# Patient Record
Sex: Female | Born: 1945
Health system: Southern US, Community
[De-identification: ages and names within clinical notes are randomized; demographics above are authoritative.]

## PROBLEM LIST (undated history)

## (undated) DIAGNOSIS — Z5189 Encounter for other specified aftercare: Secondary | ICD-10-CM

## (undated) DIAGNOSIS — M810 Age-related osteoporosis without current pathological fracture: Secondary | ICD-10-CM

## (undated) DIAGNOSIS — Z860100 Personal history of colon polyps, unspecified: Secondary | ICD-10-CM

## (undated) DIAGNOSIS — M1990 Unspecified osteoarthritis, unspecified site: Secondary | ICD-10-CM

## (undated) DIAGNOSIS — C4370 Malignant melanoma of unspecified lower limb, including hip: Secondary | ICD-10-CM

## (undated) DIAGNOSIS — T7840XA Allergy, unspecified, initial encounter: Secondary | ICD-10-CM

## (undated) DIAGNOSIS — K219 Gastro-esophageal reflux disease without esophagitis: Secondary | ICD-10-CM

## (undated) HISTORY — DX: Personal history of colonic polyps: Z86.010

## (undated) HISTORY — DX: Personal history of colon polyps, unspecified: Z86.0100

## (undated) HISTORY — PX: OTHER SURGICAL HISTORY: SHX169

## (undated) HISTORY — DX: Allergy, unspecified, initial encounter: T78.40XA

## (undated) HISTORY — DX: Encounter for other specified aftercare: Z51.89

## (undated) HISTORY — DX: Age-related osteoporosis without current pathological fracture: M81.0

## (undated) HISTORY — DX: Malignant melanoma of unspecified lower limb, including hip: C43.70

## (undated) HISTORY — PX: TOTAL ABDOMINAL HYSTERECTOMY W/ BILATERAL SALPINGOOPHORECTOMY: SHX83

## (undated) HISTORY — DX: Gastro-esophageal reflux disease without esophagitis: K21.9

## (undated) HISTORY — DX: Unspecified osteoarthritis, unspecified site: M19.90

---

## 2009-12-05 ENCOUNTER — Ambulatory Visit: Payer: Self-pay | Admitting: Family Medicine

## 2009-12-05 ENCOUNTER — Encounter: Payer: Self-pay | Admitting: Family Medicine

## 2009-12-05 DIAGNOSIS — Z860100 Personal history of colon polyps, unspecified: Secondary | ICD-10-CM | POA: Insufficient documentation

## 2009-12-05 DIAGNOSIS — IMO0002 Reserved for concepts with insufficient information to code with codable children: Secondary | ICD-10-CM | POA: Insufficient documentation

## 2009-12-05 DIAGNOSIS — Z85828 Personal history of other malignant neoplasm of skin: Secondary | ICD-10-CM | POA: Insufficient documentation

## 2009-12-05 DIAGNOSIS — K219 Gastro-esophageal reflux disease without esophagitis: Secondary | ICD-10-CM | POA: Insufficient documentation

## 2009-12-10 ENCOUNTER — Ambulatory Visit: Payer: Self-pay | Admitting: Family Medicine

## 2009-12-10 LAB — CONVERTED CEMR LAB
ALT: 16 units/L (ref 0–35)
BUN: 21 mg/dL (ref 6–23)
Bilirubin, Direct: 0.1 mg/dL (ref 0.0–0.3)
Calcium: 9.9 mg/dL (ref 8.4–10.5)
Cholesterol: 186 mg/dL (ref 0–200)
Creatinine, Ser: 0.8 mg/dL (ref 0.4–1.2)
Eosinophils Relative: 4.6 % (ref 0.0–5.0)
GFR calc non Af Amer: 78.78 mL/min (ref >60)
HDL: 51.2 mg/dL (ref >39.00)
LDL Cholesterol: 114 mg/dL — ABNORMAL HIGH (ref 0–99)
MCV: 94.6 fL (ref 78.0–100.0)
Monocytes Absolute: 0.4 10*3/uL (ref 0.1–1.0)
Monocytes Relative: 8.5 % (ref 3.0–12.0)
Neutrophils Relative %: 49.6 % (ref 43.0–77.0)
Platelets: 253 10*3/uL (ref 150.0–400.0)
Total Bilirubin: 0.4 mg/dL (ref 0.3–1.2)
Triglycerides: 103 mg/dL (ref 0.0–149.0)
VLDL: 20.6 mg/dL (ref 0.0–40.0)
WBC: 4.9 10*3/uL (ref 4.5–10.5)

## 2009-12-13 ENCOUNTER — Encounter: Payer: Self-pay | Admitting: Family Medicine

## 2009-12-18 ENCOUNTER — Telehealth (INDEPENDENT_AMBULATORY_CARE_PROVIDER_SITE_OTHER): Payer: Self-pay | Admitting: *Deleted

## 2009-12-31 ENCOUNTER — Encounter (INDEPENDENT_AMBULATORY_CARE_PROVIDER_SITE_OTHER): Payer: Self-pay | Admitting: *Deleted

## 2010-03-26 NOTE — Miscellaneous (Signed)
  Clinical Lists Changes  Observations: Added new observation of BONE DENSITY: osteoporosis (12/17/2009 9:09) Added new observation of MAMMOGRAM: normal (12/05/2009 9:09)      Preventive Care Screening  Bone Density:    Date:  12/17/2009    Results:  osteoporosis  Mammogram:    Date:  12/05/2009    Results:  normal

## 2010-03-26 NOTE — Progress Notes (Signed)
Summary: bone density  Phone Note Outgoing Call   Call placed by: Doristine Devoid CMA,  December 18, 2009 2:23 PM Call placed to: Patient Summary of Call: recieved copy of bone density report from [facility] and it's indicated that patient has osteoporosis and needs to start Fosamax 70mg  weekly and continue caltrate daily.  left message on machine ........Marland KitchenDoristine Devoid CMA  December 18, 2009 2:25 PM   Follow-up for Phone Call        spoke w/ patient isn't comfortable w/ taking to fosamax would like to try another option.......Marland KitchenDoristine Devoid CMA  December 18, 2009 3:48 PM   Additional Follow-up for Phone Call Additional follow up Details #1::        could do reclast if pt is interested...if she has concerns we can talk about this at an OV Additional Follow-up by: Neena Rhymes MD,  December 19, 2009 8:04 AM    Additional Follow-up for Phone Call Additional follow up Details #2::    spoke w/ patient is interested in doing reclast says that she will check with insurance to see what coverage information is and contact our office to proceed.........Marland KitchenDoristine Devoid CMA  December 19, 2009 10:47 AM

## 2010-03-26 NOTE — Assessment & Plan Note (Signed)
Summary: NEW TO ESTAB/CBS  Flu Vaccine Consent Questions     Do you have a history of severe allergic reactions to this vaccine? no    Any prior history of allergic reactions to egg and/or gelatin? no    Do you have a sensitivity to the preservative Thimersol? no    Do you have a past history of Guillan-Barre Syndrome? no    Do you currently have an acute febrile illness? no    Have you ever had a severe reaction to latex? no    Vaccine information given and explained to patient? yes    Are you currently pregnant? no    Lot Number:[id number]   Exp Date:08/24/2010   Site Given  Right Deltoid IM     Vital Signs:  Patient profile:   65 year old female Height:      61.50 inches Weight:      148 pounds BMI:     27.61 Pulse rate:   78 / minute BP sitting:   120 / 82  (right arm)  Vitals Entered By: Doristine Devoid CMA (December 05, 2009 10:03 AM) CC: NEW EST- CPX AND LABS    History of Present Illness: 65 yo woman here today to establish care.  Derm- [facility].    Here for Medicare AWV:  1.   Risk factors based on Past M, S, F history: Glaucoma- taking Lumagan and Timolol, sees Dr Burgess Estelle w/ [facility] Dyspareunia- sxs were well controlled on hormone replacement but then was weaned.  now has vaginal dryness and bleeding w/ intercourse.  is 'all greased up' w/out relief. Melanoma- seeing derm once yearly. 2.   Physical Activities: walks dogs regularly 3.   Depression/mood: denies 4.   Hearing: normal to whispered voice 5.   ADL's: independent 6.   Fall Risk: not at risk 7.   Home Safety: safe at home 8.   Height, weight, &visual acuity: see vitals, vision corrected by ophtho 9.   Counseling: provided on diet and exercise 10.   Labs ordered based on risk factors: see A&P 11.           Referral Coordination: see A&P 12.           Care Plan: see A&P 13.           Cognitive Assessment- AAOx3, thought process linear, able to manage finances, memory intact.  Preventive  Screening-Counseling & Management  Alcohol-Tobacco     Alcohol drinks/day: 1     Alcohol type: wine     Smoking Status: never  Caffeine-Diet-Exercise     Does Patient Exercise: no      Sexual History:  currently monogamous.        Drug Use:  never.    Current Medications (verified): 1)  Osteo Bi-Flex Adv Triple St  Tabs (Misc Natural Products) .... Take Once Daily 2)  Citracal/vitamin D 250-200 Mg-Unit Tabs (Calcium Citrate-Vitamin D) .... Take One Tablet Daily 3)  Acidophilus  Tabs (Lactobacillus) .... Take Once Daily 4)  Fish Oil Triple Strength 1360 Mg Caps (Omega-3 Fatty Acids) .... Take One Tablet Daily 5)  Lumigan 0.03 % Soln (Bimatoprost) .... One Drop Each Eye Daily 6)  Timoptic 0.25 % Soln (Timolol Maleate) .[name].. 1 Drop in Left Eye 7)  Centrum Silver  Tabs (Multiple Vitamins-Minerals) .... Take One Tablet Daily  Allergies (verified): 1)  ! Amoxicillin  Past History:  Past Medical History: Skin cancer, hx of- melanoma on thigh GERD Glaucoma Colonic polyps, hx  of-   Past Surgical History: Melanoma removed Colon polyps removed  Hysterectomy Oophorectomy  Family History: CAD-no HTN-mother DM-no COLON CA-no STROKE-no BREAST CA-mother dx:83  Social History: Retired Married, daughter Never Smoked Alcohol use-yes Smoking Status:  never Does Patient Exercise:  no Sexual History:  currently monogamous Drug Use:  never  Review of Systems  The patient denies anorexia, fever, weight loss, weight gain, vision loss, decreased hearing, hoarseness, chest pain, syncope, dyspnea on exertion, peripheral edema, prolonged cough, headaches, abdominal pain, melena, hematochezia, severe indigestion/heartburn, hematuria, suspicious skin lesions, depression, abnormal bleeding, enlarged lymph nodes, and breast masses.    Physical Exam  General:  Well-developed,well-nourished,in no acute distress; alert,appropriate and cooperative throughout examination Head:   Normocephalic and atraumatic without obvious abnormalities. No apparent alopecia or balding. Eyes:  deferred to ophtho Ears:  External ear exam shows no significant lesions or deformities.  Otoscopic examination reveals clear canals, tympanic membranes are intact bilaterally without bulging, retraction, inflammation or discharge. Hearing is grossly normal bilaterally. Nose:  External nasal examination shows no deformity or inflammation. Nasal mucosa are pink and moist without lesions or exudates. Mouth:  Oral mucosa and oropharynx without lesions or exudates.  Teeth in good repair. Neck:  No deformities, masses, or tenderness noted. Breasts:  No mass, nodules, thickening, tenderness, bulging, retraction, inflamation, nipple discharge or skin changes noted.   Lungs:  Normal respiratory effort, chest expands symmetrically. Lungs are clear to auscultation, no crackles or wheezes. Heart:  Normal rate and regular rhythm. S1 and S2 normal without gallop, murmur, click, rub or other extra sounds. Abdomen:  Bowel sounds positive,abdomen soft and non-tender without masses, organomegaly or hernias noted. Genitalia:  vaginal atrophy.   Pulses:  +2 carotid, radial, DP Extremities:  No clubbing, cyanosis, edema, or deformity noted with normal full range of motion of all joints.   Neurologic:  No cranial nerve deficits noted. Station and gait are normal. Plantar reflexes are down-going bilaterally. DTRs are symmetrical throughout. Sensory, motor and coordinative functions appear intact. Skin:  Intact without suspicious lesions or rashes Cervical Nodes:  No lymphadenopathy noted Axillary Nodes:  No palpable lymphadenopathy Psych:  Cognition and judgment appear intact. Alert and cooperative with normal attention span and concentration. No apparent delusions, illusions, hallucinations   Impression & Recommendations:  Problem # 1:  PHYSICAL EXAMINATION (ICD-V70.0) Assessment New pt's PE WNL.  check labs to  screen for lipid d/o, DM, anemia, thyroid abnormalities.  check EKG as baseline.  anticipatory guidance provided.  will refer for DEXA and mammogram. Orders: Venipuncture (10272) TLB-Lipid Panel (80061-LIPID) TLB-BMP (Basic Metabolic Panel-BMET) (80048-METABOL) TLB-CBC Platelet - w/Differential (85025-CBCD) TLB-Hepatic/Liver Function Pnl (80076-HEPATIC) TLB-TSH (Thyroid Stimulating Hormone) (84443-TSH) EKG w/ Interpretation (93000) Welcome to Medicare, Physical (Z3664)  Problem # 2:  DYSPAREUNIA (ICD-625.0) Assessment: New most likely due to pt's vaginal atrophy.  start premarin cream.  reviewed side effects, including increased risk of breast cancer.  pt willing to accept these risks.  will follow.  Problem # 3:  SKIN CANCER, HX OF (ICD-V10.83) Assessment: New hx of melanoma.  following regularly w/ derm.  Complete Medication List: 1)  Osteo Bi-flex Adv Triple St Tabs (Misc natural products) .... Take once daily 2)  Citracal/vitamin D 250-200 Mg-unit Tabs (Calcium citrate-vitamin d) .... Take one tablet daily 3)  Acidophilus Tabs (Lactobacillus) .... Take once daily 4)  Fish Oil Triple Strength 1360 Mg Caps (Omega-3 fatty acids) .... Take one tablet daily 5)  Lumigan 0.03 % Soln (Bimatoprost) .... One drop each eye daily 6)  Timoptic 0.25 % Soln (Timolol maleate) .[name].. 1 drop in left eye 7)  Centrum Silver Tabs (Multiple vitamins-minerals) .... Take one tablet daily 8)  Premarin 0.625 Mg/gm Crea (Estrogens, conjugated) .... Apply 0.5 grams daily x2 weeks and then decrease to twice weekly.  disp 1 tube  Other Orders: Radiology Referral (Radiology) Flu Vaccine 56yrs + MEDICARE PATIENTS (G3875) Administration Flu vaccine - MCR (I4332)  Patient Instructions: 1)  Follow up in 2-3 months to let me know how the dryness is 2)  Use the Premarin as directed 3)  Someone will call you with your mammogram and bone density appts 4)  Keep up the good work!  You look great! 5)  We'll notify you  of your lab results 6)  Call with any questions or concerns 7)  Welcome!  We're glad to have you! Prescriptions: PREMARIN 0.625 MG/GM CREA (ESTROGENS, CONJUGATED) apply 0.5 grams daily x2 weeks and then decrease to twice weekly.  disp 1 tube  #1 x 3   Entered and Authorized by:   Neena Rhymes MD   Signed by:   Neena Rhymes MD on 12/05/2009   Method used:   Print then Give to Patient   RxID:   [id number]

## 2010-11-04 ENCOUNTER — Encounter: Payer: Self-pay | Admitting: Family Medicine

## 2010-11-04 ENCOUNTER — Ambulatory Visit (INDEPENDENT_AMBULATORY_CARE_PROVIDER_SITE_OTHER): Payer: Medicare Other | Admitting: Family Medicine

## 2010-11-04 DIAGNOSIS — Z23 Encounter for immunization: Secondary | ICD-10-CM

## 2010-11-04 DIAGNOSIS — IMO0002 Reserved for concepts with insufficient information to code with codable children: Secondary | ICD-10-CM

## 2010-11-04 DIAGNOSIS — G5790 Unspecified mononeuropathy of unspecified lower limb: Secondary | ICD-10-CM

## 2010-11-04 DIAGNOSIS — M542 Cervicalgia: Secondary | ICD-10-CM

## 2010-11-04 MED ORDER — CYCLOBENZAPRINE HCL 10 MG PO TABS: 10.0000 mg | ORAL_TABLET | Freq: Three times a day (TID) | ORAL | Status: DC | PRN

## 2010-11-04 MED ORDER — NAPROXEN 500 MG PO TABS: 500.0000 mg | ORAL_TABLET | Freq: Two times a day (BID) | ORAL | Status: DC

## 2010-11-04 NOTE — Progress Notes (Signed)
  Subjective:    Patient ID: Denise Ewing, female    DOB: June 14, 1945, 65 y.o.   MRN: [id number]  HPI Neck pain- started in February after flying to [place].  Went to [facility] and sxs did not improve.  Now they have worsened.  Pain is 'right here'- pt outlines trapezius muscles bilaterally.  Limited motion due to 'pulling'.  Burning pain- occuring in L foot, rarely in R foot.  Will occur after prolonged sitting/sleeping.  sxs are most noticeable on the tops of her feet.  Pain is not shooting from back or down back of thigh.  Dyspareunia- still having pain and bleeding despite using estrogen.  Wants to increase dose.   Review of Systems For ROS see HPI     Objective:   Physical Exam  Vitals reviewed. Constitutional: She is oriented to person, place, and time. She appears well-developed and well-nourished. No distress.  HENT:  Head: Normocephalic and atraumatic.       TMs normal bilaterally No TTP over sinuses  Eyes: Conjunctivae and EOM are normal. Pupils are equal, round, and reactive to light.  Musculoskeletal: She exhibits tenderness (over trapezesius bilaterally, + spasm).       (-) SLR bilaterally DTRs normal bilaterally Sensation of LEs intact Gait normal Strength of LEs symmetric and normal  Neurological: She is alert and oriented to person, place, and time. She has normal reflexes. No cranial nerve deficit. Coordination normal.          Assessment & Plan:

## 2010-11-04 NOTE — Patient Instructions (Signed)
Schedule your complete physical for October- do not eat before this appt Start the Naprosyn- twice daily x10 days and then as needed- take w/ food to avoid upset stomach Use the flexeril before bed Use a heating pad for pain relief Get a massage if able If the pain in your feet is not better in 2 weeks- please call Call with any questions or concerns Hang in there!

## 2010-11-10 NOTE — Assessment & Plan Note (Signed)
Pt's sxs and PE consistent w/ bilateral trap spasm.  Start scheduled NSAIDs and muscle relaxers.  Heat and massage as able.  Reviewed supportive care and red flags that should prompt return.  Pt expressed understanding and is in agreement w/ plan.

## 2010-11-10 NOTE — Assessment & Plan Note (Signed)
Pain pt is describing is nerve related.  Since it occurs w/ prolonged sitting it sounds as if nerve is being compressed by position.  Pt to monitor sxs and if no improvement will refer for nerve conduction study.  Pt expressed understanding and is in agreement w/ plan.

## 2010-11-10 NOTE — Assessment & Plan Note (Signed)
Refer to gyn for complete evaluation and tx.  Hesitant to increase estrogen dose due to risk factors- including breast cancer.  Pt in agreement w/ referral.

## 2010-11-28 ENCOUNTER — Other Ambulatory Visit (HOSPITAL_COMMUNITY)
Admission: RE | Admit: 2010-11-28 | Discharge: 2010-11-28 | Disposition: A | Discharge status: Home / Self Care | Payer: Medicare Other | Source: Ambulatory Visit | Attending: Obstetrics and Gynecology | Admitting: Obstetrics and Gynecology

## 2010-11-28 ENCOUNTER — Other Ambulatory Visit: Payer: Self-pay | Admitting: Obstetrics and Gynecology

## 2010-11-28 DIAGNOSIS — Z124 Encounter for screening for malignant neoplasm of cervix: Secondary | ICD-10-CM | POA: Insufficient documentation

## 2010-12-23 ENCOUNTER — Ambulatory Visit (INDEPENDENT_AMBULATORY_CARE_PROVIDER_SITE_OTHER): Payer: Medicare Other | Admitting: Family Medicine

## 2010-12-23 ENCOUNTER — Encounter: Payer: Self-pay | Admitting: Family Medicine

## 2010-12-23 DIAGNOSIS — Z79899 Other long term (current) drug therapy: Secondary | ICD-10-CM

## 2010-12-23 DIAGNOSIS — R5381 Other malaise: Secondary | ICD-10-CM

## 2010-12-23 DIAGNOSIS — Z0000 Encounter for general adult medical examination without abnormal findings: Secondary | ICD-10-CM | POA: Insufficient documentation

## 2010-12-23 DIAGNOSIS — K219 Gastro-esophageal reflux disease without esophagitis: Secondary | ICD-10-CM

## 2010-12-23 DIAGNOSIS — R5383 Other fatigue: Secondary | ICD-10-CM | POA: Insufficient documentation

## 2010-12-23 LAB — BASIC METABOLIC PANEL
Chloride: 103 mEq/L (ref 96–112)
GFR: 72.09 mL/min (ref >60.00)
Glucose, Bld: 98 mg/dL (ref 70–99)
Potassium: 4.5 mEq/L (ref 3.5–5.1)
Sodium: 138 mEq/L (ref 135–145)

## 2010-12-23 LAB — LIPID PANEL
LDL Cholesterol: 123 mg/dL — ABNORMAL HIGH (ref 0–99)
Total CHOL/HDL Ratio: 4
Triglycerides: 121 mg/dL (ref 0.0–149.0)

## 2010-12-23 LAB — CBC WITH DIFFERENTIAL/PLATELET
Eosinophils Absolute: 0.2 10*3/uL (ref 0.0–0.7)
Eosinophils Relative: 4.2 % (ref 0.0–5.0)
Lymphocytes Relative: 33.5 % (ref 12.0–46.0)
MCV: 92.3 fl (ref 78.0–100.0)
Monocytes Absolute: 0.4 10*3/uL (ref 0.1–1.0)
Neutrophils Relative %: 54.1 % (ref 43.0–77.0)
Platelets: 260 10*3/uL (ref 150.0–400.0)
WBC: 5.4 10*3/uL (ref 4.5–10.5)

## 2010-12-23 LAB — HEPATIC FUNCTION PANEL
ALT: 17 U/L (ref 0–35)
AST: 59 U/L — ABNORMAL HIGH (ref 0–37)
Alkaline Phosphatase: 68 U/L (ref 39–117)
Bilirubin, Direct: 0 mg/dL (ref 0.0–0.3)
Total Protein: 7.3 g/dL (ref 6.0–8.3)

## 2010-12-23 NOTE — Patient Instructions (Signed)
You look great!  Keep up the good work! We'll notify you of your lab results Call your insurance and ask about the shingles vaccine Call with any questions or concerns Happy Holidays!

## 2010-12-23 NOTE — Progress Notes (Signed)
  Subjective:    Patient ID: Denise Ewing, female    DOB: 1945/12/08, 65 y.o.   MRN: [id number]  HPI Here today for CPE.  Risk Factors: GERD- reports sxs are excellently controlled on Prilosec Fatigue- increased recently, would like to have B12 level checked.  Reports AM fatigue, afternoon fatigue.  Denies change in sleeping habits. Physical Activity: very active, does treadmill daily Fall Risk: low risk Depression: denies depression Hearing: normal to conversational tones, whispered voice ADL's: indpendent Cognitive: memory intact, linear thought process, normal attention span Home Safety: safe at home, lives w/ husband. Height, Weight, BMI, Visual Acuity: see vitals, vision corrected to 20/20 w/ glasses Counseling: UTD on mammo, DEXA, GYN exam [facility]), colonoscopy (2009) Labs Ordered: See A&P Care Plan: See A&P    Review of Systems Patient reports no vision/ hearing changes, adenopathy,fever, weight change,  persistant/recurrent hoarseness , swallowing issues, chest pain, palpitations, edema, persistant/recurrent cough, hemoptysis, dyspnea (rest/exertional/paroxysmal nocturnal), gastrointestinal bleeding (melena, rectal bleeding), abdominal pain, significant heartburn, bowel changes, GU symptoms (dysuria, hematuria, incontinence), Gyn symptoms (abnormal  bleeding, pain),  syncope, focal weakness, memory loss, numbness & tingling, skin/hair/nail changes, abnormal bruising or bleeding, anxiety, or depression.     Objective:   Physical Exam  General Appearance:    Alert, cooperative, no distress, appears stated age  Head:    Normocephalic, without obvious abnormality, atraumatic  Eyes:    PERRL, conjunctiva/corneas clear, EOM's intact, fundi    benign, both eyes  Ears:    Normal TM's and external ear canals, both ears  Nose:   Nares normal, septum midline, mucosa normal, no drainage    or sinus tenderness  Throat:   Lips, mucosa, and tongue normal; teeth and gums normal  Neck:    Supple, symmetrical, trachea midline, no adenopathy;    Thyroid: no enlargement/tenderness/nodules  Back:     Symmetric, no curvature, ROM normal, no CVA tenderness  Lungs:     Clear to auscultation bilaterally, respirations unlabored  Chest Wall:    No tenderness or deformity   Heart:    Regular rate and rhythm, S1 and S2 normal, no murmur, rub   or gallop  Breast Exam:    Deferred to GYN  Abdomen:     Soft, non-tender, bowel sounds active all four quadrants,    no masses, no organomegaly  Genitalia:    Deferred to GYN  Rectal:    Extremities:   Extremities normal, atraumatic, no cyanosis or edema  Pulses:   2+ and symmetric all extremities  Skin:   Skin color, texture, turgor normal, no rashes or lesions  Lymph nodes:   Cervical, supraclavicular, and axillary nodes normal  Neurologic:   CNII-XII intact, normal strength, sensation and reflexes    throughout          Assessment & Plan:

## 2010-12-25 ENCOUNTER — Telehealth: Payer: Self-pay | Admitting: *Deleted

## 2010-12-25 LAB — VITAMIN D 1,25 DIHYDROXY: Vitamin D2 1, 25 (OH)2: <8 pg/mL

## 2010-12-25 NOTE — Telephone Encounter (Signed)
Called pt. Left message to contact office

## 2010-12-25 NOTE — Telephone Encounter (Signed)
Message copied by Ovidio Kin on Wed Dec 25, 2010  6:02 PM ------      Message from: Sheliah Hatch      Created: Mon Dec 23, 2010  9:04 PM       Labs look good w/ exception of mildly elevated liver function.  Please hold tylenol and ETOH x2 weeks and then repeat LFTs (dx abnormal liver fxn)

## 2010-12-27 ENCOUNTER — Encounter: Payer: Self-pay | Admitting: *Deleted

## 2010-12-28 NOTE — Assessment & Plan Note (Signed)
Chronic problem, well controlled on Omeprazole.  No changes.

## 2010-12-28 NOTE — Assessment & Plan Note (Signed)
Pt's PE WNL.  UTD on health maintenance.  Check labs.  Anticipatory guidance provided.  

## 2010-12-28 NOTE — Assessment & Plan Note (Signed)
Pt reports this is new.  Check labs to r/o thyroid, anemia, metabolic abnormality.  Will determine next step based on lab results

## 2011-01-02 ENCOUNTER — Other Ambulatory Visit: Payer: Self-pay | Admitting: Family Medicine

## 2011-01-02 MED ORDER — CYCLOBENZAPRINE HCL 10 MG PO TABS: 10.0000 mg | ORAL_TABLET | Freq: Three times a day (TID) | ORAL | Status: DC | PRN

## 2011-01-02 NOTE — Telephone Encounter (Signed)
Ok for #30 

## 2011-01-02 NOTE — Telephone Encounter (Signed)
Last OV 12-23-10 last refill 11-04-10

## 2011-01-02 NOTE — Telephone Encounter (Signed)
rx sent to pharmacy by e-script For flexerill

## 2011-01-09 ENCOUNTER — Other Ambulatory Visit: Payer: Self-pay | Admitting: Family Medicine

## 2011-01-09 DIAGNOSIS — Z0000 Encounter for general adult medical examination without abnormal findings: Secondary | ICD-10-CM

## 2011-01-10 ENCOUNTER — Other Ambulatory Visit (INDEPENDENT_AMBULATORY_CARE_PROVIDER_SITE_OTHER): Payer: Medicare Other

## 2011-01-10 DIAGNOSIS — Z0000 Encounter for general adult medical examination without abnormal findings: Secondary | ICD-10-CM

## 2011-01-10 LAB — HEPATIC FUNCTION PANEL
AST: 51 U/L — ABNORMAL HIGH (ref 0–37)
Albumin: 4.3 g/dL (ref 3.5–5.2)
Alkaline Phosphatase: 62 U/L (ref 39–117)
Total Protein: 7.2 g/dL (ref 6.0–8.3)

## 2011-01-10 NOTE — Progress Notes (Signed)
12  

## 2011-03-24 ENCOUNTER — Telehealth: Payer: Self-pay | Admitting: Family Medicine

## 2011-03-24 MED ORDER — CYCLOBENZAPRINE HCL 10 MG PO TABS: 10.0000 mg | ORAL_TABLET | Freq: Three times a day (TID) | ORAL | Status: DC | PRN

## 2011-03-24 NOTE — Telephone Encounter (Signed)
rx sent to pharmacy by e-script  

## 2011-03-24 NOTE — Telephone Encounter (Signed)
Refill- cyclobenzaprine 10mg  tablets. Take one tablet by mouth every 8 hrs as needed for muscle spasms. Qty 30 last fill 11.8.12

## 2011-08-04 ENCOUNTER — Telehealth: Payer: Self-pay | Admitting: Family Medicine

## 2011-08-04 NOTE — Telephone Encounter (Signed)
Refill: Cyclobenzaprine 10mg  tablets. Take 1 tablet by mouth every 8 hours as needed for muscle spasms. Qty 30. Last fill 03-24-11

## 2011-08-05 MED ORDER — CYCLOBENZAPRINE HCL 10 MG PO TABS: 10.0000 mg | ORAL_TABLET | Freq: Three times a day (TID) | ORAL | Status: DC | PRN

## 2011-08-05 NOTE — Telephone Encounter (Signed)
Ok for #30 

## 2011-08-05 NOTE — Telephone Encounter (Signed)
rx sent to pharmacy by e-script  

## 2011-08-05 NOTE — Telephone Encounter (Signed)
Last OV 12-23-11 last refill 03-24-11 #30 no refills

## 2011-08-29 ENCOUNTER — Ambulatory Visit (INDEPENDENT_AMBULATORY_CARE_PROVIDER_SITE_OTHER): Payer: Medicare Other | Admitting: Internal Medicine

## 2011-08-29 ENCOUNTER — Encounter: Payer: Self-pay | Admitting: Internal Medicine

## 2011-08-29 ENCOUNTER — Ambulatory Visit (HOSPITAL_BASED_OUTPATIENT_CLINIC_OR_DEPARTMENT_OTHER)
Admission: RE | Admit: 2011-08-29 | Discharge: 2011-08-29 | Disposition: A | Discharge status: Home / Self Care | Payer: Medicare Other | Source: Ambulatory Visit | Attending: Internal Medicine | Admitting: Internal Medicine

## 2011-08-29 VITALS — BP 138/82 | HR 83 | Temp 98.5°F | Wt 149.2 lb

## 2011-08-29 DIAGNOSIS — M51379 Other intervertebral disc degeneration, lumbosacral region without mention of lumbar back pain or lower extremity pain: Secondary | ICD-10-CM | POA: Insufficient documentation

## 2011-08-29 DIAGNOSIS — IMO0002 Reserved for concepts with insufficient information to code with codable children: Secondary | ICD-10-CM

## 2011-08-29 DIAGNOSIS — M5416 Radiculopathy, lumbar region: Secondary | ICD-10-CM

## 2011-08-29 DIAGNOSIS — M25559 Pain in unspecified hip: Secondary | ICD-10-CM | POA: Insufficient documentation

## 2011-08-29 DIAGNOSIS — M5137 Other intervertebral disc degeneration, lumbosacral region: Secondary | ICD-10-CM | POA: Insufficient documentation

## 2011-08-29 MED ORDER — GABAPENTIN 100 MG PO CAPS: ORAL_CAPSULE | ORAL | Status: DC

## 2011-08-29 NOTE — Patient Instructions (Addendum)
The best exercises for the low back include freestyle swimming, stretch aerobics, and yoga.  Order for x-rays entered into  the computer; these will be performed at  [facility]. No appointment is necessary.    Please try to go on My Chart within the next 24 hours to allow me to release the results directly to you.

## 2011-08-29 NOTE — Progress Notes (Signed)
Subjective:    Patient ID: Denise Ewing, female    DOB: 1945/07/19, 66 y.o.   MRN: [id number]  HPI One month ago she began to have pain in the left hip, aching to sharp especially climbing stairs. There was no associated trauma or trigger for the back pain. In the last several days she has had sharp pain in the left inguinal area with ambulation. Sitting or leaning forward may be of some benefit. She had similar symptoms in the back in 2009 prior to having a benign tumor removed from the uterus. At that time showed total bowel hysterectomy and bilateral salpingo-oophorectomy.       Review of Systems Fecal/urinary incontinence: no Nmbness/Weakness: She has intermittent tingling in the left thigh or in the left posterior calf. Fever/chills/sweats: no Night pain: occasionally, but the pain is mainly with ambulation Weight loss: up 5# h/o cancer/immunosuppression:melanoma in situ R thigh; fibroid was benign PMH of osteoporosis or chronic steroid use:no       Objective:   Physical Exam Gen.: Healthy and well-nourished in appearance. Alert, appropriate and cooperative throughout exam. Head: Normocephalic without obvious abnormalities Neck: No deformities, masses, or tenderness noted. Range of motion normal Lungs: Normal respiratory effort; chest expands symmetrically. Lungs are clear to auscultation without rales, wheezes, or increased work of breathing. Heart: Normal rate and rhythm. Normal S1 and S2. No gallop, click, or rub.Grade 1/2 over 6 systolic murmur  Abdomen: Bowel sounds normal; abdomen soft and nontender. No masses, organomegaly or hernias noted.                                                     Musculoskeletal/extremities: No deformity or scoliosis noted of  the thoracic or lumbar spine. No clubbing, cyanosis, edema noted. Range of motion  normal .Tone & strength  normal. She has minor DIP finger changes; bunion is present at the base of the right great toe. Nail health  good.  She is able to lie flat and sit up without help. Straight leg raising is normal bilaterally to 90 Vascular: Carotid, radial artery, dorsalis pedis and  posterior tibial pulses are full and equal. No bruits present. Neurologic: Alert and oriented x3. Deep tendon reflexes symmetrical and normal.  Gait is normal to include tiptoe and heel walking        Skin: Intact without suspicious lesions or rashes. There is a well-healed scar over the right anterior thigh Lymph: No cervical, axillary, or L inguinal lymphadenopathy present. Psych: Mood and affect are normal. Normally interactive                                                                                       Assessment & Plan:  #1 back and hip pain; exam mitigate against acute disc or hip issues. The history suggests an L1-L2 radiculopathy. The cath symptoms would suggest an L 4-5 involvement. Past medical history of similar back pain with uterine fibroids. Status post total abdominal hysterectomy  #2 past medical history localized melanoma right  thigh  Plan: Lumbar films and  Gabapentin trial

## 2011-09-05 ENCOUNTER — Encounter: Payer: Self-pay | Admitting: Family Medicine

## 2011-09-05 ENCOUNTER — Ambulatory Visit (INDEPENDENT_AMBULATORY_CARE_PROVIDER_SITE_OTHER): Payer: Medicare Other | Admitting: Family Medicine

## 2011-09-05 VITALS — BP 120/67 | HR 70 | Temp 97.7°F | Ht 62.0 in | Wt 148.0 lb

## 2011-09-05 DIAGNOSIS — Z23 Encounter for immunization: Secondary | ICD-10-CM

## 2011-09-05 DIAGNOSIS — IMO0002 Reserved for concepts with insufficient information to code with codable children: Secondary | ICD-10-CM

## 2011-09-05 DIAGNOSIS — M5416 Radiculopathy, lumbar region: Secondary | ICD-10-CM | POA: Insufficient documentation

## 2011-09-05 MED ORDER — NAPROXEN 500 MG PO TABS: 500.0000 mg | ORAL_TABLET | Freq: Two times a day (BID) | ORAL | Status: AC

## 2011-09-05 NOTE — Progress Notes (Signed)
  Subjective:    Patient ID: Denise Ewing, female    DOB: 1945-12-06, 66 y.o.   MRN: [id number]  HPI Groin pain- sxs started 1 month ago.  After prolonged sitting would stand and get pain in low back that would radiate down into leg.  Would hurt to walk but would then ease w/ continued walking.  Pain is now radiating into groin.  Described as sharp pain.  In groin.  Becoming more frequent.  Fears cancer recurrence- melanoma on thigh.  Started on Neurontin at last visit- 'takes it from a headache to a dull ache but it's still there'.  Had lumbar films showing some degenerative changes.  Has had intermittent numbness in L leg- radiating from hip to toes.  No incontinence.   Review of Systems For ROS see HPI     Objective:   Physical Exam  Vitals reviewed. Constitutional: She appears well-developed and well-nourished. No distress.  Cardiovascular: Intact distal pulses.   Musculoskeletal:       Good flexion and extension of spine + TTP over L SI joint + SLR on L, (-) on R Pain w/ external rotation of L hip, no pain w/ internal rotation, flexion/extension          Assessment & Plan:

## 2011-09-05 NOTE — Assessment & Plan Note (Signed)
New.  Recent xrays show degenerative changes of lumbar spine and pt w/ TTP over L SI joint.  neurontin has improved the neuropathic pain.  Start NSAIDs to decrease inflammation.  Refer to ortho for complete evaluation.  Reviewed supportive care and red flags that should prompt return.  Pt expressed understanding and is in agreement w/ plan.

## 2011-09-05 NOTE — Patient Instructions (Addendum)
Start the Naproxen twice daily x10 days (w/ food) to decrease inflammation Continue the neurontin We'll call you with your Ortho appt Call with any questions or concerns Hang in there!!!

## 2011-11-05 ENCOUNTER — Other Ambulatory Visit: Payer: Self-pay | Admitting: Family Medicine

## 2011-11-05 NOTE — Telephone Encounter (Signed)
Refill Cyclobenzaprine HCl (Tab) 10 MG Take 1 tablet (10 mg total) by mouth every 8 (eight) hours as needed for muscle spasms--last fill 6.11.13 #30 Last ov 7.12.13 acute

## 2011-11-06 MED ORDER — CYCLOBENZAPRINE HCL 10 MG PO TABS: 10.0000 mg | ORAL_TABLET | Freq: Three times a day (TID) | ORAL | Status: AC | PRN

## 2011-11-06 NOTE — Telephone Encounter (Signed)
rx sent to pharmacy by e-script  

## 2011-11-06 NOTE — Telephone Encounter (Signed)
Ok for #30, no refills 

## 2011-11-06 NOTE — Telephone Encounter (Signed)
Last OV 09-05-11 last refill 03-24-11 #30 no refills

## 2011-12-16 ENCOUNTER — Ambulatory Visit (INDEPENDENT_AMBULATORY_CARE_PROVIDER_SITE_OTHER): Payer: Medicare Other

## 2011-12-16 DIAGNOSIS — Z23 Encounter for immunization: Secondary | ICD-10-CM

## 2011-12-26 ENCOUNTER — Encounter: Payer: Self-pay | Admitting: Family Medicine

## 2012-02-06 ENCOUNTER — Encounter: Payer: Self-pay | Admitting: Family Medicine

## 2012-02-06 ENCOUNTER — Ambulatory Visit (INDEPENDENT_AMBULATORY_CARE_PROVIDER_SITE_OTHER): Payer: Medicare Other | Admitting: Family Medicine

## 2012-02-06 ENCOUNTER — Encounter: Payer: Self-pay | Admitting: Lab

## 2012-02-06 VITALS — BP 130/70 | HR 71 | Temp 98.2°F | Ht 61.25 in | Wt 148.4 lb

## 2012-02-06 DIAGNOSIS — Z0000 Encounter for general adult medical examination without abnormal findings: Secondary | ICD-10-CM | POA: Insufficient documentation

## 2012-02-06 DIAGNOSIS — R5381 Other malaise: Secondary | ICD-10-CM

## 2012-02-06 DIAGNOSIS — R5383 Other fatigue: Secondary | ICD-10-CM

## 2012-02-06 LAB — CBC WITH DIFFERENTIAL/PLATELET
Basophils Relative: 0.8 % (ref 0.0–3.0)
Eosinophils Absolute: 0.2 10*3/uL (ref 0.0–0.7)
Eosinophils Relative: 3.6 % (ref 0.0–5.0)
HCT: 40 % (ref 36.0–46.0)
Hemoglobin: 13.5 g/dL (ref 12.0–15.0)
Lymphs Abs: 2 10*3/uL (ref 0.7–4.0)
MCHC: 33.8 g/dL (ref 30.0–36.0)
MCV: 90.5 fl (ref 78.0–100.0)
Monocytes Absolute: 0.4 10*3/uL (ref 0.1–1.0)
Neutro Abs: 3.6 10*3/uL (ref 1.4–7.7)
Neutrophils Relative %: 56.9 % (ref 43.0–77.0)
RBC: 4.42 Mil/uL (ref 3.87–5.11)
WBC: 6.3 10*3/uL (ref 4.5–10.5)

## 2012-02-06 LAB — HEPATIC FUNCTION PANEL
AST: 43 U/L — ABNORMAL HIGH (ref 0–37)
Albumin: 4.8 g/dL (ref 3.5–5.2)
Total Bilirubin: 0.9 mg/dL (ref 0.3–1.2)

## 2012-02-06 LAB — LIPID PANEL
Cholesterol: 177 mg/dL (ref 0–200)
HDL: 46.4 mg/dL (ref >39.00)

## 2012-02-06 LAB — BASIC METABOLIC PANEL
BUN: 14 mg/dL (ref 6–23)
Creatinine, Ser: 0.8 mg/dL (ref 0.4–1.2)
GFR: 79.44 mL/min (ref >60.00)
Glucose, Bld: 105 mg/dL — ABNORMAL HIGH (ref 70–99)
Potassium: 3.9 mEq/L (ref 3.5–5.1)

## 2012-02-06 LAB — TSH: TSH: 2.4 u[IU]/mL (ref 0.35–5.50)

## 2012-02-06 NOTE — Assessment & Plan Note (Signed)
Pt's PE WNL.  UTD on health maintenance.  Check labs.  Anticipatory guidance provided.  

## 2012-02-06 NOTE — Progress Notes (Signed)
  Subjective:    Patient ID: Denise Ewing, female    DOB: 1946-02-10, 66 y.o.   MRN: [id number]  HPI Here today for CPE.  Risk Factors: GERD- chronic problem, well controlled on omeprazole daily. Lumbar back pain- chronic problem, dx'd w/ arthritis.  Will take NSAIDs and flexeril as needed.  Did PT w/out relief. Physical Activity: limited exercise, previously walked on the treadmill Fall Risk: low risk Depression: no current sxs Hearing: normal to conversational tones and whispered voice at 6 ft ADL's: independent Cognitive: normal linear thought process, memory and attention intact Home Safety: safe at home, lives w/ husband Height, Weight, BMI, Visual Acuity: see vitals, vision corrected to 20/20 w/ glasses Counseling: UTD on colonoscopy, mammo/pap. Labs Ordered: See A&P Care Plan: See A&P    Review of Systems Patient reports no vision/ hearing changes, adenopathy,fever, weight change,  persistant/recurrent hoarseness , swallowing issues, chest pain, palpitations, edema, persistant/recurrent cough, hemoptysis, dyspnea (rest/exertional/paroxysmal nocturnal), gastrointestinal bleeding (melena, rectal bleeding), abdominal pain, significant heartburn, bowel changes, GU symptoms (dysuria, hematuria, incontinence), Gyn symptoms (abnormal  bleeding, pain),  syncope, focal weakness, memory loss, numbness & tingling, skin/hair/nail changes, abnormal bruising or bleeding, anxiety, or depression.     Objective:   Physical Exam General Appearance:    Alert, cooperative, no distress, appears stated age  Head:    Normocephalic, without obvious abnormality, atraumatic  Eyes:    PERRL, conjunctiva/corneas clear, EOM's intact, fundi    benign, both eyes  Ears:    Normal TM's and external ear canals, both ears  Nose:   Nares normal, septum midline, mucosa normal, no drainage    or sinus tenderness  Throat:   Lips, mucosa, and tongue normal; teeth and gums normal  Neck:   Supple, symmetrical,  trachea midline, no adenopathy;    Thyroid: no enlargement/tenderness/nodules  Back:     Symmetric, no curvature, ROM normal, no CVA tenderness  Lungs:     Clear to auscultation bilaterally, respirations unlabored  Chest Wall:    No tenderness or deformity   Heart:    Regular rate and rhythm, S1 and S2 normal, no murmur, rub   or gallop  Breast Exam:    Deferred to GYN  Abdomen:     Soft, non-tender, bowel sounds active all four quadrants,    no masses, no organomegaly  Genitalia:    Deferred to GYN  Rectal:    Extremities:   Extremities normal, atraumatic, no cyanosis or edema  Pulses:   2+ and symmetric all extremities  Skin:   Skin color, texture, turgor normal, no rashes or lesions  Lymph nodes:   Cervical, supraclavicular, and axillary nodes normal  Neurologic:   CNII-XII intact, normal strength, sensation and reflexes    throughout          Assessment & Plan:

## 2012-02-06 NOTE — Patient Instructions (Addendum)
Follow up in 1 year or as needed We'll notify you of your lab results and make any changes if needed Keep up the good work!  You look great! Call with any questions or concerns Have a safe trip- Happy Holidays!!!

## 2012-02-11 LAB — VITAMIN D 1,25 DIHYDROXY
Vitamin D 1, 25 (OH)2 Total: 51 pg/mL (ref 18–72)
Vitamin D3 1, 25 (OH)2: 51 pg/mL

## 2012-03-29 ENCOUNTER — Telehealth: Payer: Self-pay | Admitting: Family Medicine

## 2012-03-29 NOTE — Telephone Encounter (Signed)
Message copied by Verner Chol on Mon Mar 29, 2012  4:09 PM ------      Message from: Elwin Sleight      Created: Fri Mar 26, 2012  3:20 PM      Regarding: RE: billing Issue      Contact: [phone]       Thank you      ----- Message -----         From: Theodis Sato McDaniels         Sent: 03/10/2012   3:02 PM           To: Elwin Sleight      Subject: billing Issue                                            Pt has been taking care of, she had questions as to why her [employer] would not pay for her Hepatic & TSH. Advised pt it was coded as V70-annual exam and she needs to call her insurance company to get a better understanding as to why they say these labs are NOT covered under her plan and then call billing off [phone]

## 2012-04-22 ENCOUNTER — Encounter: Payer: Self-pay | Admitting: Family Medicine

## 2012-07-09 ENCOUNTER — Encounter: Payer: Self-pay | Admitting: Family Medicine

## 2012-07-09 ENCOUNTER — Ambulatory Visit (INDEPENDENT_AMBULATORY_CARE_PROVIDER_SITE_OTHER): Payer: Medicare Other | Admitting: Family Medicine

## 2012-07-09 VITALS — BP 126/70 | HR 63 | Temp 98.1°F | Ht 61.5 in | Wt 146.8 lb

## 2012-07-09 DIAGNOSIS — R350 Frequency of micturition: Secondary | ICD-10-CM

## 2012-07-09 DIAGNOSIS — N390 Urinary tract infection, site not specified: Secondary | ICD-10-CM | POA: Insufficient documentation

## 2012-07-09 LAB — POCT URINALYSIS DIPSTICK
Glucose, UA: NEGATIVE
Ketones, UA: NEGATIVE
Protein, UA: NEGATIVE
Urobilinogen, UA: 0.2

## 2012-07-09 MED ORDER — SULFAMETHOXAZOLE-TRIMETHOPRIM 800-160 MG PO TABS: 1.0000 | ORAL_TABLET | Freq: Two times a day (BID) | ORAL | Status: DC

## 2012-07-09 NOTE — Patient Instructions (Addendum)
This is a UTI Start the Bactrim twice daily Drink plenty of fluids Call with any questions or concerns Hang in there!!!

## 2012-07-09 NOTE — Progress Notes (Signed)
  Subjective:    Patient ID: Denise Ewing, female    DOB: 1945-05-03, 67 y.o.   MRN: [id number]  HPI UTI- sxs started Wednesday w/ increased frequency and hesitancy.  + suprapubic pain and low back pain.  Started drinking Cranberry juice yesterday.  No fevers, nausea.  + dysuria.   Review of Systems For ROS see HPI     Objective:   Physical Exam  Vitals reviewed. Constitutional: She appears well-developed and well-nourished. No distress.  Abdominal: Soft. She exhibits no distension. There is no tenderness (no suprapubic or CVA tenderness).          Assessment & Plan:

## 2012-07-09 NOTE — Assessment & Plan Note (Signed)
New.  Pt's sxs and UA consistent w/ infxn.  Start abx.  Reviewed supportive care and red flags that should prompt return.  Pt expressed understanding and is in agreement w/ plan.  

## 2012-07-12 LAB — URINE CULTURE: Colony Count: >=100000

## 2012-11-30 ENCOUNTER — Ambulatory Visit (INDEPENDENT_AMBULATORY_CARE_PROVIDER_SITE_OTHER): Payer: Medicare Other

## 2012-11-30 DIAGNOSIS — Z23 Encounter for immunization: Secondary | ICD-10-CM

## 2012-12-27 LAB — HM MAMMOGRAPHY: HM Mammogram: NORMAL

## 2012-12-29 ENCOUNTER — Encounter: Payer: Self-pay | Admitting: Family Medicine

## 2013-02-07 ENCOUNTER — Telehealth: Payer: Self-pay

## 2013-02-07 NOTE — Telephone Encounter (Addendum)
Left message for call back Non identifiable Medication and allergies:  Reviewed and updated  90 day supply/mail order: na Local pharmacy: [facility] [address]   Immunizations due: Tdap and shingles  A/P:   No changes to FH or PSH or personal hx Flu vaccine--11/2012 PNA--08/2011 MMG--12/2012--no report CCS--02/2007 per patient PAP--11/2010--Eleanor Greene, MD  To Discuss with Provider: Mother passed in August

## 2013-02-08 ENCOUNTER — Encounter: Payer: Self-pay | Admitting: Family Medicine

## 2013-02-08 ENCOUNTER — Ambulatory Visit (INDEPENDENT_AMBULATORY_CARE_PROVIDER_SITE_OTHER): Payer: Medicare Other | Admitting: Family Medicine

## 2013-02-08 VITALS — BP 122/80 | HR 66 | Temp 98.1°F | Resp 16 | Ht 61.5 in | Wt 149.0 lb

## 2013-02-08 DIAGNOSIS — L259 Unspecified contact dermatitis, unspecified cause: Secondary | ICD-10-CM

## 2013-02-08 DIAGNOSIS — Z0000 Encounter for general adult medical examination without abnormal findings: Secondary | ICD-10-CM

## 2013-02-08 DIAGNOSIS — L309 Dermatitis, unspecified: Secondary | ICD-10-CM | POA: Insufficient documentation

## 2013-02-08 LAB — CBC WITH DIFFERENTIAL/PLATELET
Basophils Relative: 0.6 % (ref 0.0–3.0)
Eosinophils Absolute: 0.2 10*3/uL (ref 0.0–0.7)
Eosinophils Relative: 3.5 % (ref 0.0–5.0)
HCT: 38.4 % (ref 36.0–46.0)
Lymphs Abs: 1.5 10*3/uL (ref 0.7–4.0)
MCHC: 33 g/dL (ref 30.0–36.0)
MCV: 90.7 fl (ref 78.0–100.0)
Monocytes Absolute: 0.4 10*3/uL (ref 0.1–1.0)
Neutrophils Relative %: 60.2 % (ref 43.0–77.0)
RBC: 4.24 Mil/uL (ref 3.87–5.11)
WBC: 5.4 10*3/uL (ref 4.5–10.5)

## 2013-02-08 LAB — LIPID PANEL
Cholesterol: 178 mg/dL (ref 0–200)
HDL: 51.2 mg/dL (ref >39.00)
Triglycerides: 64 mg/dL (ref 0.0–149.0)
VLDL: 12.8 mg/dL (ref 0.0–40.0)

## 2013-02-08 LAB — BASIC METABOLIC PANEL
BUN: 17 mg/dL (ref 6–23)
Calcium: 9.2 mg/dL (ref 8.4–10.5)
Creatinine, Ser: 0.7 mg/dL (ref 0.4–1.2)
GFR: 88.4 mL/min (ref >60.00)
Glucose, Bld: 99 mg/dL (ref 70–99)

## 2013-02-08 LAB — HEPATIC FUNCTION PANEL
AST: 30 U/L (ref 0–37)
Albumin: 4.5 g/dL (ref 3.5–5.2)
Total Bilirubin: 0.6 mg/dL (ref 0.3–1.2)

## 2013-02-08 LAB — TSH: TSH: 1.64 u[IU]/mL (ref 0.35–5.50)

## 2013-02-08 NOTE — Progress Notes (Signed)
Pre visit review using our clinic review tool, if applicable. No additional management support is needed unless otherwise documented below in the visit note. 

## 2013-02-08 NOTE — Progress Notes (Signed)
   Subjective:    Patient ID: Denise Ewing, female    DOB: [birth year], 67 y.o.   MRN: [id number]  HPI Here today for CPE.  Risk Factors: Skin rash on breasts- started in March and appeared to be a bug bite.  Applied cortisone and it went away.  Now has one on tip of nipple on L breast, several lesions around it and now areas on R breast.  Has derm appt 1st week of Jan Physical Activity: exercising regularly Fall Risk: low risk Depression: no current sxs Hearing: normal to conversational tones and whispered voice ADL's: independent Cognitive: normal linear thought process, memory and attention Home Safety: safe at home, lives w/ husband Height, Weight, BMI, Visual Acuity: see vitals, vision corrected to 20/20 w/ glasses Counseling: UTD on pap, mammo, DEXA, and colonoscopy Labs Ordered: See A&P Care Plan: See A&P    Review of Systems For ROS see HPI     Objective:   Physical Exam  General Appearance:    Alert, cooperative, no distress, appears stated age  Head:    Normocephalic, without obvious abnormality, atraumatic  Eyes:    PERRL, conjunctiva/corneas clear, EOM's intact, fundi    benign, both eyes  Ears:    Normal TM's and external ear canals, both ears  Nose:   Nares normal, septum midline, mucosa normal, no drainage    or sinus tenderness  Throat:   Lips, mucosa, and tongue normal; teeth and gums normal  Neck:   Supple, symmetrical, trachea midline, no adenopathy;    Thyroid: no enlargement/tenderness/nodules  Back:     Symmetric, no curvature, ROM normal, no CVA tenderness  Lungs:     Clear to auscultation bilaterally, respirations unlabored  Chest Wall:    No tenderness or deformity   Heart:    Regular rate and rhythm, S1 and S2 normal, no murmur, rub   or gallop  Breast Exam:    No tenderness, masses, or nipple abnormality  Abdomen:     Soft, non-tender, bowel sounds active all four quadrants,    no masses, no organomegaly  Genitalia:    deferred  Rectal:      Extremities:   Extremities normal, atraumatic, no cyanosis or edema  Pulses:   2+ and symmetric all extremities  Skin:   Skin color, texture, turgor normal, faint maculopapular rash on breasts  Lymph nodes:   Cervical, supraclavicular, and axillary nodes normal  Neurologic:   CNII-XII intact, normal strength, sensation and reflexes    throughout          Assessment & Plan:

## 2013-02-08 NOTE — Patient Instructions (Signed)
Follow up in 1 year or as needed Keep up the good work!  You look great! We'll notify you of your lab results and make any changes if needed I suspect you have a type of eczema- continue the hydrocortisone cream Call with any questions or concerns Altamese Cabal Christmas!!!

## 2013-02-12 LAB — VITAMIN D 1,25 DIHYDROXY: Vitamin D 1, 25 (OH)2 Total: 77 pg/mL — ABNORMAL HIGH (ref 18–72)

## 2013-02-15 NOTE — Assessment & Plan Note (Signed)
Pt's PE WNL.  UTD on health maintenance.  Check labs.  Anticipatory guidance provided.  

## 2013-02-15 NOTE — Assessment & Plan Note (Addendum)
Pt's breast rash appears to be eczema.  Start steroid ointment.  Follow up w/ derm prn.

## 2013-03-22 ENCOUNTER — Telehealth: Payer: Self-pay | Admitting: Family Medicine

## 2013-03-22 NOTE — Telephone Encounter (Signed)
Patient Information:  Caller Name: Adrean  Phone: [phone]  Patient: Denise Ewing  Gender: Female  DOB: Jun 07, 1945  Age: 68 Years  PCP: [name].  Office Follow Up:  Does the office need to follow up with this patient?: No  Instructions For The Office: N/A  RN Note:  Pain has been intermittent x 3 months, but worse over past couple of weeks.  Describes pain as a "dull ache" to left side of face, and her dentist states it is not related to teeth.  States has ache from left eye area down to jaw.  Afebrile.  States there is no swelling noted, but states she "feels swollen."  Per facial pain protocol, disposition See Today in Office; no appts available in dispositioned time frame.  Appt offered in [facility]; prefers to see Dr. Birdie Riddle only and requests appt 03/23/13.  Appt scheduled 0945 03/23/13 with Dr. Birdie Riddle.  krs/can  Symptoms  Reason For Call & Symptoms: intermittent mouth pain and left side of face  Reviewed Health History In EMR: Yes  Reviewed Medications In EMR: Yes  Reviewed Allergies In EMR: Yes  Reviewed Surgeries / Procedures: Yes  Date of Onset of Symptoms: 12/17/2012  Guideline(s) Used:  Face Pain  Disposition Per Guideline:   See Today in Office  Reason For Disposition Reached:   Face pain present > 24 hours  Advice Given:  N/A  Patient Will Follow Care Advice:  YES  Appointment Scheduled:  03/23/2013 09:45:00 Appointment Scheduled Provider:  [name].

## 2013-03-23 ENCOUNTER — Encounter: Payer: Self-pay | Admitting: Family Medicine

## 2013-03-23 ENCOUNTER — Ambulatory Visit (INDEPENDENT_AMBULATORY_CARE_PROVIDER_SITE_OTHER): Payer: Medicare Other | Admitting: Family Medicine

## 2013-03-23 VITALS — BP 118/76 | HR 70 | Temp 98.2°F | Resp 16 | Wt 148.2 lb

## 2013-03-23 DIAGNOSIS — M19049 Primary osteoarthritis, unspecified hand: Secondary | ICD-10-CM | POA: Insufficient documentation

## 2013-03-23 DIAGNOSIS — R51 Headache: Secondary | ICD-10-CM

## 2013-03-23 DIAGNOSIS — R519 Headache, unspecified: Secondary | ICD-10-CM | POA: Insufficient documentation

## 2013-03-23 DIAGNOSIS — M542 Cervicalgia: Secondary | ICD-10-CM

## 2013-03-23 LAB — CBC WITH DIFFERENTIAL/PLATELET
BASOS ABS: 0 10*3/uL (ref 0.0–0.1)
Basophils Relative: 0.5 % (ref 0.0–3.0)
EOS PCT: 4.7 % (ref 0.0–5.0)
Eosinophils Absolute: 0.2 10*3/uL (ref 0.0–0.7)
HCT: 40.2 % (ref 36.0–46.0)
Hemoglobin: 13.1 g/dL (ref 12.0–15.0)
LYMPHS ABS: 1.7 10*3/uL (ref 0.7–4.0)
LYMPHS PCT: 33.1 % (ref 12.0–46.0)
MCHC: 32.6 g/dL (ref 30.0–36.0)
MCV: 93.5 fl (ref 78.0–100.0)
MONOS PCT: 8.1 % (ref 3.0–12.0)
Monocytes Absolute: 0.4 10*3/uL (ref 0.1–1.0)
NEUTROS PCT: 53.6 % (ref 43.0–77.0)
Neutro Abs: 2.7 10*3/uL (ref 1.4–7.7)
PLATELETS: 248 10*3/uL (ref 150.0–400.0)
RBC: 4.3 Mil/uL (ref 3.87–5.11)
RDW: 13.3 % (ref 11.5–14.6)
WBC: 5.1 10*3/uL (ref 4.5–10.5)

## 2013-03-23 LAB — SEDIMENTATION RATE: SED RATE: 10 mm/h (ref 0–22)

## 2013-03-23 LAB — RHEUMATOID FACTOR

## 2013-03-23 MED ORDER — TRAMADOL HCL 50 MG PO TABS: 50.0000 mg | ORAL_TABLET | Freq: Three times a day (TID) | ORAL | Status: DC | PRN

## 2013-03-23 MED ORDER — CLARITHROMYCIN 500 MG PO TABS: 500.0000 mg | ORAL_TABLET | Freq: Two times a day (BID) | ORAL | Status: DC

## 2013-03-23 NOTE — Assessment & Plan Note (Signed)
New.  Check labs to r/o RA.  Reviewed supportive care- tylenol, glucosamine, NSAIDs prn.  Will follow.

## 2013-03-23 NOTE — Progress Notes (Signed)
   Subjective:    Patient ID: Denise Ewing, female    DOB: [birth year], 68 y.o.   MRN: [id number]  HPI Jaw pain- L sided, front teeth are 'aching' and jaw pain is radiating outward and upward to under eye and into temple.  No pain w/ chewing.  Over the weekend developed pain w/ touching face.  Went to dentist- no cause for pain found.  Sister had similar sxs and had tooth abscess.  Denies congestion or sinus pain.  Hand arthritis- ongoing problem for pt.  Having increased swelling of R DIP joints.  Has never been assessed for RA   Review of Systems For ROS see HPI     Objective:   Physical Exam  Vitals reviewed. Constitutional: She appears well-developed and well-nourished. No distress.  HENT:  Mouth/Throat: Oropharynx is clear and moist.  No obvious dental abscess Multiple dental fillings No TTP over maxillary sinuses, TMJ, or temporal artery  Musculoskeletal:  Swelling and deformity of R DIP joints          Assessment & Plan:

## 2013-03-23 NOTE — Progress Notes (Signed)
Pre visit review using our clinic review tool, if applicable. No additional management support is needed unless otherwise documented below in the visit note. 

## 2013-03-23 NOTE — Patient Instructions (Signed)
Call me in 10 days if no improvement Start the Biaxin twice daily- take w/ food Use the Tramadol as needed for severe pain We'll notify you of your lab results and make any changes if needed Call with any questions or concerns Hang in there!!!

## 2013-03-23 NOTE — Assessment & Plan Note (Addendum)
New.  No obvious cause on PE.  Due to distribution of pain, sinusitis or dental abscess would be most likely dx.  Start abx.  Check CBC to assess for infxn.  Check ESR to r/o temporal arteritis although sxs make this unlikely.  Tramadol prn.  If no improvement w/ abx will get CT scan to assess

## 2013-05-03 ENCOUNTER — Telehealth: Payer: Self-pay | Admitting: *Deleted

## 2013-05-03 NOTE — Telephone Encounter (Signed)
Received medical records for patient from [facility]. Forms placed in CMA folder for review and abstraction. JG//CMA

## 2013-05-05 ENCOUNTER — Encounter: Payer: Self-pay | Admitting: General Practice

## 2013-11-10 ENCOUNTER — Ambulatory Visit: Payer: Medicare Other

## 2013-11-15 ENCOUNTER — Ambulatory Visit (INDEPENDENT_AMBULATORY_CARE_PROVIDER_SITE_OTHER): Payer: Medicare Other

## 2013-11-15 DIAGNOSIS — Z23 Encounter for immunization: Secondary | ICD-10-CM

## 2013-12-07 ENCOUNTER — Ambulatory Visit: Payer: Medicare Other

## 2014-01-03 LAB — HM MAMMOGRAPHY

## 2014-01-05 ENCOUNTER — Ambulatory Visit: Payer: Medicare Other | Admitting: Medical

## 2014-01-05 ENCOUNTER — Telehealth: Payer: Self-pay | Admitting: Family Medicine

## 2014-01-05 ENCOUNTER — Encounter: Payer: Self-pay | Admitting: General Practice

## 2014-01-05 ENCOUNTER — Encounter (HOSPITAL_BASED_OUTPATIENT_CLINIC_OR_DEPARTMENT_OTHER): Payer: Self-pay | Admitting: *Deleted

## 2014-01-05 ENCOUNTER — Emergency Department (HOSPITAL_BASED_OUTPATIENT_CLINIC_OR_DEPARTMENT_OTHER): Payer: Medicare Other

## 2014-01-05 ENCOUNTER — Observation Stay (HOSPITAL_BASED_OUTPATIENT_CLINIC_OR_DEPARTMENT_OTHER)
Admission: EM | Admit: 2014-01-05 | Discharge: 2014-01-06 | Disposition: A | Discharge status: Home / Self Care | Payer: Medicare Other | Attending: Internal Medicine | Admitting: Internal Medicine

## 2014-01-05 DIAGNOSIS — Z79899 Other long term (current) drug therapy: Secondary | ICD-10-CM | POA: Insufficient documentation

## 2014-01-05 DIAGNOSIS — Z880 Allergy status to penicillin: Secondary | ICD-10-CM | POA: Diagnosis not present

## 2014-01-05 DIAGNOSIS — Z85820 Personal history of malignant melanoma of skin: Secondary | ICD-10-CM | POA: Diagnosis not present

## 2014-01-05 DIAGNOSIS — R079 Chest pain, unspecified: Secondary | ICD-10-CM | POA: Diagnosis present

## 2014-01-05 DIAGNOSIS — K219 Gastro-esophageal reflux disease without esophagitis: Secondary | ICD-10-CM | POA: Diagnosis not present

## 2014-01-05 DIAGNOSIS — H409 Unspecified glaucoma: Secondary | ICD-10-CM | POA: Insufficient documentation

## 2014-01-05 DIAGNOSIS — I10 Essential (primary) hypertension: Secondary | ICD-10-CM | POA: Diagnosis not present

## 2014-01-05 DIAGNOSIS — Z7982 Long term (current) use of aspirin: Secondary | ICD-10-CM | POA: Diagnosis not present

## 2014-01-05 DIAGNOSIS — Z86010 Personal history of colonic polyps: Secondary | ICD-10-CM | POA: Insufficient documentation

## 2014-01-05 LAB — COMPREHENSIVE METABOLIC PANEL
ALBUMIN: 4.1 g/dL (ref 3.5–5.2)
ALT: 19 U/L (ref 0–35)
ANION GAP: 10 (ref 5–15)
AST: 23 U/L (ref 0–37)
Alkaline Phosphatase: 69 U/L (ref 39–117)
BILIRUBIN TOTAL: 0.3 mg/dL (ref 0.3–1.2)
BUN: 18 mg/dL (ref 6–23)
CHLORIDE: 102 meq/L (ref 96–112)
CO2: 27 mEq/L (ref 19–32)
CREATININE: 0.9 mg/dL (ref 0.50–1.10)
Calcium: 9.7 mg/dL (ref 8.4–10.5)
GFR calc Af Amer: 74 mL/min — ABNORMAL LOW (ref >90)
GFR calc non Af Amer: 64 mL/min — ABNORMAL LOW (ref >90)
Glucose, Bld: 101 mg/dL — ABNORMAL HIGH (ref 70–99)
Potassium: 4.5 mEq/L (ref 3.7–5.3)
Sodium: 139 mEq/L (ref 137–147)
TOTAL PROTEIN: 7.4 g/dL (ref 6.0–8.3)

## 2014-01-05 LAB — CBC
HCT: 38.7 % (ref 36.0–46.0)
Hemoglobin: 12.6 g/dL (ref 12.0–15.0)
MCH: 31 pg (ref 26.0–34.0)
MCHC: 32.6 g/dL (ref 30.0–36.0)
MCV: 95.1 fL (ref 78.0–100.0)
PLATELETS: 245 10*3/uL (ref 150–400)
RBC: 4.07 MIL/uL (ref 3.87–5.11)
RDW: 12.7 % (ref 11.5–15.5)
WBC: 6.2 10*3/uL (ref 4.0–10.5)

## 2014-01-05 LAB — TROPONIN I: Troponin I: <0.3 ng/mL (ref <0.30)

## 2014-01-05 MED ORDER — ONDANSETRON HCL 4 MG PO TABS: 4.0000 mg | ORAL_TABLET | Freq: Four times a day (QID) | ORAL | Status: DC | PRN

## 2014-01-05 MED ORDER — ONDANSETRON HCL 4 MG/2ML IJ SOLN: 4.0000 mg | Freq: Four times a day (QID) | INTRAMUSCULAR | Status: DC | PRN

## 2014-01-05 MED ORDER — ACETAMINOPHEN 325 MG PO TABS: 650.0000 mg | ORAL_TABLET | Freq: Four times a day (QID) | ORAL | Status: DC | PRN

## 2014-01-05 MED ORDER — ENOXAPARIN SODIUM 40 MG/0.4ML ~~LOC~~ SOLN
40.0000 mg | Freq: Every day | SUBCUTANEOUS | Status: DC
  Administered 2014-01-06: 40 mg via SUBCUTANEOUS
  Filled 2014-01-05: qty 0.4

## 2014-01-05 MED ORDER — ACETAMINOPHEN 650 MG RE SUPP: 650.0000 mg | Freq: Four times a day (QID) | RECTAL | Status: DC | PRN

## 2014-01-05 MED ORDER — TIMOLOL MALEATE 0.25 % OP SOLN
1.0000 [drp] | Freq: Two times a day (BID) | OPHTHALMIC | Status: DC
  Administered 2014-01-06: 1 [drp] via OPHTHALMIC
  Filled 2014-01-05 (×2): qty 5

## 2014-01-05 MED ORDER — SODIUM CHLORIDE 0.9 % IJ SOLN: 3.0000 mL | Freq: Two times a day (BID) | INTRAMUSCULAR | Status: DC

## 2014-01-05 MED ORDER — LATANOPROST 0.005 % OP SOLN
1.0000 [drp] | Freq: Every day | OPHTHALMIC | Status: DC
  Filled 2014-01-05: qty 2.5

## 2014-01-05 MED ORDER — SODIUM CHLORIDE 0.9 % IJ SOLN
3.0000 mL | Freq: Two times a day (BID) | INTRAMUSCULAR | Status: DC
  Administered 2014-01-05: 3 mL via INTRAVENOUS

## 2014-01-05 MED ORDER — ASPIRIN EC 325 MG PO TBEC
325.0000 mg | DELAYED_RELEASE_TABLET | Freq: Every day | ORAL | Status: DC
  Administered 2014-01-06: 325 mg via ORAL
  Filled 2014-01-05: qty 1

## 2014-01-05 MED ORDER — ASPIRIN 81 MG PO CHEW
324.0000 mg | CHEWABLE_TABLET | Freq: Once | ORAL | Status: AC
  Administered 2014-01-05: 324 mg via ORAL
  Filled 2014-01-05: qty 4

## 2014-01-05 MED ORDER — NITROGLYCERIN 0.4 MG SL SUBL
0.4000 mg | SUBLINGUAL_TABLET | SUBLINGUAL | Status: DC | PRN
  Administered 2014-01-06: 0.4 mg via SUBLINGUAL

## 2014-01-05 NOTE — H&P (Signed)
[facility] History and Physical  Katrine Radich BWG:[id number] DOB: 1945/10/15 DOA: 01/05/2014  Referring physician: patient was transferred from [place] [facility]. PCP: Annye Asa, MD   Chief Complaint: Chest pain.  HPI: Denise Ewing is a 68 y.o. female with history of glaucoma has been experiencing chest pain off and on for last 2 weeks. Last evening patient says pain became more constant and had contacted her PCP who advised her to go to the ER. Patient's sister recently passed away from MI 2 weeks ago. Patient also had recently traveled to [place] last week. Patient denies any shortness of breath productive cough fever or chills. Pain is mostly retrosternal pressure-like nonradiating is no relation to exertion. Presently patient is chest pain-free. EKG and cardiac markers were unremarkable. Given patient's family history of MI and nature of chest pain patient has been admitted for further observation.   Review of Systems: As presented in the history of presenting illness, rest negative.  Past Medical History  Diagnosis Date  . Melanoma of thigh   . GERD (gastroesophageal reflux disease)   . Glaucoma   . History of colonic polyps    Past Surgical History  Procedure Laterality Date  . Melanoma removed      R thigh  . Colon polyps removed    . Total abdominal hysterectomy w/ bilateral salpingoophorectomy      uterine fibroids   Social History:  reports that she has never smoked. She has never used smokeless tobacco. She reports that she drinks alcohol. She reports that she does not use illicit drugs. Where does patient live home. Can patient participate in ADLs? Yes.  Allergies  Allergen Reactions  . Amoxicillin     hives    Family History:  Family History  Problem Relation Age of Onset  . Hypertension Mother   . Cancer Mother     breast,anal  . Osteoporosis Mother   . CAD Sister       Prior to Admission medications   Medication Sig Start Date End  Date Taking? Authorizing Provider  alendronate (FOSAMAX) 10 MG tablet Take 1 tablet by mouth daily. 03/07/13   Historical Provider, MD  aspirin 81 MG tablet Take 81 mg by mouth daily.    Historical Provider, MD  bimatoprost (LUMIGAN) 0.03 % ophthalmic solution 1 drop at bedtime.      Historical Provider, MD  Calcium Citrate-Vitamin D (CITRACAL/VITAMIN D) 250-200 MG-UNIT TABS Take by mouth daily.      Historical Provider, MD  clarithromycin (BIAXIN) 500 MG tablet Take 1 tablet (500 mg total) by mouth 2 (two) times daily. 03/23/13   [name], MD  conjugated estrogens (PREMARIN) vaginal cream Place 1 g vaginally 2 (two) times a week.     Historical Provider, MD  GLUCOSAMINE CHONDROITIN COMPLX PO Take by mouth daily.    Historical Provider, MD  KRILL OIL 1000 MG CAPS Take 1 capsule by mouth daily.    Historical Provider, MD  Multiple Vitamins-Minerals (ALIVE WOMENS 50+ PO) Take by mouth daily.    Historical Provider, MD  Omeprazole Magnesium (PRILOSEC OTC PO) Take by mouth daily.    Historical Provider, MD  timolol (TIMOPTIC) 0.25 % ophthalmic solution 1 drop 2 (two) times daily.      Historical Provider, MD  traMADol (ULTRAM) 50 MG tablet Take 1 tablet (50 mg total) by mouth every 8 (eight) hours as needed. 03/23/13   [name], MD    Physical Exam: Filed Vitals:   01/05/14  1839 01/05/14 1958 01/05/14 2052 01/05/14 2054  BP: 145/65 144/73 163/69 158/66  Pulse: 70 72 69 66  Temp:   98 F (36.7 C)   TempSrc:   Oral   Resp: 16 15 19    Height:   5' 1.5" (1.562 m)   Weight:   65.7 kg (144 lb 13.5 oz)   SpO2: 100% 100% 98%      General:  Well-developed and nourished.  Eyes: anicteric no pallor.  ENT: no discharge from the ears eyes nose and mouth.  Neck: no mass felt.  Cardiovascular: S1-S2 heard.  Respiratory: no rhonchi or crepitations.  Abdomen: soft nontender bowel sounds present.  Skin: no rash.  Musculoskeletal:no edema.  Psychiatric:appears  normal.  Neurologic: alert awake oriented to time place and person. Moves all extremities.  Labs on Admission:  Basic Metabolic Panel:  Recent Labs Lab 01/05/14 1703  NA 139  K 4.5  CL 102  CO2 27  GLUCOSE 101*  BUN 18  CREATININE 0.90  CALCIUM 9.7   Liver Function Tests:  Recent Labs Lab 01/05/14 1703  AST 23  ALT 19  ALKPHOS 69  BILITOT 0.3  PROT 7.4  ALBUMIN 4.1   No results for input(s): LIPASE, AMYLASE in the last 168 hours. No results for input(s): AMMONIA in the last 168 hours. CBC:  Recent Labs Lab 01/05/14 1703  WBC 6.2  HGB 12.6  HCT 38.7  MCV 95.1  PLT 245   Cardiac Enzymes:  Recent Labs Lab 01/05/14 1703  TROPONINI <0.30    BNP (last 3 results) No results for input(s): PROBNP in the last 8760 hours. CBG: No results for input(s): GLUCAP in the last 168 hours.  Radiological Exams on Admission: Dg Chest 2 View  01/05/2014   CLINICAL DATA:  Mid chest pain.  Left-sided facial numbness.  EXAM: CHEST  2 VIEW  COMPARISON:  None.  FINDINGS: Heart size is normal. No focal consolidations or pleural effusions. Visualized osseous structures have a normal appearance.  IMPRESSION: No active cardiopulmonary disease.   Electronically Signed   By: Shon Hale M.D.   On: 01/05/2014 17:25    EKG: Independently reviewed. Normal sinus rhythm.  Assessment/Plan Principal Problem:   Chest pain Active Problems:   Pain in the chest   1. Chest pain - breast which is pain-free. Cycle cardiac markers. Check d-dimer. Aspirin. When necessary nitroglycerin. Check 2-D echo. Patient will be kept nothing by mouth in anticipation of possible cardiac procedure. 2. Elevated blood pressure - closely observe for blood pressure trends.    Code Status: full code.  Family Communication: husband at the bedside.  Disposition Plan: admit for observation.    Ivanna Kocak N. [facility] Pager [phone].  If 7PM-7AM, please contact  night-coverage [email or web address] Password [facility] 01/05/2014, 11:23 PM

## 2014-01-05 NOTE — Telephone Encounter (Signed)
Patient Information:  Caller Name: Liba  Phone: [phone]  Patient: Denise Ewing  Gender: Female  DOB: [birth year]  Age: 68 Years  PCP: [name].  Office Follow Up:  Does the office need to follow up with this patient?: Yes  Instructions For The Office: Indigestion, fullness in chest  RN Note:  Patient calling regarding increase in indigestion which is relieved with omeprazole BID.  Also states she has concerns regarding localized facial numbness.  She went to dentist regarding this and was told she had something stuck in gum, after cleaning she did get relief, but small area on left jaw continues to be mildly numb.  In regards to indigestion she states she also has a pressure which is between her neck in breast.  Denies pain, SOB, or fatigue.  These symptoms have been present for more than a month.  Patient beca,e concerned  after recently loosing her sister to Sudden MI.  Appointment offered for visit today at 16:00, but patient states her husband is out with the car and she is  not sure when he will return.  Desires an appointment for AM.  Call back parameters given.  Patient is requesting a call back from office for scheduling of visit.  Symptoms  Reason For Call & Symptoms: left sided  occasional numbness from jaw to cheek, indigestion, pressure from neck to breast.  Reviewed Health History In EMR: Yes  Reviewed Medications In EMR: Yes  Reviewed Allergies In EMR: Yes  Reviewed Surgeries / Procedures: Yes  Date of Onset of Symptoms: 12/08/2013  Treatments Tried: omeprazole, Tums  Treatments Tried Worked: Yes  Guideline(s) Used:  Neurologic Deficit  Chest Pain  Disposition Per Guideline:   Go to ED Now (or to Office with PCP Approval)  Reason For Disposition Reached:   Intermittent chest pain and pain has been increasing in severity or frequency  Advice Given:  Call Back If:  Severe chest pain  Constant chest pain lasting longer than 5 minutes  Difficulty  breathing  You become worse.  Patient Refused Recommendation:  Patient Will Follow Up With Office Later  Requesting call from office to schedule appointment

## 2014-01-05 NOTE — ED Provider Notes (Addendum)
CSN: [id number]     Arrival date & time 01/05/14  1612 History   First MD Initiated Contact with Patient 01/05/14 1736     Chief Complaint  Patient presents with  . Chest Pain    HPI Comments: Has history of GERD.  Over the last couple of weeks the pain has been more constant.  She has history of GERD but has been trying tums and omeprazole without relief.  Recently her sister passed away from an MI.  Patient is a 68 y.o. female presenting with chest pain. The history is provided by the patient.  Chest Pain Pain location:  Substernal area Pain quality: pressure   Pain radiates to:  L jaw Pain severity:  Moderate Onset quality:  Gradual Duration:  2 weeks Timing:  Intermittent (the episodes last 15-30 minutes) Progression:  Worsening Chronicity:  Recurrent Relieved by:  Nothing Exacerbated by: not by eating, although some foods exacerbate  it. Associated symptoms: nausea   Associated symptoms: no fever, no lower extremity edema and no shortness of breath   Risk factors: no coronary artery disease, no diabetes mellitus, no high cholesterol, no hypertension and no surgery     Past Medical History  Diagnosis Date  . Melanoma of thigh   . GERD (gastroesophageal reflux disease)   . Glaucoma   . History of colonic polyps    Past Surgical History  Procedure Laterality Date  . Melanoma removed      R thigh  . Colon polyps removed    . Total abdominal hysterectomy w/ bilateral salpingoophorectomy      uterine fibroids   Family History  Problem Relation Age of Onset  . Hypertension Mother   . Cancer Mother     breast,anal  . Osteoporosis Mother    History  Substance Use Topics  . Smoking status: Never Smoker   . Smokeless tobacco: Never Used  . Alcohol Use: Yes     Comment:  occasionally   OB History    No data available     Review of Systems  Constitutional: Negative for fever.  Respiratory: Negative for shortness of breath.   Cardiovascular: Positive for chest  pain.  Gastrointestinal: Positive for nausea.  All other systems reviewed and are negative.     Allergies  Amoxicillin  Home Medications   Prior to Admission medications   Medication Sig Start Date End Date Taking? Authorizing Provider  alendronate (FOSAMAX) 10 MG tablet Take 1 tablet by mouth daily. 03/07/13   Historical Provider, MD  aspirin 81 MG tablet Take 81 mg by mouth daily.    Historical Provider, MD  bimatoprost (LUMIGAN) 0.03 % ophthalmic solution 1 drop at bedtime.      Historical Provider, MD  Calcium Citrate-Vitamin D (CITRACAL/VITAMIN D) 250-200 MG-UNIT TABS Take by mouth daily.      Historical Provider, MD  clarithromycin (BIAXIN) 500 MG tablet Take 1 tablet (500 mg total) by mouth 2 (two) times daily. 03/23/13   [name], MD  conjugated estrogens (PREMARIN) vaginal cream Place 1 g vaginally 2 (two) times a week.     Historical Provider, MD  GLUCOSAMINE CHONDROITIN COMPLX PO Take by mouth daily.    Historical Provider, MD  KRILL OIL 1000 MG CAPS Take 1 capsule by mouth daily.    Historical Provider, MD  Multiple Vitamins-Minerals (ALIVE WOMENS 50+ PO) Take by mouth daily.    Historical Provider, MD  Omeprazole Magnesium (PRILOSEC OTC PO) Take by mouth daily.    Historical Provider,  MD  timolol (TIMOPTIC) 0.25 % ophthalmic solution 1 drop 2 (two) times daily.      Historical Provider, MD  traMADol (ULTRAM) 50 MG tablet Take 1 tablet (50 mg total) by mouth every 8 (eight) hours as needed. 03/23/13   [name], MD   BP 153/61 mmHg  Pulse 71  Temp(Src) 98 F (36.7 C) (Oral)  Resp 16  Wt 144 lb (65.318 kg)  SpO2 97% Physical Exam  Constitutional: She appears well-developed and well-nourished. No distress.  HENT:  Head: Normocephalic and atraumatic.  Right Ear: External ear normal.  Left Ear: External ear normal.  Eyes: Conjunctivae are normal. Right eye exhibits no discharge. Left eye exhibits no discharge. No scleral icterus.  Neck: Neck supple. No  tracheal deviation present.  Cardiovascular: Normal rate, regular rhythm and intact distal pulses.   Pulmonary/Chest: Effort normal and breath sounds normal. No stridor. No respiratory distress. She has no wheezes. She has no rales.  Abdominal: Soft. Bowel sounds are normal. She exhibits no distension. There is no tenderness. There is no rebound and no guarding.  Musculoskeletal: She exhibits no edema or tenderness.  Neurological: She is alert. She has normal strength. No cranial nerve deficit (no facial droop, extraocular movements intact, no slurred speech) or sensory deficit. She exhibits normal muscle tone. She displays no seizure activity. Coordination normal.  Skin: Skin is warm and dry. No rash noted.  Psychiatric: She has a normal mood and affect.  Nursing note and vitals reviewed.   ED Course  Procedures (including critical care time) Labs Review Labs Reviewed  COMPREHENSIVE METABOLIC PANEL - Abnormal; Notable for the following:    Glucose, Bld 101 (*)    GFR calc non Af Amer 64 (*)    GFR calc Af Amer 74 (*)    All other components within normal limits  CBC  TROPONIN I    Imaging Review Dg Chest 2 View  01/05/2014   CLINICAL DATA:  Mid chest pain.  Left-sided facial numbness.  EXAM: CHEST  2 VIEW  COMPARISON:  None.  FINDINGS: Heart size is normal. No focal consolidations or pleural effusions. Visualized osseous structures have a normal appearance.  IMPRESSION: No active cardiopulmonary disease.   Electronically Signed   By: Shon Hale M.D.   On: 01/05/2014 17:25     EKG Interpretation   Date/Time:  Thursday January 05 2014 16:28:00 EST Ventricular Rate:  63 PR Interval:  138 QRS Duration: 80 QT Interval:  420 QTC Calculation: 429 R Axis:   73 Text Interpretation:  Normal sinus rhythm Normal ECG No significant change  since last tracing Confirmed by Andreana Klingerman  MD-J, Terrah Decoster ([id number]) on 01/05/2014  5:52:57 PM      MDM   Final diagnoses:  Chest pain, unspecified  chest pain type    Patient has been having intermittent episodes of chest pain off-and-on for the last couple of weeks. Has had some increased stress recently as her sister died of a myocardial infarction. Patient's symptoms have been discomfort in her chest with some numbness in her jaw. Patient doesn't relate that her symptoms are specifically related to food.  Symptoms are somewhat atypical but she is 68 years old and has a strong recent family history. Her brother also had a myocardial infarction. It is reasonable for her to be admitted for observation with arrangement of a stress test.  I will consult with the medical service at [facility].    Dorie Rank, MD 01/05/14 [id number]  Dorie Rank, MD  01/13/14 0927 

## 2014-01-05 NOTE — ED Notes (Addendum)
Pt c/o chest pain and left side facial numbness x 2 week " off and on" sent here from [facility] office for eval , sister died last week of MI

## 2014-01-05 NOTE — Significant Event (Signed)
[facility] report to [facility] for pending admission  68 y/o ? c/o intermittent CP 1-2 wk.  Sister passed 2 wk ago MI.  Doesn't seem like reflux.  Central CP with numbness and radiation.  Intermittent with onset for 15 min at a time.  Dr. Tomi Bamberger thinks she is low risk but given symptomatolgy feels reasonable. In Ed CP free. Received ASA Hasn't needed Nitro   Agree to admit for OBs on telemetry-might need close follow up as OP and Exercise stress Consider ECHO ? Takotsubo as might be fitting with his.  Call Flow manager [phone] on patient arrival to floor for team asignement  Verneita Griffes, MD [facility] [phone]

## 2014-01-05 NOTE — Telephone Encounter (Signed)
Situation was discussed with Dr. Birdie Riddle.  Dr. Birdie Riddle advised ER.  Given patient's symptoms (indigestion, pressure from neck to breast, and occasional numbness from jaw to cheek) and her concern regarding losing her sister recently to Sudden MI, pt was highly advised to go to [facility] ER for evaluation.  Pt stated understanding and agreed.

## 2014-01-05 NOTE — ED Notes (Signed)
Patient transported to X-ray 

## 2014-01-06 ENCOUNTER — Observation Stay (HOSPITAL_COMMUNITY): Payer: Medicare Other

## 2014-01-06 DIAGNOSIS — I369 Nonrheumatic tricuspid valve disorder, unspecified: Secondary | ICD-10-CM

## 2014-01-06 DIAGNOSIS — R079 Chest pain, unspecified: Secondary | ICD-10-CM

## 2014-01-06 DIAGNOSIS — K219 Gastro-esophageal reflux disease without esophagitis: Secondary | ICD-10-CM

## 2014-01-06 LAB — CBC
HCT: 38.5 % (ref 36.0–46.0)
HEMOGLOBIN: 12.9 g/dL (ref 12.0–15.0)
MCH: 30.8 pg (ref 26.0–34.0)
MCHC: 33.5 g/dL (ref 30.0–36.0)
MCV: 91.9 fL (ref 78.0–100.0)
Platelets: 254 10*3/uL (ref 150–400)
RBC: 4.19 MIL/uL (ref 3.87–5.11)
RDW: 12.8 % (ref 11.5–15.5)
WBC: 6.9 10*3/uL (ref 4.0–10.5)

## 2014-01-06 LAB — CREATININE, SERUM
Creatinine, Ser: 0.78 mg/dL (ref 0.50–1.10)
GFR, EST NON AFRICAN AMERICAN: 83 mL/min — AB (ref >90)

## 2014-01-06 LAB — CBC WITH DIFFERENTIAL/PLATELET
BASOS ABS: 0 10*3/uL (ref 0.0–0.1)
Basophils Relative: 1 % (ref 0–1)
Eosinophils Absolute: 0.2 10*3/uL (ref 0.0–0.7)
Eosinophils Relative: 4 % (ref 0–5)
HCT: 38.7 % (ref 36.0–46.0)
Hemoglobin: 12.8 g/dL (ref 12.0–15.0)
LYMPHS ABS: 1.9 10*3/uL (ref 0.7–4.0)
LYMPHS PCT: 32 % (ref 12–46)
MCH: 30.6 pg (ref 26.0–34.0)
MCHC: 33.1 g/dL (ref 30.0–36.0)
MCV: 92.6 fL (ref 78.0–100.0)
Monocytes Absolute: 0.6 10*3/uL (ref 0.1–1.0)
Monocytes Relative: 10 % (ref 3–12)
NEUTROS ABS: 3.2 10*3/uL (ref 1.7–7.7)
NEUTROS PCT: 55 % (ref 43–77)
Platelets: 249 10*3/uL (ref 150–400)
RBC: 4.18 MIL/uL (ref 3.87–5.11)
RDW: 12.9 % (ref 11.5–15.5)
WBC: 5.9 10*3/uL (ref 4.0–10.5)

## 2014-01-06 LAB — COMPREHENSIVE METABOLIC PANEL
ALT: 16 U/L (ref 0–35)
ANION GAP: 14 (ref 5–15)
AST: 20 U/L (ref 0–37)
Albumin: 3.9 g/dL (ref 3.5–5.2)
Alkaline Phosphatase: 59 U/L (ref 39–117)
BUN: 17 mg/dL (ref 6–23)
CALCIUM: 9.5 mg/dL (ref 8.4–10.5)
CO2: 24 mEq/L (ref 19–32)
Chloride: 104 mEq/L (ref 96–112)
Creatinine, Ser: 0.71 mg/dL (ref 0.50–1.10)
GFR calc Af Amer: >90 mL/min (ref >90)
GFR calc non Af Amer: 86 mL/min — ABNORMAL LOW (ref >90)
Glucose, Bld: 98 mg/dL (ref 70–99)
Potassium: 3.9 mEq/L (ref 3.7–5.3)
Sodium: 142 mEq/L (ref 137–147)
Total Bilirubin: 0.5 mg/dL (ref 0.3–1.2)
Total Protein: 6.6 g/dL (ref 6.0–8.3)

## 2014-01-06 LAB — TROPONIN I: Troponin I: <0.3 ng/mL (ref <0.30)

## 2014-01-06 LAB — D-DIMER, QUANTITATIVE (NOT AT ARMC)

## 2014-01-06 MED ORDER — HYDROCHLOROTHIAZIDE 12.5 MG PO TABS: 25.0000 mg | ORAL_TABLET | Freq: Every day | ORAL | Status: DC

## 2014-01-06 MED ORDER — METOPROLOL TARTRATE 50 MG PO TABS: 50.0000 mg | ORAL_TABLET | Freq: Every morning | ORAL | Status: DC

## 2014-01-06 MED ORDER — NITROGLYCERIN 0.4 MG SL SUBL
SUBLINGUAL_TABLET | SUBLINGUAL | Status: AC
  Filled 2014-01-06: qty 1

## 2014-01-06 MED ORDER — METOPROLOL TARTRATE 1 MG/ML IV SOLN
INTRAVENOUS | Status: AC
  Filled 2014-01-06: qty 5

## 2014-01-06 MED ORDER — METOPROLOL TARTRATE 1 MG/ML IV SOLN: 5.0000 mg | Freq: Once | INTRAVENOUS | Status: DC

## 2014-01-06 MED ORDER — IOHEXOL 350 MG/ML SOLN
80.0000 mL | Freq: Once | INTRAVENOUS | Status: AC | PRN
  Administered 2014-01-06: 80 mL via INTRAVENOUS

## 2014-01-06 MED ORDER — ATORVASTATIN CALCIUM 40 MG PO TABS: 40.0000 mg | ORAL_TABLET | Freq: Every day | ORAL | Status: DC

## 2014-01-06 MED ORDER — METOPROLOL TARTRATE 50 MG PO TABS
50.0000 mg | ORAL_TABLET | Freq: Once | ORAL | Status: AC
  Administered 2014-01-06: 50 mg via ORAL
  Filled 2014-01-06: qty 1

## 2014-01-06 NOTE — Progress Notes (Signed)
Patient arrived to CT with an 18g in rt a/c. That was reportedly started by RN on the floor, but no documentation found in Epic.  The IV was visibly bruised and infiltrated on arrival.  A new #18g was placed by this technologist at 1310 in the Left [facility], and charted by same.  Existing 18g in right AC was discontinued by [facility], CT tech.

## 2014-01-06 NOTE — Progress Notes (Signed)
  Echocardiogram 2D Echocardiogram has been performed.  Denise Ewing 01/06/2014, 4:47 PM

## 2014-01-06 NOTE — Progress Notes (Signed)
UR completed 

## 2014-01-06 NOTE — Progress Notes (Signed)
Results of scan noted. Patient with moderate CAD of LAD  Nothing appears critical. Her sympotms were somewhat atypical. I would treat for reflux with protonix Patient needs to be on a statin.  Would check random lipids then place on 40 lipitor I will make sure she has appt f/u with me.    Tried to contact Dr Allyson Sabal.

## 2014-01-06 NOTE — Plan of Care (Signed)
Problem: Phase I Progression Outcomes Goal: Pain controlled with appropriate interventions Outcome: Completed/Met Date Met:  01/06/14 Goal: OOB as tolerated unless otherwise ordered Outcome: Completed/Met Date Met:  01/06/14 Goal: Initial discharge plan identified Outcome: Completed/Met Date Met:  01/06/14 Goal: Voiding-avoid urinary catheter unless indicated Outcome: Completed/Met Date Met:  01/06/14 Goal: Hemodynamically stable Outcome: Completed/Met Date Met:  01/06/14 Goal: Other Phase I Outcomes/Goals Outcome: Not Applicable Date Met:  69/62/95

## 2014-01-06 NOTE — Discharge Summary (Signed)
Physician Discharge Summary  Denise Ewing MRN: [id number] DOB/AGE: [birth year] 68 y.o.  PCP: Annye Asa, MD   Admit date: 01/05/2014 Discharge date: 01/06/2014  Discharge Diagnoses:   Chest pain likely noncardiac Gastroesophageal reflux disease Glaucoma  follow-up recommendations follow-up with PCP in 5-7 days PCP to follow-up on the results of the 2-D echo     Medication List    STOP taking these medications        clarithromycin 500 MG tablet  Commonly known as:  BIAXIN      TAKE these medications        alendronate 10 MG tablet  Commonly known as:  FOSAMAX  Take 1 tablet by mouth daily.     ALIVE WOMENS 50+ PO  Take by mouth daily.     aspirin 81 MG tablet  Take 81 mg by mouth daily.     bimatoprost 0.03 % ophthalmic solution  Commonly known as:  LUMIGAN  1 drop at bedtime.     CITRACAL/VITAMIN D 250-200 MG-UNIT Tabs  Generic drug:  Calcium Citrate-Vitamin D  Take by mouth daily.     conjugated estrogens vaginal cream  Commonly known as:  PREMARIN  Place 1 g vaginally 2 (two) times a week.     GLUCOSAMINE CHONDROITIN COMPLX PO  Take by mouth daily.     Krill Oil 1000 MG Caps  Take 1 capsule by mouth daily.     PRILOSEC OTC PO  Take by mouth daily.     timolol 0.25 % ophthalmic solution  Commonly known as:  TIMOPTIC  1 drop 2 (two) times daily.     traMADol 50 MG tablet  Commonly known as:  ULTRAM  Take 1 tablet (50 mg total) by mouth every 8 (eight) hours as needed.        Discharge Condition: stable   Disposition: Final discharge disposition not confirmed   Consults:  Cardiology  Significant Diagnostic Studies: Dg Chest 2 View  01/05/2014   CLINICAL DATA:  Mid chest pain.  Left-sided facial numbness.  EXAM: CHEST  2 VIEW  COMPARISON:  None.  FINDINGS: Heart size is normal. No focal consolidations or pleural effusions. Visualized osseous structures have a normal appearance.  IMPRESSION: No active cardiopulmonary  disease.   Electronically Signed   By: Shon Hale M.D.   On: 01/05/2014 17:25   Ct Coronary Morp W/cta Cor W/score W/ca W/cm &/or Wo/cm  01/06/2014   EXAM: OVER-READ INTERPRETATION  CT CHEST  The following report is an over-read performed by radiologist Dr. Eben Burow [facility] on 01/06/2014. This over-read does not include interpretation of cardiac or coronary anatomy or pathology. The coronary CTA interpretation by the cardiologist is attached.  COMPARISON:  None.  FINDINGS: The visualized portions of the lungs are clear. The visualized portions of the mediastinum and chest wall show no significant abnormality.  IMPRESSION: No significant non-cardiac abnormality seen in visualized portion of the thorax.   Electronically Signed   By: Earle Gell M.D.   On: 01/06/2014 13:33       Microbiology: No results found for this or any previous visit (from the past 240 hour(s)).   Labs: Results for orders placed or performed during the hospital encounter of 01/05/14 (from the past 48 hour(s))  CBC     Status: None   Collection Time: 01/05/14  5:03 PM  Result Value Ref Range   WBC 6.2 4.0 - 10.5 K/uL   RBC 4.07 3.87 - 5.11 MIL/uL  Hemoglobin 12.6 12.0 - 15.0 g/dL   HCT 38.7 36.0 - 46.0 %   MCV 95.1 78.0 - 100.0 fL   MCH 31.0 26.0 - 34.0 pg   MCHC 32.6 30.0 - 36.0 g/dL   RDW 12.7 11.5 - 15.5 %   Platelets 245 150 - 400 K/uL  Comprehensive metabolic panel     Status: Abnormal   Collection Time: 01/05/14  5:03 PM  Result Value Ref Range   Sodium 139 137 - 147 mEq/L   Potassium 4.5 3.7 - 5.3 mEq/L   Chloride 102 96 - 112 mEq/L   CO2 27 19 - 32 mEq/L   Glucose, Bld 101 (H) 70 - 99 mg/dL   BUN 18 6 - 23 mg/dL   Creatinine, Ser 0.90 0.50 - 1.10 mg/dL   Calcium 9.7 8.4 - 10.5 mg/dL   Total Protein 7.4 6.0 - 8.3 g/dL   Albumin 4.1 3.5 - 5.2 g/dL   AST 23 0 - 37 U/L   ALT 19 0 - 35 U/L   Alkaline Phosphatase 69 39 - 117 U/L   Total Bilirubin 0.3 0.3 - 1.2 mg/dL   GFR calc  non Af Amer 64 (L) >90 mL/min   GFR calc Af Amer 74 (L) >90 mL/min    Comment: (NOTE) The eGFR has been calculated using the CKD EPI equation. This calculation has not been validated in all clinical situations. eGFR's persistently <90 mL/min signify possible Chronic Kidney Disease.    Anion gap 10 5 - 15  Troponin I     Status: None   Collection Time: 01/05/14  5:03 PM  Result Value Ref Range   Troponin I <0.30 <0.30 ng/mL    Comment:        Due to the release kinetics of cTnI, a negative result within the first hours of the onset of symptoms does not rule out myocardial infarction with certainty. If myocardial infarction is still suspected, repeat the test at appropriate intervals.   Troponin I (q 6hr x 3)     Status: None   Collection Time: 01/05/14 11:53 PM  Result Value Ref Range   Troponin I <0.30 <0.30 ng/mL    Comment:        Due to the release kinetics of cTnI, a negative result within the first hours of the onset of symptoms does not rule out myocardial infarction with certainty. If myocardial infarction is still suspected, repeat the test at appropriate intervals.   D-dimer, quantitative     Status: None   Collection Time: 01/05/14 11:53 PM  Result Value Ref Range   D-Dimer, Quant <0.27 0.00 - 0.48 ug/mL-FEU    Comment:        AT THE INHOUSE ESTABLISHED CUTOFF VALUE OF 0.48 ug/mL FEU, THIS ASSAY HAS BEEN DOCUMENTED IN THE LITERATURE TO HAVE A SENSITIVITY AND NEGATIVE PREDICTIVE VALUE OF AT LEAST 98 TO 99%.  THE TEST RESULT SHOULD BE CORRELATED WITH AN ASSESSMENT OF THE CLINICAL PROBABILITY OF DVT / VTE.   CBC     Status: None   Collection Time: 01/05/14 11:53 PM  Result Value Ref Range   WBC 6.9 4.0 - 10.5 K/uL   RBC 4.19 3.87 - 5.11 MIL/uL   Hemoglobin 12.9 12.0 - 15.0 g/dL   HCT 38.5 36.0 - 46.0 %   MCV 91.9 78.0 - 100.0 fL   MCH 30.8 26.0 - 34.0 pg   MCHC 33.5 30.0 - 36.0 g/dL   RDW 12.8 11.5 - 15.5 %  Platelets 254 150 - 400 K/uL   Creatinine, serum     Status: Abnormal   Collection Time: 01/05/14 11:53 PM  Result Value Ref Range   Creatinine, Ser 0.78 0.50 - 1.10 mg/dL   GFR calc non Af Amer 83 (L) >90 mL/min   GFR calc Af Amer >90 >90 mL/min    Comment: (NOTE) The eGFR has been calculated using the CKD EPI equation. This calculation has not been validated in all clinical situations. eGFR's persistently <90 mL/min signify possible Chronic Kidney Disease.   Troponin I (q 6hr x 3)     Status: None   Collection Time: 01/06/14  4:58 AM  Result Value Ref Range   Troponin I <0.30 <0.30 ng/mL    Comment:        Due to the release kinetics of cTnI, a negative result within the first hours of the onset of symptoms does not rule out myocardial infarction with certainty. If myocardial infarction is still suspected, repeat the test at appropriate intervals.   Comprehensive metabolic panel     Status: Abnormal   Collection Time: 01/06/14  4:58 AM  Result Value Ref Range   Sodium 142 137 - 147 mEq/L   Potassium 3.9 3.7 - 5.3 mEq/L   Chloride 104 96 - 112 mEq/L   CO2 24 19 - 32 mEq/L   Glucose, Bld 98 70 - 99 mg/dL   BUN 17 6 - 23 mg/dL   Creatinine, Ser 0.71 0.50 - 1.10 mg/dL   Calcium 9.5 8.4 - 10.5 mg/dL   Total Protein 6.6 6.0 - 8.3 g/dL   Albumin 3.9 3.5 - 5.2 g/dL   AST 20 0 - 37 U/L   ALT 16 0 - 35 U/L   Alkaline Phosphatase 59 39 - 117 U/L   Total Bilirubin 0.5 0.3 - 1.2 mg/dL   GFR calc non Af Amer 86 (L) >90 mL/min   GFR calc Af Amer >90 >90 mL/min    Comment: (NOTE) The eGFR has been calculated using the CKD EPI equation. This calculation has not been validated in all clinical situations. eGFR's persistently <90 mL/min signify possible Chronic Kidney Disease.    Anion gap 14 5 - 15  CBC with Differential     Status: None   Collection Time: 01/06/14  4:58 AM  Result Value Ref Range   WBC 5.9 4.0 - 10.5 K/uL   RBC 4.18 3.87 - 5.11 MIL/uL   Hemoglobin 12.8 12.0 - 15.0 g/dL   HCT 38.7 36.0 -  46.0 %   MCV 92.6 78.0 - 100.0 fL   MCH 30.6 26.0 - 34.0 pg   MCHC 33.1 30.0 - 36.0 g/dL   RDW 12.9 11.5 - 15.5 %   Platelets 249 150 - 400 K/uL   Neutrophils Relative % 55 43 - 77 %   Neutro Abs 3.2 1.7 - 7.7 K/uL   Lymphocytes Relative 32 12 - 46 %   Lymphs Abs 1.9 0.7 - 4.0 K/uL   Monocytes Relative 10 3 - 12 %   Monocytes Absolute 0.6 0.1 - 1.0 K/uL   Eosinophils Relative 4 0 - 5 %   Eosinophils Absolute 0.2 0.0 - 0.7 K/uL   Basophils Relative 1 0 - 1 %   Basophils Absolute 0.0 0.0 - 0.1 K/uL  Troponin I (q 6hr x 3)     Status: None   Collection Time: 01/06/14 11:20 AM  Result Value Ref Range   Troponin I <0.30 <0.30 ng/mL  Comment:        Due to the release kinetics of cTnI, a negative result within the first hours of the onset of symptoms does not rule out myocardial infarction with certainty. If myocardial infarction is still suspected, repeat the test at appropriate intervals.      HPI :*Denise Ewing is a 68 y.o. female with history of glaucoma has been experiencing chest pain off and on for last 2 weeks. Last evening patient says pain became more constant and had contacted her PCP who advised her to go to the ER. Patient's sister recently passed away from MI 2 weeks ago. Patient also had recently traveled to [facility] last week. Patient denies any shortness of breath productive cough fever or chills. Pain is mostly retrosternal pressure-like nonradiating is no relation to exertion. Presently patient is chest pain-free. EKG and cardiac markers were unremarkable. Given patient's family history of MI and nature of chest pain patient has been admitted for further observation.   HOSPITAL COURSE:  Chest pain with atypical features D-dimer negative Cardiac enzymes negative 2-D echo pending Coronary CT scan results pending Patient awaiting further recommendations from cardiology, anticipate discharge today   Hypertension We'll start the patient on hydrochlorothiazide    Gastroesophageal reflux disease Patient already on omeprazole  Continue   Discharge Exam:    Blood pressure 111/63, pulse 56, temperature 98.5 F (36.9 C), temperature source Oral, resp. rate 20, height 5' 1.5" (1.562 m), weight 65.7 kg (144 lb 13.5 oz), SpO2 96 %.  General: Well-developed and nourished.  Eyes: anicteric no pallor.  ENT: no discharge from the ears eyes nose and mouth.  Neck: no mass felt.  Cardiovascular: S1-S2 heard.  Respiratory: no rhonchi or crepitations.  Abdomen: soft nontender bowel sounds present.  Skin: no rash.  Musculoskeletal:no edema.  Psychiatric:appears normal.  Neurologic: alert awake oriented to time place and person. Moves all extremities.          Discharge Instructions    Diet - low sodium heart healthy    Complete by:  As directed      Increase activity slowly    Complete by:  As directed              Signed: Donette Mainwaring 01/06/2014, 2:15 PM

## 2014-01-06 NOTE — Consult Note (Signed)
Primary Physician: Primary Cardiologist:  HPI: Patient is a 68 yo who we are asked to see re CP  Patient presented yesterday with complaints of CP on and off for 2 wk.   Patient describes pain as a squeezing in chest  Not associated with activity  Intermittent through day.  Has attributed to reflux which she has had in past  In past, though, reflux symptoms only occurred after eating  This is not related to food She does note some increase SOB with activity  Used to be able to walk up drive  Recently had to slow down due to SOB The patient is very anxious  Sister just died of MI (suddenly, while visiting [place]) Autopsy showed atherosclerosis.  Brother also with history of CAD            Past Medical History  Diagnosis Date  . Melanoma of thigh   . GERD (gastroesophageal reflux disease)   . Glaucoma   . History of colonic polyps     Medications Prior to Admission  Medication Sig Dispense Refill  . alendronate (FOSAMAX) 10 MG tablet Take 1 tablet by mouth daily.    [name] aspirin 81 MG tablet Take 81 mg by mouth daily.    . bimatoprost (LUMIGAN) 0.03 % ophthalmic solution 1 drop at bedtime.      . Calcium Citrate-Vitamin D (CITRACAL/VITAMIN D) 250-200 MG-UNIT TABS Take by mouth daily.      . clarithromycin (BIAXIN) 500 MG tablet Take 1 tablet (500 mg total) by mouth 2 (two) times daily. 20 tablet 0  . conjugated estrogens (PREMARIN) vaginal cream Place 1 g vaginally 2 (two) times a week.     [name] GLUCOSAMINE CHONDROITIN COMPLX PO Take by mouth daily.    [name] KRILL OIL 1000 MG CAPS Take 1 capsule by mouth daily.    . Multiple Vitamins-Minerals (ALIVE WOMENS 50+ PO) Take by mouth daily.    . Omeprazole Magnesium (PRILOSEC OTC PO) Take by mouth daily.    . timolol (TIMOPTIC) 0.25 % ophthalmic solution 1 drop 2 (two) times daily.      . traMADol (ULTRAM) 50 MG tablet Take 1 tablet (50 mg total) by mouth every 8 (eight) hours as needed. 45 tablet 0     . aspirin EC  325 mg  Oral Daily  . enoxaparin (LOVENOX) injection  40 mg Subcutaneous Daily  . latanoprost  1 drop Both Eyes QHS  . sodium chloride  3 mL Intravenous Q12H  . sodium chloride  3 mL Intravenous Q12H  . timolol  1 drop Both Eyes BID    Infusions:    Allergies  Allergen Reactions  . Amoxicillin     hives    History   Social History  . Marital Status: Married    Spouse Name: N/A    Number of Children: N/A  . Years of Education: N/A   Occupational History  . Not on file.   Social History Main Topics  . Smoking status: Never Smoker   . Smokeless tobacco: Never Used  . Alcohol Use: Yes     Comment:  occasionally  . Drug Use: No  . Sexual Activity: Not on file   Other Topics Concern  . Not on file   Social History Narrative    Family History  Problem Relation Age of Onset  . Hypertension Mother   . Cancer Mother     breast,anal  . Osteoporosis Mother   . CAD Sister  REVIEW OF SYSTEMS:  All systems reviewed  Negative to the above problem except as noted above.    PHYSICAL EXAM: Filed Vitals:   01/06/14 0418  BP: 123/57  Pulse: 70  Temp: 98.1 F (36.7 C)  Resp: 18    No intake or output data in the 24 hours ending 01/06/14 0906  General:  Well appearing. No respiratory difficulty HEENT: normal Neck: supple. no JVD. Carotids 2+ bilat; no bruits. No lymphadenopathy or thryomegaly appreciated. Cor: PMI nondisplaced. Regular rate & rhythm. No rubs, gallops or murmurs. Lungs: clear Abdomen: soft, nontender, nondistended. No hepatosplenomegaly. No bruits or masses. Good bowel sounds. Extremities: no cyanosis, clubbing, rash, edema Neuro: alert & oriented x 3, cranial nerves grossly intact. moves all 4 extremities w/o difficulty. Affect pleasant.  ECG:  SR 63 bpm.    Results for orders placed or performed during the hospital encounter of 01/05/14 (from the past 24 hour(s))  CBC     Status: None   Collection Time: 01/05/14  5:03 PM  Result Value Ref Range    WBC 6.2 4.0 - 10.5 K/uL   RBC 4.07 3.87 - 5.11 MIL/uL   Hemoglobin 12.6 12.0 - 15.0 g/dL   HCT 38.7 36.0 - 46.0 %   MCV 95.1 78.0 - 100.0 fL   MCH 31.0 26.0 - 34.0 pg   MCHC 32.6 30.0 - 36.0 g/dL   RDW 12.7 11.5 - 15.5 %   Platelets 245 150 - 400 K/uL  Comprehensive metabolic panel     Status: Abnormal   Collection Time: 01/05/14  5:03 PM  Result Value Ref Range   Sodium 139 137 - 147 mEq/L   Potassium 4.5 3.7 - 5.3 mEq/L   Chloride 102 96 - 112 mEq/L   CO2 27 19 - 32 mEq/L   Glucose, Bld 101 (H) 70 - 99 mg/dL   BUN 18 6 - 23 mg/dL   Creatinine, Ser 0.90 0.50 - 1.10 mg/dL   Calcium 9.7 8.4 - 10.5 mg/dL   Total Protein 7.4 6.0 - 8.3 g/dL   Albumin 4.1 3.5 - 5.2 g/dL   AST 23 0 - 37 U/L   ALT 19 0 - 35 U/L   Alkaline Phosphatase 69 39 - 117 U/L   Total Bilirubin 0.3 0.3 - 1.2 mg/dL   GFR calc non Af Amer 64 (L) >90 mL/min   GFR calc Af Amer 74 (L) >90 mL/min   Anion gap 10 5 - 15  Troponin I     Status: None   Collection Time: 01/05/14  5:03 PM  Result Value Ref Range   Troponin I <0.30 <0.30 ng/mL  Troponin I (q 6hr x 3)     Status: None   Collection Time: 01/05/14 11:53 PM  Result Value Ref Range   Troponin I <0.30 <0.30 ng/mL  D-dimer, quantitative     Status: None   Collection Time: 01/05/14 11:53 PM  Result Value Ref Range   D-Dimer, Quant <0.27 0.00 - 0.48 ug/mL-FEU  CBC     Status: None   Collection Time: 01/05/14 11:53 PM  Result Value Ref Range   WBC 6.9 4.0 - 10.5 K/uL   RBC 4.19 3.87 - 5.11 MIL/uL   Hemoglobin 12.9 12.0 - 15.0 g/dL   HCT 38.5 36.0 - 46.0 %   MCV 91.9 78.0 - 100.0 fL   MCH 30.8 26.0 - 34.0 pg   MCHC 33.5 30.0 - 36.0 g/dL   RDW 12.8 11.5 - 15.5 %  Platelets 254 150 - 400 K/uL  Creatinine, serum     Status: Abnormal   Collection Time: 01/05/14 11:53 PM  Result Value Ref Range   Creatinine, Ser 0.78 0.50 - 1.10 mg/dL   GFR calc non Af Amer 83 (L) >90 mL/min   GFR calc Af Amer >90 >90 mL/min  Troponin I (q 6hr x 3)     Status: None    Collection Time: 01/06/14  4:58 AM  Result Value Ref Range   Troponin I <0.30 <0.30 ng/mL  Comprehensive metabolic panel     Status: Abnormal   Collection Time: 01/06/14  4:58 AM  Result Value Ref Range   Sodium 142 137 - 147 mEq/L   Potassium 3.9 3.7 - 5.3 mEq/L   Chloride 104 96 - 112 mEq/L   CO2 24 19 - 32 mEq/L   Glucose, Bld 98 70 - 99 mg/dL   BUN 17 6 - 23 mg/dL   Creatinine, Ser 0.71 0.50 - 1.10 mg/dL   Calcium 9.5 8.4 - 10.5 mg/dL   Total Protein 6.6 6.0 - 8.3 g/dL   Albumin 3.9 3.5 - 5.2 g/dL   AST 20 0 - 37 U/L   ALT 16 0 - 35 U/L   Alkaline Phosphatase 59 39 - 117 U/L   Total Bilirubin 0.5 0.3 - 1.2 mg/dL   GFR calc non Af Amer 86 (L) >90 mL/min   GFR calc Af Amer >90 >90 mL/min   Anion gap 14 5 - 15  CBC with Differential     Status: None   Collection Time: 01/06/14  4:58 AM  Result Value Ref Range   WBC 5.9 4.0 - 10.5 K/uL   RBC 4.18 3.87 - 5.11 MIL/uL   Hemoglobin 12.8 12.0 - 15.0 g/dL   HCT 38.7 36.0 - 46.0 %   MCV 92.6 78.0 - 100.0 fL   MCH 30.6 26.0 - 34.0 pg   MCHC 33.1 30.0 - 36.0 g/dL   RDW 12.9 11.5 - 15.5 %   Platelets 249 150 - 400 K/uL   Neutrophils Relative % 55 43 - 77 %   Neutro Abs 3.2 1.7 - 7.7 K/uL   Lymphocytes Relative 32 12 - 46 %   Lymphs Abs 1.9 0.7 - 4.0 K/uL   Monocytes Relative 10 3 - 12 %   Monocytes Absolute 0.6 0.1 - 1.0 K/uL   Eosinophils Relative 4 0 - 5 %   Eosinophils Absolute 0.2 0.0 - 0.7 K/uL   Basophils Relative 1 0 - 1 %   Basophils Absolute 0.0 0.0 - 0.1 K/uL   Dg Chest 2 View  01/05/2014   CLINICAL DATA:  Mid chest pain.  Left-sided facial numbness.  EXAM: CHEST  2 VIEW  COMPARISON:  None.  FINDINGS: Heart size is normal. No focal consolidations or pleural effusions. Visualized osseous structures have a normal appearance.  IMPRESSION: No active cardiopulmonary disease.   Electronically Signed   By: Shon Hale M.D.   On: 01/05/2014 17:25     ASSESSMENT: Patient is a 68 yo admitted with CP  Pain has some atypical  features.  But, different than usual reflux  And has had some SOB  She has been anxious lately with death of sister (who was healthy prior). SHe has r/o for MI  D dimer negative (recent travel, though symptoms preceded travel)    Would recomm evaluatingwith CT angio of heart to determine CAD/CAD burden. Will give 50 mg metoprolol to slow HR  2.  HCM  Patient reports lipids are good.

## 2014-01-09 ENCOUNTER — Telehealth: Payer: Self-pay

## 2014-01-09 NOTE — Telephone Encounter (Signed)
Admit date: 01/05/2014 Discharge date: 01/06/2014 @ 6:30 pm  Reason for admission:  Chest Pain   Follow-up recommendations follow-up with PCP in 5-7 days: PCP to follow-up on the results of the 2-D echo  Transition Care Management Follow-up Telephone Call  How have you been since you were released from the hospital?  No chest pain/pressure.  Acid reflux is worse.  Burning sensation up and down sternal with upset stomach.  Frequent burping.  Omeprazole and Tums not working at  All.  Denies shortness of breath, arm pain, abdominal pain, back pain, diaphoresis, or vomiting.     Do you understand why you were in the hospital? yes   Do you understand the discharge instructions? yes  Items Reviewed:  Medications reviewed: yes, pt was started on HCTZ, but was instructed not to start medication until seen by PCP.    Allergies reviewed: yes  Dietary changes reviewed: yes, low sodium, heart healthy  Referrals reviewed: none   Functional Questionnaire:   Activities of Daily Living (ADLs):   She states they are independent in the following: ambulation, bathing and hygiene, feeding, continence, grooming, toileting and dressing States they require assistance with the following: None   Any transportation issues/concerns?: no   Any patient concerns?  Acid reflux.  Please advise.     Confirmed importance and date/time of follow-up visits scheduled: yes   Confirmed with patient if condition begins to worsen call PCP or go to the ER.  Patient was given the Call-a-Nurse line [phone]: yes  Hospital follow up appointment scheduled with Dr. Birdie Riddle on Wednesday, 01/11/14 @ 11 am.

## 2014-01-11 ENCOUNTER — Ambulatory Visit (INDEPENDENT_AMBULATORY_CARE_PROVIDER_SITE_OTHER): Payer: Medicare Other | Admitting: Family Medicine

## 2014-01-11 ENCOUNTER — Encounter: Payer: Self-pay | Admitting: Family Medicine

## 2014-01-11 VITALS — BP 118/74 | HR 69 | Temp 97.9°F | Resp 16 | Wt 144.2 lb

## 2014-01-11 DIAGNOSIS — Z23 Encounter for immunization: Secondary | ICD-10-CM

## 2014-01-11 DIAGNOSIS — I2510 Atherosclerotic heart disease of native coronary artery without angina pectoris: Secondary | ICD-10-CM | POA: Insufficient documentation

## 2014-01-11 DIAGNOSIS — K2100 Gastro-esophageal reflux disease with esophagitis, without bleeding: Secondary | ICD-10-CM

## 2014-01-11 MED ORDER — PANTOPRAZOLE SODIUM 40 MG PO TBEC: 40.0000 mg | DELAYED_RELEASE_TABLET | Freq: Every day | ORAL | Status: DC

## 2014-01-11 MED ORDER — SUCRALFATE 1 G PO TABS: 1.0000 g | ORAL_TABLET | Freq: Three times a day (TID) | ORAL | Status: DC

## 2014-01-11 NOTE — Progress Notes (Signed)
Pre visit review using our clinic review tool, if applicable. No additional management support is needed unless otherwise documented below in the visit note. 

## 2014-01-11 NOTE — Assessment & Plan Note (Signed)
New.  Pt has blockage noted on cardiac CT and calcium score that puts her at higher risk.  Has f/u scheduled w/ Dr Harrington Challenger.  Now on daily ASA.  Already on statin.  Not a smoker.  Will continue to follow.

## 2014-01-11 NOTE — Patient Instructions (Addendum)
Follow up as scheduled No need for hydrochlorothiazide Continue the Aspirin daily START the Protonix daily Use the Carafate 3x/day x10 days to allow the stomach lining time to heal Call with any questions or concerns Happy Holidays!!!

## 2014-01-11 NOTE — Assessment & Plan Note (Signed)
Deteriorated.  Switch to protonix.  Add carafate to allow healing.  Reviewed dietary and lifestyle modifications.  If no improvement, will need GI referral for endoscopy.  Pt expressed understanding and is in agreement w/ plan.

## 2014-01-11 NOTE — Progress Notes (Signed)
   Subjective:    Patient ID: Denise Ewing, female    DOB: 1946-01-24, 68 y.o.   MRN: [id number]  [facility] f/u- pt was admitted on 11/12 and d/c'd on 11/13 for CP.  Had normal d-dimer, CE's, ECHO.  Pt's younger sister died suddenly and unexpectedly from MI.  Pt denies current CP.  + GERD.  Pt had CT angiogram that showed 50% blockage of proximal LAD.  Also w/ coronary Ca score in 77th percentile.  Now on 81mg  daily.  Pt reports prior to admission she was having difficulty drinking wine, drinking coffee.  Taking Omeprazole BID and Tums in between w/o relief.   Review of Systems For ROS see HPI     Objective:   Physical Exam  Constitutional: She is oriented to person, place, and time. She appears well-developed and well-nourished. No distress.  HENT:  Head: Normocephalic and atraumatic.  Eyes: Conjunctivae and EOM are normal. Pupils are equal, round, and reactive to light.  Neck: Normal range of motion. Neck supple. No thyromegaly present.  Cardiovascular: Normal rate, regular rhythm, normal heart sounds and intact distal pulses.   No murmur heard. Pulmonary/Chest: Effort normal and breath sounds normal. No respiratory distress.  Abdominal: Soft. She exhibits no distension. There is no tenderness.  Musculoskeletal: She exhibits no edema.  Lymphadenopathy:    She has no cervical adenopathy.  Neurological: She is alert and oriented to person, place, and time.  Skin: Skin is warm and dry.  Psychiatric: She has a normal mood and affect. Her behavior is normal.  Vitals reviewed.         Assessment & Plan:

## 2014-01-13 ENCOUNTER — Encounter: Payer: Self-pay | Admitting: Internal Medicine

## 2014-01-13 ENCOUNTER — Ambulatory Visit (INDEPENDENT_AMBULATORY_CARE_PROVIDER_SITE_OTHER): Payer: Medicare Other | Admitting: Internal Medicine

## 2014-01-13 VITALS — BP 120/74 | HR 62 | Ht 61.5 in | Wt 144.8 lb

## 2014-01-13 DIAGNOSIS — I2510 Atherosclerotic heart disease of native coronary artery without angina pectoris: Secondary | ICD-10-CM

## 2014-01-13 NOTE — Progress Notes (Signed)
HPI Patinet is a 68 yo who was just discharged from the hosp  She was admitted for CP  Note sister had died suddenly in the wks prior. Pain was somewhat atypical but concerning given family history   Cardiac CT angio done  Revealing 50% LAD lesion Patient put on acid inhibitors and sent home   Since d/c she has been feelng better  Chest feeling better  Denies CP    Breathig is good  Active   Allergies  Allergen Reactions  . Amoxicillin Hives    Current Outpatient Prescriptions  Medication Sig Dispense Refill  . atorvastatin (LIPITOR) 40 MG tablet Take 1 tablet (40 mg total) by mouth daily. 30 tablet 2  . bimatoprost (LUMIGAN) 0.01 % SOLN Place 1 drop into both eyes at bedtime.    . Calcium Citrate-Vitamin D (CITRACAL/VITAMIN D) 250-200 MG-UNIT TABS Take 2 tablets by mouth at bedtime.     . Coenzyme Q10 (CO Q-10 PO) Take 1 capsule by mouth every morning.    . conjugated estrogens (PREMARIN) vaginal cream Place 1.5 g vaginally every Friday.     [name] GLUCOSAMINE CHONDROITIN COMPLX PO Take 2 tablets by mouth every morning.     . Multiple Vitamins-Minerals (ALIVE WOMENS 50+ PO) Take 1 tablet by mouth every morning.    [name] OVER THE COUNTER MEDICATION Take 1 tablet by mouth 2 (two) times daily. Tumeric and cumin    . pantoprazole (PROTONIX) 40 MG tablet Take 1 tablet (40 mg total) by mouth daily. 30 tablet 3  . sucralfate (CARAFATE) 1 G tablet Take 1 tablet (1 g total) by mouth 4 (four) times daily -  with meals and at bedtime. 60 tablet 0  . timolol (BETIMOL) 0.5 % ophthalmic solution Place 1 drop into the left eye 2 (two) times daily.    . hydrochlorothiazide (MICROZIDE) 12.5 MG capsule Take 12.5 mg by mouth 2 (two) times daily.     No current facility-administered medications for this visit.    Past Medical History  Diagnosis Date  . Melanoma of thigh   . GERD (gastroesophageal reflux disease)   . Glaucoma   . History of colonic polyps     Past Surgical History  Procedure Laterality  Date  . Melanoma removed      R thigh  . Colon polyps removed    . Total abdominal hysterectomy w/ bilateral salpingoophorectomy      uterine fibroids    Family History  Problem Relation Age of Onset  . Hypertension Mother   . Cancer Mother     breast,anal  . Osteoporosis Mother   . CAD Sister     History   Social History  . Marital Status: Married    Spouse Name: N/A    Number of Children: N/A  . Years of Education: N/A   Occupational History  . Not on file.   Social History Main Topics  . Smoking status: Never Smoker   . Smokeless tobacco: Never Used  . Alcohol Use: Yes     Comment:  occasionally  . Drug Use: No  . Sexual Activity: Not on file   Other Topics Concern  . Not on file   Social History Narrative    Review of Systems:  All systems reviewed.  They are negative to the above problem except as previously stated.  Vital Signs: BP 120/74 mmHg  Pulse 62  Ht 5' 1.5" (1.562 m)  Wt 144 lb 12.8 oz (65.681 kg)  BMI 26.92 kg/m2  Physical Exam Patient is in NAD   HEENT:  Normocephalic, atraumatic. EOMI, PERRLA.  Neck: JVP is normal.  No bruits.  Lungs: clear to auscultation. No rales no wheezes.  Heart: Regular rate and rhythm. Normal S1, S2. No S3.   No significant murmurs. PMI not displaced.  Abdomen:  Supple, nontender. Normal bowel sounds. No masses. No hepatomegaly.  Extremities:   Good distal pulses throughout. No lower extremity edema.  Musculoskeletal :moving all extremities.  Neuro:   alert and oriented x3.  CN II-XII grossly intact.   Assessment and Plan:  1.  CAD  Patient with 50% LAD lesion  I do not think it is cause of symptoms  Will continue risk factor modificaton  2.  CP  Prob due to GI  Improved on acid inhibition  3.  HL  Will check lipids inJan now that on lipitor

## 2014-01-13 NOTE — Patient Instructions (Signed)
Your physician recommends that you continue on your current medications as directed. Please refer to the Current Medication list given to you today. Your physician recommends that you return for lab work in: January 2016 (LIPIDS, AST) Your physician wants you to follow-up in: April 2016 [place].  You will receive a reminder letter in the mail two months in advance. If you don't receive a letter, please call our office to schedule the follow-up appointment.

## 2014-01-16 ENCOUNTER — Encounter: Payer: Self-pay | Admitting: Family Medicine

## 2014-02-04 DIAGNOSIS — I2510 Atherosclerotic heart disease of native coronary artery without angina pectoris: Secondary | ICD-10-CM

## 2014-02-04 DIAGNOSIS — Z23 Encounter for immunization: Secondary | ICD-10-CM

## 2014-02-04 DIAGNOSIS — K210 Gastro-esophageal reflux disease with esophagitis: Secondary | ICD-10-CM

## 2014-03-16 ENCOUNTER — Ambulatory Visit (INDEPENDENT_AMBULATORY_CARE_PROVIDER_SITE_OTHER): Payer: Medicare Other | Admitting: Family Medicine

## 2014-03-16 ENCOUNTER — Encounter: Payer: Self-pay | Admitting: Family Medicine

## 2014-03-16 VITALS — BP 114/73 | HR 61 | Temp 98.3°F | Resp 14 | Wt 145.4 lb

## 2014-03-16 DIAGNOSIS — T07XXXA Unspecified multiple injuries, initial encounter: Secondary | ICD-10-CM

## 2014-03-16 DIAGNOSIS — W541XXA Struck by dog, initial encounter: Secondary | ICD-10-CM

## 2014-03-16 DIAGNOSIS — T148 Other injury of unspecified body region: Secondary | ICD-10-CM

## 2014-03-16 MED ORDER — CYCLOBENZAPRINE HCL 10 MG PO TABS: 10.0000 mg | ORAL_TABLET | Freq: Three times a day (TID) | ORAL | Status: DC | PRN

## 2014-03-16 MED ORDER — HYDROCODONE-ACETAMINOPHEN 5-325 MG PO TABS: 1.0000 | ORAL_TABLET | Freq: Four times a day (QID) | ORAL | Status: DC | PRN

## 2014-03-16 NOTE — Progress Notes (Signed)
Pre visit review using our clinic review tool, if applicable. No additional management support is needed unless otherwise documented below in the visit note. 

## 2014-03-16 NOTE — Patient Instructions (Signed)
Follow up as needed Use the Flexeril as needed before bed for muscle spasm- will cause drowiness Use Ibuprofen (Advil) 600mg  (3 of the OTC tabs) 3x/day for pain and inflammation- take w/ food Use the Hydrocodone for severe pain ICE the knee HEAT the breast and chest Call with any questions or concerns Hang in there!!!

## 2014-03-16 NOTE — Progress Notes (Signed)
   Subjective:    Patient ID: Denise Ewing, female    DOB: Feb 09, 1946, 69 y.o.   MRN: [id number]  HPI Fall- pt was knocked forward by charging dogs, landing on L knee, R breast/chest, R arm, R upper lip.  No SOB.  Having a lot of discomfort moving R arm, rolling over in bed.   Review of Systems For ROS see HPI     Objective:   Physical Exam  Constitutional: She is oriented to person, place, and time. She appears well-developed and well-nourished. No distress.  HENT:  Head: Normocephalic.  Nose: Nose normal.  Mild bruising over R upper lip  Eyes: Conjunctivae and EOM are normal. Pupils are equal, round, and reactive to light.  Musculoskeletal:  Full ROM of L knee Full ROM of R shoulder  Neurological: She is alert and oriented to person, place, and time. She has normal reflexes. Coordination normal.  Skin: Skin is warm and dry.  Bruising over L knee Bruising over R anterior chest wall  Vitals reviewed.         Assessment & Plan:

## 2014-03-18 DIAGNOSIS — W541XXA Struck by dog, initial encounter: Secondary | ICD-10-CM | POA: Insufficient documentation

## 2014-03-18 DIAGNOSIS — T07XXXA Unspecified multiple injuries, initial encounter: Secondary | ICD-10-CM | POA: Insufficient documentation

## 2014-03-18 NOTE — Assessment & Plan Note (Signed)
New.  Pt was knocked down by her 2 dogs and suffered bruising but thankfully no bony or joint injuries.  Pain meds prn.  Reviewed supportive care and red flags that should prompt return.  Pt expressed understanding and is in agreement w/ plan.

## 2014-03-18 NOTE — Assessment & Plan Note (Signed)
New.  No evidence of bony injury but multiple bruises.  Knee and shoulder motion WNL.  Reviewed supportive care.  Pain meds prn.  Pt expressed understanding and is in agreement w/ plan.

## 2014-04-08 ENCOUNTER — Other Ambulatory Visit: Payer: Self-pay | Admitting: Internal Medicine

## 2014-04-18 ENCOUNTER — Encounter: Payer: Self-pay | Admitting: Family Medicine

## 2014-04-18 ENCOUNTER — Ambulatory Visit (INDEPENDENT_AMBULATORY_CARE_PROVIDER_SITE_OTHER): Payer: Medicare Other | Admitting: Family Medicine

## 2014-04-18 VITALS — BP 116/76 | HR 64 | Temp 97.9°F | Resp 16 | Ht 61.75 in | Wt 144.5 lb

## 2014-04-18 DIAGNOSIS — M810 Age-related osteoporosis without current pathological fracture: Secondary | ICD-10-CM | POA: Insufficient documentation

## 2014-04-18 DIAGNOSIS — Z0000 Encounter for general adult medical examination without abnormal findings: Secondary | ICD-10-CM

## 2014-04-18 DIAGNOSIS — M8580 Other specified disorders of bone density and structure, unspecified site: Secondary | ICD-10-CM | POA: Diagnosis not present

## 2014-04-18 DIAGNOSIS — E785 Hyperlipidemia, unspecified: Secondary | ICD-10-CM | POA: Insufficient documentation

## 2014-04-18 LAB — HEPATIC FUNCTION PANEL
ALT: 17 U/L (ref 0–35)
AST: 24 U/L (ref 0–37)
Albumin: 4.6 g/dL (ref 3.5–5.2)
Alkaline Phosphatase: 68 U/L (ref 39–117)
Bilirubin, Direct: 0.1 mg/dL (ref 0.0–0.3)
TOTAL PROTEIN: 7.3 g/dL (ref 6.0–8.3)
Total Bilirubin: 0.5 mg/dL (ref 0.2–1.2)

## 2014-04-18 LAB — CBC WITH DIFFERENTIAL/PLATELET
Basophils Absolute: 0 10*3/uL (ref 0.0–0.1)
Basophils Relative: 0.5 % (ref 0.0–3.0)
EOS PCT: 5 % (ref 0.0–5.0)
Eosinophils Absolute: 0.3 10*3/uL (ref 0.0–0.7)
HEMATOCRIT: 38.6 % (ref 36.0–46.0)
Hemoglobin: 13.3 g/dL (ref 12.0–15.0)
LYMPHS ABS: 1.6 10*3/uL (ref 0.7–4.0)
Lymphocytes Relative: 31.3 % (ref 12.0–46.0)
MCHC: 34.4 g/dL (ref 30.0–36.0)
MCV: 89.7 fl (ref 78.0–100.0)
Monocytes Absolute: 0.4 10*3/uL (ref 0.1–1.0)
Monocytes Relative: 7.5 % (ref 3.0–12.0)
NEUTROS PCT: 55.7 % (ref 43.0–77.0)
Neutro Abs: 2.8 10*3/uL (ref 1.4–7.7)
PLATELETS: 253 10*3/uL (ref 150.0–400.0)
RBC: 4.3 Mil/uL (ref 3.87–5.11)
RDW: 13.2 % (ref 11.5–15.5)
WBC: 5 10*3/uL (ref 4.0–10.5)

## 2014-04-18 LAB — LIPID PANEL
Cholesterol: 177 mg/dL (ref 0–200)
HDL: 52.9 mg/dL (ref >39.00)
LDL Cholesterol: 106 mg/dL — ABNORMAL HIGH (ref 0–99)
NonHDL: 124.1
Total CHOL/HDL Ratio: 3
Triglycerides: 93 mg/dL (ref 0.0–149.0)
VLDL: 18.6 mg/dL (ref 0.0–40.0)

## 2014-04-18 LAB — BASIC METABOLIC PANEL
BUN: 12 mg/dL (ref 6–23)
CALCIUM: 9.8 mg/dL (ref 8.4–10.5)
CO2: 32 mEq/L (ref 19–32)
CREATININE: 0.71 mg/dL (ref 0.40–1.20)
Chloride: 103 mEq/L (ref 96–112)
GFR: 86.66 mL/min (ref >60.00)
Glucose, Bld: 105 mg/dL — ABNORMAL HIGH (ref 70–99)
Potassium: 4.5 mEq/L (ref 3.5–5.1)
SODIUM: 137 meq/L (ref 135–145)

## 2014-04-18 LAB — TSH: TSH: 2.77 u[IU]/mL (ref 0.35–4.50)

## 2014-04-18 LAB — VITAMIN D 25 HYDROXY (VIT D DEFICIENCY, FRACTURES): VITD: 46.61 ng/mL (ref 30.00–100.00)

## 2014-04-18 MED ORDER — ATORVASTATIN CALCIUM 40 MG PO TABS: 40.0000 mg | ORAL_TABLET | Freq: Every day | ORAL | Status: DC

## 2014-04-18 NOTE — Assessment & Plan Note (Signed)
New problem for pt.  Was started on meds due to sister's recent and sudden death.  Will refill for pt until she sees Cardiology (they started med).  Check labs.  Will follow.

## 2014-04-18 NOTE — Progress Notes (Signed)
   Subjective:    Patient ID: Denise Ewing, female    DOB: [birth year], 69 y.o.   MRN: [id number]  HPI Here today for CPE.  Risk Factors: GERD- chronic problem, on Protonix daily w/ good control Hyperlipidemia- new problem now that sister died suddenly of aneurysm.  Cards started her on this for prevention.   Physical Activity: exercising regularly, walking more Fall Risk: low risk- recent fall had obvious cause Depression: denies current sxs Hearing: normal to conversational tones and whispered voice at 6 ft ADL's: independent Cognitive: normal linear thought process, memory and attention intact Home Safety: safe at home, lives w/ husband. Height, Weight, BMI, Visual Acuity: see vitals, vision corrected to 20/20 w/ glasses Counseling: UTD on colonoscopy, mammo, DEXA.  No need for pap due to hysterectomy Labs Ordered: See A&P Care Plan: See A&P    Review of Systems Patient reports no vision/ hearing changes, adenopathy,fever, weight change,  persistant/recurrent hoarseness, swallowing issues, chest pain, palpitations, edema, persistant/recurrent cough, hemoptysis, dyspnea (rest/exertional/paroxysmal nocturnal), gastrointestinal bleeding (melena, rectal bleeding), abdominal pain, significant heartburn, bowel changes, GU symptoms (dysuria, hematuria, incontinence), Gyn symptoms (abnormal  bleeding, pain),  syncope, focal weakness, memory loss, numbness & tingling, skin/hair/nail changes, abnormal bruising or bleeding, anxiety, or depression.   Reviewed meds, allergies, problem list, and PMH in chart     Objective:   Physical Exam General Appearance:    Alert, cooperative, no distress, appears stated age  Head:    Normocephalic, without obvious abnormality, atraumatic  Eyes:    PERRL, conjunctiva/corneas clear, EOM's intact, fundi    benign, both eyes  Ears:    Normal TM's and external ear canals, both ears  Nose:   Nares normal, septum midline, mucosa normal, no drainage    or sinus  tenderness  Throat:   Lips, mucosa, and tongue normal; teeth and gums normal  Neck:   Supple, symmetrical, trachea midline, no adenopathy;    Thyroid: no enlargement/tenderness/nodules  Back:     Symmetric, no curvature, ROM normal, no CVA tenderness  Lungs:     Clear to auscultation bilaterally, respirations unlabored  Chest Wall:    No tenderness or deformity   Heart:    Regular rate and rhythm, S1 and S2 normal, no murmur, rub   or gallop  Breast Exam:    Deferred to GYN  Abdomen:     Soft, non-tender, bowel sounds active all four quadrants,    no masses, no organomegaly  Genitalia:    Deferred to GYN  Rectal:    Extremities:   Extremities normal, atraumatic, no cyanosis or edema  Pulses:   2+ and symmetric all extremities  Skin:   Skin color, texture, turgor normal, no rashes or lesions  Lymph nodes:   Cervical, supraclavicular, and axillary nodes normal  Neurologic:   CNII-XII intact, normal strength, sensation and reflexes    throughout          Assessment & Plan:

## 2014-04-18 NOTE — Patient Instructions (Signed)
Follow up in 6 months to recheck cholesterol We'll notify you of your lab results and make any changes if needed Keep up the good work!  You look great! Call with any questions or concerns Happy Spring!!! 

## 2014-04-18 NOTE — Progress Notes (Signed)
Pre visit review using our clinic review tool, if applicable. No additional management support is needed unless otherwise documented below in the visit note. 

## 2014-04-18 NOTE — Assessment & Plan Note (Signed)
New to provider, ongoing for pt.  Following w/ GYN.  Check Vit D level.  Replete prn.

## 2014-04-18 NOTE — Assessment & Plan Note (Addendum)
Pt's PE WNL.  UTD on GYN, colonoscopy, mammo, DEXA.  Written screening schedule updated and given to pt.  Check labs.  Anticipatory guidance provided on healthy diet and regular exercise.

## 2014-05-09 ENCOUNTER — Other Ambulatory Visit: Payer: Self-pay | Admitting: Family Medicine

## 2014-06-30 ENCOUNTER — Encounter: Payer: Self-pay | Admitting: Internal Medicine

## 2014-06-30 ENCOUNTER — Ambulatory Visit (INDEPENDENT_AMBULATORY_CARE_PROVIDER_SITE_OTHER): Payer: Medicare Other | Admitting: Internal Medicine

## 2014-06-30 VITALS — BP 118/60 | HR 61 | Ht 61.5 in | Wt 146.2 lb

## 2014-06-30 DIAGNOSIS — E785 Hyperlipidemia, unspecified: Secondary | ICD-10-CM

## 2014-06-30 NOTE — Progress Notes (Signed)
Cardiology Office Note   Date:  06/30/2014   ID:  Denise Ewing, DOB 1946-01-14, MRN [id number]  PCP:  Annye Asa, MD  Cardiologist:   Dorris Carnes, MD   No chief complaint on file.     History of Present Illness: Denise Ewing is a 69 y.o. female with a history of CAD by CT angiogram (50% LAD lesion).  She has had atypical CP I saw her in NOvmber  Her sister had died earlier in year suddenly.    Since I saw her she denies CP  Says she has been feeling good         Current Outpatient Prescriptions  Medication Sig Dispense Refill  . aspirin 81 MG tablet Take 81 mg by mouth daily.    [name] atorvastatin (LIPITOR) 40 MG tablet Take 1 tablet (40 mg total) by mouth daily. 30 tablet 2  . Calcium Citrate-Vitamin D (CITRACAL/VITAMIN D) 250-200 MG-UNIT TABS Take 2 tablets by mouth at bedtime.     . Coenzyme Q10 (CO Q-10 PO) Take 1 capsule by mouth every morning.    . conjugated estrogens (PREMARIN) vaginal cream Place 1.5 g vaginally every Friday.     . Cyanocobalamin (B-12 PO) Take 1 tablet by mouth daily.     [name] GLUCOSAMINE CHONDROITIN COMPLX PO Take 2 tablets by mouth every morning.     . latanoprost (XALATAN) 0.005 % ophthalmic solution Place 1 drop into both eyes at bedtime.   0  . Magnesium 200 MG TABS Take 200 mg by mouth daily.     . Multiple Vitamins-Minerals (ALIVE WOMENS 50+ PO) Take 1 tablet by mouth every morning.    [name] OVER THE COUNTER MEDICATION Take 1 tablet by mouth 2 (two) times daily. Tumeric and cumin    . pantoprazole (PROTONIX) 40 MG tablet TAKE ONE TABLET BY MOUTH ONCE DAILY 30 tablet 6  . timolol (BETIMOL) 0.5 % ophthalmic solution Place 1 drop into the left eye 2 (two) times daily.     No current facility-administered medications for this visit.    Allergies:   Amoxicillin   Past Medical History  Diagnosis Date  . Melanoma of thigh   . GERD (gastroesophageal reflux disease)   . Glaucoma   . History of colonic polyps     Past Surgical History    Procedure Laterality Date  . Melanoma removed      R thigh  . Colon polyps removed    . Total abdominal hysterectomy w/ bilateral salpingoophorectomy      uterine fibroids     Social History:  The patient  reports that she has never smoked. She has never used smokeless tobacco. She reports that she drinks alcohol. She reports that she does not use illicit drugs.   Family History:  The patient's family history includes CAD in her sister; Cancer in her mother; Hypertension in her mother; Osteoporosis in her mother.    ROS:  Please see the history of present illness. All other systems are reviewed and  Negative to the above problem except as noted.    PHYSICAL EXAM: VS:  BP 118/60 mmHg  Pulse 61  Ht 5' 1.5" (1.562 m)  Wt 146 lb 3.2 oz (66.316 kg)  BMI 27.18 kg/m2  GEN: Well nourished, well developed, in no acute distress HEENT: normal Neck: no JVD, carotid bruits, or masses Cardiac: RRR; no murmurs, rubs, or gallops,no edema  Respiratory:  clear to auscultation bilaterally, normal work of breathing GI: soft, nontender, nondistended, + BS  No hepatomegaly  MS: no deformity Moving all extremities   Skin: warm and dry, no rash Neuro:  Strength and sensation are intact Psych: euthymic mood, full affect   EKG:  EKG is not ordered today.   Lipid Panel    Component Value Date/Time   CHOL 177 04/18/2014 1048   TRIG 93.0 04/18/2014 1048   HDL 52.90 04/18/2014 1048   CHOLHDL 3 04/18/2014 1048   VLDL 18.6 04/18/2014 1048   LDLCALC 106* 04/18/2014 1048      Wt Readings from Last 3 Encounters:  06/30/14 146 lb 3.2 oz (66.316 kg)  04/18/14 144 lb 8 oz (65.545 kg)  03/16/14 145 lb 6.4 oz (65.953 kg)      ASSESSMENT AND PLAN: 1.  CAD  Doing good  Follow    2.  HL  Latest lipids LDL was 106  Needs to be lower.  Would add Zetia to regimen  Will f/u later this summer.     Disposition:   FU with me next year    Signed, Dorris Carnes, MD  06/30/2014 11:42 AM    [facility] [place], [place], [place]  [address] Phone: [phone]; Fax: [phone]

## 2014-06-30 NOTE — Patient Instructions (Signed)
Medication Instructions:  Zetia 10 mg samples given today.  Labwork: Your physician recommends that you return for lab work in: Fasting Lipids (6/10)   Testing/Procedures: NONE  Follow-Up: Your physician wants you to follow-up in: 9 months with Dr. Harrington Challenger. You will receive a reminder letter in the mail two months in advance. If you don't receive a letter, please call our office to schedule the follow-up appointment.   Any Other Special Instructions Will Be Listed Below (If Applicable).

## 2014-07-17 ENCOUNTER — Other Ambulatory Visit: Payer: Self-pay | Admitting: Family Medicine

## 2014-07-17 NOTE — Telephone Encounter (Signed)
Med filled.  

## 2014-08-04 ENCOUNTER — Other Ambulatory Visit (INDEPENDENT_AMBULATORY_CARE_PROVIDER_SITE_OTHER): Payer: Medicare Other | Admitting: *Deleted

## 2014-08-04 ENCOUNTER — Other Ambulatory Visit: Payer: Medicare Other

## 2014-08-04 DIAGNOSIS — E785 Hyperlipidemia, unspecified: Secondary | ICD-10-CM

## 2014-08-04 LAB — LIPID PANEL
Cholesterol: 96 mg/dL (ref 0–200)
HDL: 41.8 mg/dL (ref >39.00)
LDL Cholesterol: 41 mg/dL (ref 0–99)
NonHDL: 54.2
Total CHOL/HDL Ratio: 2
Triglycerides: 65 mg/dL (ref 0.0–149.0)
VLDL: 13 mg/dL (ref 0.0–40.0)

## 2014-08-10 ENCOUNTER — Other Ambulatory Visit: Payer: Medicare Other

## 2014-08-16 ENCOUNTER — Other Ambulatory Visit: Payer: Self-pay | Admitting: *Deleted

## 2014-08-16 DIAGNOSIS — E785 Hyperlipidemia, unspecified: Secondary | ICD-10-CM

## 2014-08-16 MED ORDER — EZETIMIBE 10 MG PO TABS: 5.0000 mg | ORAL_TABLET | Freq: Every day | ORAL | Status: DC

## 2014-11-27 ENCOUNTER — Ambulatory Visit: Payer: Medicare Other

## 2014-11-28 ENCOUNTER — Ambulatory Visit (INDEPENDENT_AMBULATORY_CARE_PROVIDER_SITE_OTHER): Payer: Medicare Other

## 2014-11-28 DIAGNOSIS — Z23 Encounter for immunization: Secondary | ICD-10-CM

## 2014-12-05 ENCOUNTER — Other Ambulatory Visit: Payer: Self-pay | Admitting: Family Medicine

## 2014-12-06 NOTE — Telephone Encounter (Signed)
Medication filled to pharmacy as requested.   

## 2014-12-07 ENCOUNTER — Other Ambulatory Visit: Payer: Medicare Other

## 2014-12-14 ENCOUNTER — Other Ambulatory Visit: Payer: Medicare Other

## 2014-12-21 ENCOUNTER — Other Ambulatory Visit (INDEPENDENT_AMBULATORY_CARE_PROVIDER_SITE_OTHER): Payer: Medicare Other | Admitting: *Deleted

## 2014-12-21 DIAGNOSIS — E785 Hyperlipidemia, unspecified: Secondary | ICD-10-CM | POA: Diagnosis not present

## 2014-12-21 NOTE — Addendum Note (Signed)
Addended by: Eulis Foster on: 12/21/2014 08:23 AM   Modules accepted: Orders

## 2014-12-25 LAB — CARDIO IQ(R) ADVANCED LIPID PANEL
Apolipoprotein B: 40 mg/dL — ABNORMAL LOW (ref 49–103)
CHOLESTEROL/HDL RATIO (CARDIO IQ ADV LIPID PANEL): 2.1 calc (ref <=5.0)
Cholesterol, Total: 96 mg/dL — ABNORMAL LOW (ref 125–200)
HDL Cholesterol: 45 mg/dL — ABNORMAL LOW (ref >=46)
LDL LARGE: 5364 nmol/L (ref 5038–17886)
LDL MEDIUM: 109 nmol/L — AB (ref 121–397)
LDL PEAK SIZE: 218.1 Angstrom — AB (ref >=218.2)
LDL Particle Number: 656 nmol/L — ABNORMAL LOW (ref 1016–2185)
LDL Small: 102 nmol/L — ABNORMAL LOW (ref 115–386)
LDL, Calculated: 38 mg/dL
LIPOPROTEIN (A) (CARDIO IQ ADV LIPID PANEL): 14 nmol/L (ref <75)
Non-HDL Cholesterol: 51 mg/dL
Triglycerides: 66 mg/dL

## 2015-01-16 LAB — HM DEXA SCAN

## 2015-01-16 LAB — HM MAMMOGRAPHY

## 2015-01-24 ENCOUNTER — Encounter: Payer: Self-pay | Admitting: General Practice

## 2015-02-05 ENCOUNTER — Other Ambulatory Visit: Payer: Self-pay | Admitting: Family Medicine

## 2015-02-05 NOTE — Telephone Encounter (Signed)
Medication filled to pharmacy as requested.   

## 2015-02-10 ENCOUNTER — Other Ambulatory Visit: Payer: Self-pay | Admitting: Family Medicine

## 2015-02-12 LAB — HM PAP SMEAR: HM PAP: NORMAL

## 2015-02-12 NOTE — Telephone Encounter (Signed)
Medication filled to pharmacy as requested.   

## 2015-03-07 ENCOUNTER — Other Ambulatory Visit: Payer: Self-pay | Admitting: Family Medicine

## 2015-03-09 ENCOUNTER — Other Ambulatory Visit: Payer: Self-pay | Admitting: Family Medicine

## 2015-03-09 NOTE — Telephone Encounter (Signed)
Pt husband called to schedule f/u appt as previously noted to pharmacy. Scheduled for 03/15/15. Pt only has 1 dose of pantoprazole left. Please send RX to [facility] - [place], [place] - [address]. Dominica Severin asked that we send pt mychart msg when the RX is sent to the pharmacy.

## 2015-03-09 NOTE — Telephone Encounter (Signed)
Rx filled to pharmacy as requested. Pt notified via MyChart as requested.

## 2015-03-13 ENCOUNTER — Other Ambulatory Visit: Payer: Self-pay | Admitting: Family Medicine

## 2015-03-15 ENCOUNTER — Other Ambulatory Visit: Payer: Self-pay

## 2015-03-15 ENCOUNTER — Encounter: Payer: Self-pay | Admitting: Family Medicine

## 2015-03-15 ENCOUNTER — Ambulatory Visit (INDEPENDENT_AMBULATORY_CARE_PROVIDER_SITE_OTHER): Payer: Medicare Other | Admitting: Family Medicine

## 2015-03-15 VITALS — BP 116/61 | HR 59 | Temp 98.0°F | Ht 62.0 in | Wt 147.6 lb

## 2015-03-15 DIAGNOSIS — E785 Hyperlipidemia, unspecified: Secondary | ICD-10-CM

## 2015-03-15 DIAGNOSIS — R945 Abnormal results of liver function studies: Secondary | ICD-10-CM

## 2015-03-15 DIAGNOSIS — M810 Age-related osteoporosis without current pathological fracture: Secondary | ICD-10-CM | POA: Diagnosis not present

## 2015-03-15 DIAGNOSIS — R7989 Other specified abnormal findings of blood chemistry: Secondary | ICD-10-CM | POA: Diagnosis not present

## 2015-03-15 LAB — LIPID PANEL
CHOL/HDL RATIO: 2
Cholesterol: 98 mg/dL (ref 0–200)
HDL: 45.8 mg/dL (ref >39.00)
LDL CALC: 40 mg/dL (ref 0–99)
NONHDL: 52.46
Triglycerides: 62 mg/dL (ref 0.0–149.0)
VLDL: 12.4 mg/dL (ref 0.0–40.0)

## 2015-03-15 LAB — HEPATIC FUNCTION PANEL
ALBUMIN: 4.6 g/dL (ref 3.5–5.2)
ALT: 52 U/L — AB (ref 0–35)
AST: 46 U/L — ABNORMAL HIGH (ref 0–37)
Alkaline Phosphatase: 72 U/L (ref 39–117)
BILIRUBIN DIRECT: 0.2 mg/dL (ref 0.0–0.3)
TOTAL PROTEIN: 7.4 g/dL (ref 6.0–8.3)
Total Bilirubin: 0.8 mg/dL (ref 0.2–1.2)

## 2015-03-15 LAB — BASIC METABOLIC PANEL
BUN: 15 mg/dL (ref 6–23)
CALCIUM: 10.1 mg/dL (ref 8.4–10.5)
CHLORIDE: 101 meq/L (ref 96–112)
CO2: 34 mEq/L — ABNORMAL HIGH (ref 19–32)
CREATININE: 0.82 mg/dL (ref 0.40–1.20)
GFR: 73.2 mL/min (ref >60.00)
GLUCOSE: 85 mg/dL (ref 70–99)
Potassium: 4.4 mEq/L (ref 3.5–5.1)
Sodium: 139 mEq/L (ref 135–145)

## 2015-03-15 MED ORDER — PANTOPRAZOLE SODIUM 40 MG PO TBEC: 40.0000 mg | DELAYED_RELEASE_TABLET | Freq: Every day | ORAL | Status: DC

## 2015-03-15 MED ORDER — ATORVASTATIN CALCIUM 40 MG PO TABS: 40.0000 mg | ORAL_TABLET | Freq: Every day | ORAL | Status: DC

## 2015-03-15 MED ORDER — RALOXIFENE HCL 60 MG PO TABS: 60.0000 mg | ORAL_TABLET | Freq: Every day | ORAL | Status: DC

## 2015-03-15 NOTE — Assessment & Plan Note (Signed)
Pt brought copy of DEXA for review.  Pt is willing to take Evista rather than bisphosphonate (had bad side effects).  Script provided.

## 2015-03-15 NOTE — Progress Notes (Signed)
Pre visit review using our clinic review tool, if applicable. No additional management support is needed unless otherwise documented below in the visit note. 

## 2015-03-15 NOTE — Assessment & Plan Note (Signed)
Chronic problem, tolerating cholesterol med w/o difficulty.  Check labs.  Adjust meds prn

## 2015-03-15 NOTE — Patient Instructions (Signed)
Schedule your complete physical in 6 months We'll notify you of your lab results and make any changes if needed Keep up the good work on healthy diet and regular exercise- you look great! Start the Evista daily for the osteoporosis Call with any questions or concerns If you want to join [place] at the new [place] office, any scheduled appointments will automatically transfer and we will see you at [address] [place] [address], [place], [place] [address] ([place]) Happy New Year!!!

## 2015-03-15 NOTE — Progress Notes (Signed)
   Subjective:    Patient ID: Denise Ewing, female    DOB: 1946-01-22, 70 y.o.   MRN: [id number]  HPI Hyperlipidemia- chronic problem, on Lipitor, Zetia.  Denies CP, SOB, HAs, visual changes, edema.  Osteoporosis- chronic problem for pt, had DEXA 01/16/15 w/ Dr Lynnette Caffey.  Pt had horrible side effects w/ Fosamax.  She tried samples of Evista and did ok w/ this.  Pt is asking for me to prescribe Evista.   Review of Systems For ROS see HPI     Objective:   Physical Exam  Constitutional: She is oriented to person, place, and time. She appears well-developed and well-nourished. No distress.  HENT:  Head: Normocephalic and atraumatic.  Eyes: Conjunctivae and EOM are normal. Pupils are equal, round, and reactive to light.  Neck: Normal range of motion. Neck supple. No thyromegaly present.  Cardiovascular: Normal rate, regular rhythm, normal heart sounds and intact distal pulses.   No murmur heard. Pulmonary/Chest: Effort normal and breath sounds normal. No respiratory distress.  Abdominal: Soft. She exhibits no distension. There is no tenderness.  Musculoskeletal: She exhibits no edema.  Lymphadenopathy:    She has no cervical adenopathy.  Neurological: She is alert and oriented to person, place, and time.  Skin: Skin is warm and dry.  Psychiatric: She has a normal mood and affect. Her behavior is normal.  Vitals reviewed.         Assessment & Plan:

## 2015-03-16 NOTE — Addendum Note (Signed)
Addended by: Dorrene German on: 03/16/2015 04:38 PM   Modules accepted: Orders

## 2015-03-20 ENCOUNTER — Encounter: Payer: Self-pay | Admitting: General Practice

## 2015-03-28 ENCOUNTER — Encounter: Payer: Self-pay | Admitting: Family Medicine

## 2015-03-29 ENCOUNTER — Other Ambulatory Visit (INDEPENDENT_AMBULATORY_CARE_PROVIDER_SITE_OTHER): Payer: Medicare Other

## 2015-03-29 DIAGNOSIS — R7989 Other specified abnormal findings of blood chemistry: Secondary | ICD-10-CM

## 2015-03-29 DIAGNOSIS — R945 Abnormal results of liver function studies: Principal | ICD-10-CM

## 2015-03-29 LAB — HEPATIC FUNCTION PANEL
ALT: 40 U/L — ABNORMAL HIGH (ref 0–35)
AST: 37 U/L (ref 0–37)
Albumin: 4.5 g/dL (ref 3.5–5.2)
Alkaline Phosphatase: 67 U/L (ref 39–117)
BILIRUBIN DIRECT: 0.2 mg/dL (ref 0.0–0.3)
BILIRUBIN TOTAL: 0.8 mg/dL (ref 0.2–1.2)
Total Protein: 7.2 g/dL (ref 6.0–8.3)

## 2015-04-10 DIAGNOSIS — H401113 Primary open-angle glaucoma, right eye, severe stage: Secondary | ICD-10-CM | POA: Diagnosis not present

## 2015-04-10 DIAGNOSIS — H04123 Dry eye syndrome of bilateral lacrimal glands: Secondary | ICD-10-CM | POA: Diagnosis not present

## 2015-04-10 DIAGNOSIS — H401123 Primary open-angle glaucoma, left eye, severe stage: Secondary | ICD-10-CM | POA: Diagnosis not present

## 2015-04-10 DIAGNOSIS — H2513 Age-related nuclear cataract, bilateral: Secondary | ICD-10-CM | POA: Diagnosis not present

## 2015-04-19 ENCOUNTER — Encounter: Payer: Self-pay | Admitting: Internal Medicine

## 2015-04-19 ENCOUNTER — Ambulatory Visit (INDEPENDENT_AMBULATORY_CARE_PROVIDER_SITE_OTHER): Payer: Medicare Other | Admitting: Internal Medicine

## 2015-04-19 VITALS — BP 124/62 | HR 57 | Ht 62.0 in | Wt 147.0 lb

## 2015-04-19 DIAGNOSIS — E785 Hyperlipidemia, unspecified: Secondary | ICD-10-CM | POA: Diagnosis not present

## 2015-04-19 MED ORDER — ATORVASTATIN CALCIUM 40 MG PO TABS: 20.0000 mg | ORAL_TABLET | Freq: Every day | ORAL | Status: DC

## 2015-04-19 NOTE — Patient Instructions (Addendum)
Your physician has recommended you make the following change in your medication:  1.) DECREASE LIPITOR TO 20 MG DAILY  Your physician wants you to follow-up in: [place].  You will receive a reminder letter in the mail two months in advance. If you don't receive a letter, please call our office to schedule the follow-up appointment.  Your physician recommends that you return for lab work in: 3 MONTHS (LIPIDS)

## 2015-04-19 NOTE — Progress Notes (Signed)
Cardiology Office Note   Date:  04/19/2015   ID:  Denise Ewing, DOB [birth year], MRN [id number]  PCP:  Annye Asa, MD  Cardiologist:   Dorris Carnes, MD   F/U of HL and CAD    History of Present Illness: Denise Ewing is a 70 y.o. female with a history of CAD by CT angiogram (50% LAD lesion), atyypical CP , HL  I saw her in May  2016  Lipd panel in Jan LDL was 40    Since seen she has done well NO CP  No SOB    Current Outpatient Prescriptions  Medication Sig Dispense Refill  . aspirin 81 MG tablet Take 81 mg by mouth daily.    [name] atorvastatin (LIPITOR) 40 MG tablet Take 1 tablet (40 mg total) by mouth daily. 30 tablet 6  . Calcium Citrate-Vitamin D (CITRACAL/VITAMIN D) 250-200 MG-UNIT TABS Take 2 tablets by mouth at bedtime.     . Coenzyme Q10 (CO Q-10 PO) Take 1 capsule by mouth every morning.    . conjugated estrogens (PREMARIN) vaginal cream Place 1.5 g vaginally every Friday.     . ezetimibe (ZETIA) 10 MG tablet Take 0.5 tablets (5 mg total) by mouth daily. 30 tablet 6  . GLUCOSAMINE CHONDROITIN COMPLX PO Take 2 tablets by mouth every morning.     . latanoprost (XALATAN) 0.005 % ophthalmic solution Place 1 drop into both eyes at bedtime.   0  . Magnesium 200 MG TABS Take 200 mg by mouth daily.     . Multiple Vitamins-Minerals (ALIVE WOMENS 50+ PO) Take 1 tablet by mouth every morning.    [name] OVER THE COUNTER MEDICATION Take 1 tablet by mouth 2 (two) times daily. Tumeric and cumin    . pantoprazole (PROTONIX) 40 MG tablet Take 1 tablet (40 mg total) by mouth daily. 30 tablet 6  . raloxifene (EVISTA) 60 MG tablet Take 1 tablet (60 mg total) by mouth daily. 30 tablet 11  . timolol (BETIMOL) 0.5 % ophthalmic solution Place 1 drop into the left eye 2 (two) times daily.     No current facility-administered medications for this visit.    Allergies:   Amoxicillin   Past Medical History  Diagnosis Date  . Melanoma of thigh ([facility])   . GERD (gastroesophageal reflux disease)   .  Glaucoma   . History of colonic polyps     Past Surgical History  Procedure Laterality Date  . Melanoma removed      R thigh  . Colon polyps removed    . Total abdominal hysterectomy w/ bilateral salpingoophorectomy      uterine fibroids     Social History:  The patient  reports that she has never smoked. She has never used smokeless tobacco. She reports that she drinks alcohol. She reports that she does not use illicit drugs.   Family History:  The patient's family history includes CAD in her sister; Cancer in her mother; Hypertension in her mother; Osteoporosis in her mother.    ROS:  Please see the history of present illness. All other systems are reviewed and  Negative to the above problem except as noted.    PHYSICAL EXAM: VS:  BP 124/62 mmHg  Pulse 57  Ht 5\' 2"  (1.575 m)  Wt 66.679 kg (147 lb)  BMI 26.88 kg/m2  GEN: Well nourished, well developed, in no acute distress HEENT: normal Neck: no JVD, carotid bruits, or masses Cardiac: RRR; no murmurs, rubs, or gallops,no edema  Respiratory:  clear to auscultation bilaterally, normal work of breathing GI: soft, nontender, nondistended, + BS  No hepatomegaly  MS: no deformity Moving all extremities   Skin: warm and dry, no rash Neuro:  Strength and sensation are intact Psych: euthymic mood, full affect   EKG:  EKG is ordered today.  SB 57 bpm     Lipid Panel    Component Value Date/Time   CHOL 98 03/15/2015 1128   CHOL 96* 12/21/2014 0824   TRIG 62.0 03/15/2015 1128   TRIG 66 12/21/2014 0824   HDL 45.80 03/15/2015 1128   HDL 45* 12/21/2014 0824   CHOLHDL 2 03/15/2015 1128   CHOLHDL 2.1 12/21/2014 0824   VLDL 12.4 03/15/2015 1128   LDLCALC 40 03/15/2015 1128   LDLCALC 38 12/21/2014 0824      Wt Readings from Last 3 Encounters:  04/19/15 66.679 kg (147 lb)  03/15/15 66.951 kg (147 lb 9.6 oz)  06/30/14 66.316 kg (146 lb 3.2 oz)      ASSESSMENT AND PLAN:  1.  CAD  Mild at CT  No symptoms to suggest  angina  Keep on same regimen  2.  HL  Lipids are EXCELLETN  Zetia made a difference  Would cut back on lipitor to 20  F/U lipids in 3 months    Stay active  F/U in 1 year    Current medicines are reviewed at length with the patient today.  The patient does not have concerns regarding medicines.  Signed, Dorris Carnes, MD  04/19/2015 10:30 AM    [facility] [place], [place], [place]  [address] Phone: [phone]; Fax: [phone]

## 2015-04-23 NOTE — Addendum Note (Signed)
Addended by: Freada Bergeron on: 04/23/2015 04:30 PM   Modules accepted: Orders

## 2015-06-19 DIAGNOSIS — H401123 Primary open-angle glaucoma, left eye, severe stage: Secondary | ICD-10-CM | POA: Diagnosis not present

## 2015-06-19 DIAGNOSIS — H401111 Primary open-angle glaucoma, right eye, mild stage: Secondary | ICD-10-CM | POA: Diagnosis not present

## 2015-07-17 ENCOUNTER — Other Ambulatory Visit (INDEPENDENT_AMBULATORY_CARE_PROVIDER_SITE_OTHER): Payer: Medicare Other | Admitting: *Deleted

## 2015-07-17 DIAGNOSIS — E785 Hyperlipidemia, unspecified: Secondary | ICD-10-CM

## 2015-07-17 LAB — LIPID PANEL
CHOLESTEROL: 95 mg/dL — AB (ref 125–200)
HDL: 54 mg/dL (ref >=46)
LDL Cholesterol: 32 mg/dL (ref <130)
TRIGLYCERIDES: 47 mg/dL (ref <150)
Total CHOL/HDL Ratio: 1.8 Ratio (ref <=5.0)
VLDL: 9 mg/dL (ref <30)

## 2015-07-25 ENCOUNTER — Other Ambulatory Visit: Payer: Self-pay | Admitting: *Deleted

## 2015-07-25 DIAGNOSIS — E785 Hyperlipidemia, unspecified: Secondary | ICD-10-CM

## 2015-07-25 MED ORDER — ATORVASTATIN CALCIUM 10 MG PO TABS: 10.0000 mg | ORAL_TABLET | Freq: Every day | ORAL | Status: DC

## 2015-08-02 ENCOUNTER — Encounter: Payer: Self-pay | Admitting: Family Medicine

## 2015-08-02 ENCOUNTER — Ambulatory Visit (INDEPENDENT_AMBULATORY_CARE_PROVIDER_SITE_OTHER): Payer: Medicare Other | Admitting: Family Medicine

## 2015-08-02 VITALS — BP 121/68 | HR 58 | Temp 98.0°F | Resp 16 | Ht 62.0 in | Wt 146.5 lb

## 2015-08-02 DIAGNOSIS — M810 Age-related osteoporosis without current pathological fracture: Secondary | ICD-10-CM | POA: Diagnosis not present

## 2015-08-02 DIAGNOSIS — Z0000 Encounter for general adult medical examination without abnormal findings: Secondary | ICD-10-CM

## 2015-08-02 DIAGNOSIS — E785 Hyperlipidemia, unspecified: Secondary | ICD-10-CM | POA: Diagnosis not present

## 2015-08-02 LAB — CBC WITH DIFFERENTIAL/PLATELET
BASOS PCT: 0.4 % (ref 0.0–3.0)
Basophils Absolute: 0 10*3/uL (ref 0.0–0.1)
Eosinophils Absolute: 0.2 10*3/uL (ref 0.0–0.7)
Eosinophils Relative: 3.8 % (ref 0.0–5.0)
HEMATOCRIT: 39 % (ref 36.0–46.0)
HEMOGLOBIN: 13 g/dL (ref 12.0–15.0)
LYMPHS PCT: 35.6 % (ref 12.0–46.0)
Lymphs Abs: 1.9 10*3/uL (ref 0.7–4.0)
MCHC: 33.4 g/dL (ref 30.0–36.0)
MCV: 92.5 fl (ref 78.0–100.0)
MONOS PCT: 8.4 % (ref 3.0–12.0)
Monocytes Absolute: 0.5 10*3/uL (ref 0.1–1.0)
NEUTROS ABS: 2.8 10*3/uL (ref 1.4–7.7)
Neutrophils Relative %: 51.8 % (ref 43.0–77.0)
Platelets: 230 10*3/uL (ref 150.0–400.0)
RBC: 4.22 Mil/uL (ref 3.87–5.11)
RDW: 13.3 % (ref 11.5–15.5)
WBC: 5.5 10*3/uL (ref 4.0–10.5)

## 2015-08-02 LAB — BASIC METABOLIC PANEL
BUN: 15 mg/dL (ref 6–23)
CALCIUM: 9.7 mg/dL (ref 8.4–10.5)
CO2: 31 mEq/L (ref 19–32)
Chloride: 104 mEq/L (ref 96–112)
Creatinine, Ser: 0.78 mg/dL (ref 0.40–1.20)
GFR: 77.46 mL/min (ref >60.00)
Glucose, Bld: 98 mg/dL (ref 70–99)
Potassium: 4.7 mEq/L (ref 3.5–5.1)
Sodium: 139 mEq/L (ref 135–145)

## 2015-08-02 LAB — VITAMIN D 25 HYDROXY (VIT D DEFICIENCY, FRACTURES): VITD: 60.64 ng/mL (ref 30.00–100.00)

## 2015-08-02 LAB — HEPATIC FUNCTION PANEL
ALT: 23 U/L (ref 0–35)
AST: 28 U/L (ref 0–37)
Albumin: 4.6 g/dL (ref 3.5–5.2)
Alkaline Phosphatase: 53 U/L (ref 39–117)
BILIRUBIN DIRECT: 0.2 mg/dL (ref 0.0–0.3)
BILIRUBIN TOTAL: 0.6 mg/dL (ref 0.2–1.2)
TOTAL PROTEIN: 6.7 g/dL (ref 6.0–8.3)

## 2015-08-02 LAB — TSH: TSH: 2 u[IU]/mL (ref 0.35–4.50)

## 2015-08-02 NOTE — Progress Notes (Signed)
Pre visit review using our clinic review tool, if applicable. No additional management support is needed unless otherwise documented below in the visit note. 

## 2015-08-02 NOTE — Progress Notes (Signed)
   Subjective:    Patient ID: Denise Ewing, female    DOB: 1946/02/12, 70 y.o.   MRN: [id number]  HPI Here today for CPE.  Risk Factors: Hyperlipidemia- chronic problem, on lipitor 10 mg and Zetia 5 mg daily. Osteoporosis- chronic problem, on Evista (started by GYN).  UTD on DEXA.  Due for Vit D level Physical Activity: exercising regularly Fall Risk: low risk Depression: denies  Hearing: normal to conversational tones and whispered voice at 6 ft ADL's: independent Cognitive: normal linear thought process, memory and attention intact Home Safety: safe at home, lives w/ husband Height, Weight, BMI, Visual Acuity: see vitals, vision corrected to 20/20 w/ glasses Counseling: UTD on colonoscopy- not due until 2019.  UTD on mammogram, DEXA.  UTD on immunizations Care team reviewed and updated w/ pt Labs Ordered: See A&P Care Plan: See A&P    Review of Systems Patient reports no vision/ hearing changes, adenopathy,fever, weight change,  persistant/recurrent hoarseness , swallowing issues, chest pain, palpitations, edema, persistant/recurrent cough, hemoptysis, dyspnea (rest/exertional/paroxysmal nocturnal), gastrointestinal bleeding (melena, rectal bleeding), abdominal pain, significant heartburn, bowel changes, GU symptoms (dysuria, hematuria, incontinence), Gyn symptoms (abnormal  bleeding, pain),  syncope, focal weakness, memory loss, numbness & tingling, skin/hair/nail changes, abnormal bruising or bleeding, anxiety, or depression.     Objective:   Physical Exam General Appearance:    Alert, cooperative, no distress, appears stated age  Head:    Normocephalic, without obvious abnormality, atraumatic  Eyes:    PERRL, conjunctiva/corneas clear, EOM's intact, fundi    benign, both eyes  Ears:    Normal TM's and external ear canals, both ears  Nose:   Nares normal, septum midline, mucosa normal, no drainage    or sinus tenderness  Throat:   Lips, mucosa, and tongue normal; teeth and gums  normal  Neck:   Supple, symmetrical, trachea midline, no adenopathy;    Thyroid: no enlargement/tenderness/nodules  Back:     Symmetric, no curvature, ROM normal, no CVA tenderness  Lungs:     Clear to auscultation bilaterally, respirations unlabored  Chest Wall:    No tenderness or deformity   Heart:    Regular rate and rhythm, S1 and S2 normal, no murmur, rub   or gallop  Breast Exam:    Deferred to GYN  Abdomen:     Soft, non-tender, bowel sounds active all four quadrants,    no masses, no organomegaly  Genitalia:    Deferred to GYN  Rectal:    Extremities:   Extremities normal, atraumatic, no cyanosis or edema  Pulses:   2+ and symmetric all extremities  Skin:   Skin color, texture, turgor normal, no rashes or lesions  Lymph nodes:   Cervical, supraclavicular, and axillary nodes normal  Neurologic:   CNII-XII intact, normal strength, sensation and reflexes    throughout          Assessment & Plan:

## 2015-08-02 NOTE — Assessment & Plan Note (Signed)
Pt's PE WNL.  UTD on colonoscopy, mammo, DEXA.  Written screening schedule updated and given to pt.  Check labs.  Anticipatory guidance provided.

## 2015-08-02 NOTE — Assessment & Plan Note (Signed)
Chronic problem.  On Lipitor and Zetia.  Reviewed recent labs by cards.  No need to repeat today.  Will follow.

## 2015-08-02 NOTE — Assessment & Plan Note (Signed)
Chronic problem.  On Evista.  Check Vit D.  Due for DEXA next year.

## 2015-08-02 NOTE — Patient Instructions (Signed)
Follow up in 1 year or as needed We'll notify you of your lab results and make any changes if needed Keep up the good work!  You look great!!! You are up to date on colonoscopy until 2019, mammogram until November, Bone density until 2018 Call with any questions or concerns Thanks for sticking with [place]! Have a great summer!!!

## 2015-08-21 ENCOUNTER — Other Ambulatory Visit: Payer: Self-pay | Admitting: Internal Medicine

## 2015-09-13 ENCOUNTER — Ambulatory Visit: Payer: Medicare Other | Admitting: Family Medicine

## 2015-10-15 ENCOUNTER — Ambulatory Visit (INDEPENDENT_AMBULATORY_CARE_PROVIDER_SITE_OTHER): Payer: Medicare Other | Admitting: Emergency Medicine

## 2015-10-15 DIAGNOSIS — Z23 Encounter for immunization: Secondary | ICD-10-CM

## 2015-10-23 DIAGNOSIS — H401111 Primary open-angle glaucoma, right eye, mild stage: Secondary | ICD-10-CM | POA: Diagnosis not present

## 2015-10-23 DIAGNOSIS — H401123 Primary open-angle glaucoma, left eye, severe stage: Secondary | ICD-10-CM | POA: Diagnosis not present

## 2015-11-07 ENCOUNTER — Other Ambulatory Visit: Payer: Self-pay | Admitting: Family Medicine

## 2015-12-26 DIAGNOSIS — L570 Actinic keratosis: Secondary | ICD-10-CM | POA: Diagnosis not present

## 2015-12-26 DIAGNOSIS — R21 Rash and other nonspecific skin eruption: Secondary | ICD-10-CM | POA: Diagnosis not present

## 2015-12-31 ENCOUNTER — Ambulatory Visit (INDEPENDENT_AMBULATORY_CARE_PROVIDER_SITE_OTHER): Payer: Medicare Other | Admitting: Family Medicine

## 2015-12-31 ENCOUNTER — Encounter: Payer: Self-pay | Admitting: Family Medicine

## 2015-12-31 VITALS — BP 119/68 | HR 59 | Temp 98.2°F | Resp 17 | Ht 62.0 in | Wt 150.1 lb

## 2015-12-31 DIAGNOSIS — M79642 Pain in left hand: Secondary | ICD-10-CM

## 2015-12-31 DIAGNOSIS — M79641 Pain in right hand: Secondary | ICD-10-CM

## 2015-12-31 MED ORDER — MELOXICAM 15 MG PO TABS: 15.0000 mg | ORAL_TABLET | Freq: Every day | ORAL | 1 refills | Status: DC

## 2015-12-31 NOTE — Patient Instructions (Signed)
Follow up as needed/scheduled We'll notify you of your lab results and make any changes if needed Start the Meloxicam once daily w/ food for 10-14 days and then as needed ICE for pain relief We'll call you with your hand specialist appt Call with any questions or concerns Hang in there! Happy Holidays!!!

## 2015-12-31 NOTE — Progress Notes (Signed)
   Subjective:    Patient ID: Denise Ewing, female    DOB: 1945/06/26, 70 y.o.   MRN: [id number]  HPI Hand pain- sxs started ~1 month ago to the point that all fingers are hurting.  No relief w/ ibuprofen, [place] Oil.  Difficulty opening cans, bottles.  Pain described as an aching unless there is resistance, such as with typing, and then pain is sharp.  Starting to have hand deformity- as her mother did.  Pain is bilateral.  R pink is most painful.  L thumb and middle finger are most painful.  No other joint pain.   Review of Systems For ROS see HPI     Objective:   Physical Exam  Constitutional: She is oriented to person, place, and time. She appears well-developed and well-nourished. No distress.  Cardiovascular: Intact distal pulses.   Musculoskeletal: She exhibits edema, tenderness (TTP over R 5th finger, L CMC joint) and deformity (pt w/ nodularity over multiple DIP and PIP joints, some ulnar deviation and some radial deviation).  Decreased ability to grip, turn w/ either hand  Neurological: She is alert and oriented to person, place, and time. No cranial nerve deficit. Coordination normal.  Skin: Skin is warm and dry. No erythema.  Vitals reviewed.         Assessment & Plan:  Bilateral hand pain- new.  Pt having pain and deformity of knuckles on both hands.  Check labs to assess for possible rheumatoid arthritis.  Start daily NSAID.  Refer to hand specialist for formal evaluation.  Reviewed supportive care and red flags that should prompt return.  Pt expressed understanding and is in agreement w/ plan.

## 2015-12-31 NOTE — Progress Notes (Signed)
Pre visit review using our clinic review tool, if applicable. No additional management support is needed unless otherwise documented below in the visit note. 

## 2016-01-01 ENCOUNTER — Ambulatory Visit: Payer: Medicare Other | Admitting: Family Medicine

## 2016-01-01 LAB — RHEUMATOID FACTOR

## 2016-01-01 LAB — ANTI-NUCLEAR AB-TITER (ANA TITER): ANA Titer 1: 1:40 {titer} — ABNORMAL HIGH

## 2016-01-01 LAB — SEDIMENTATION RATE: Sed Rate: 1 mm/hr (ref 0–30)

## 2016-01-01 LAB — ANA: ANA: POSITIVE — AB

## 2016-01-02 ENCOUNTER — Other Ambulatory Visit: Payer: Self-pay | Admitting: Family Medicine

## 2016-01-02 DIAGNOSIS — R768 Other specified abnormal immunological findings in serum: Secondary | ICD-10-CM

## 2016-01-21 DIAGNOSIS — M19041 Primary osteoarthritis, right hand: Secondary | ICD-10-CM | POA: Diagnosis not present

## 2016-01-21 DIAGNOSIS — M19042 Primary osteoarthritis, left hand: Secondary | ICD-10-CM | POA: Diagnosis not present

## 2016-01-21 DIAGNOSIS — Z1231 Encounter for screening mammogram for malignant neoplasm of breast: Secondary | ICD-10-CM | POA: Diagnosis not present

## 2016-01-21 LAB — HM MAMMOGRAPHY

## 2016-01-22 ENCOUNTER — Encounter: Payer: Self-pay | Admitting: General Practice

## 2016-01-22 DIAGNOSIS — R1031 Right lower quadrant pain: Secondary | ICD-10-CM | POA: Diagnosis not present

## 2016-01-22 DIAGNOSIS — Z6827 Body mass index (BMI) 27.0-27.9, adult: Secondary | ICD-10-CM | POA: Diagnosis not present

## 2016-01-22 DIAGNOSIS — Z01419 Encounter for gynecological examination (general) (routine) without abnormal findings: Secondary | ICD-10-CM | POA: Diagnosis not present

## 2016-02-05 DIAGNOSIS — B372 Candidiasis of skin and nail: Secondary | ICD-10-CM | POA: Diagnosis not present

## 2016-02-14 DIAGNOSIS — M2550 Pain in unspecified joint: Secondary | ICD-10-CM | POA: Diagnosis not present

## 2016-02-14 DIAGNOSIS — E663 Overweight: Secondary | ICD-10-CM | POA: Diagnosis not present

## 2016-02-14 DIAGNOSIS — R5383 Other fatigue: Secondary | ICD-10-CM | POA: Diagnosis not present

## 2016-02-14 DIAGNOSIS — Z6827 Body mass index (BMI) 27.0-27.9, adult: Secondary | ICD-10-CM | POA: Diagnosis not present

## 2016-02-14 DIAGNOSIS — M150 Primary generalized (osteo)arthritis: Secondary | ICD-10-CM | POA: Diagnosis not present

## 2016-03-04 DIAGNOSIS — H401123 Primary open-angle glaucoma, left eye, severe stage: Secondary | ICD-10-CM | POA: Diagnosis not present

## 2016-03-04 DIAGNOSIS — H2513 Age-related nuclear cataract, bilateral: Secondary | ICD-10-CM | POA: Diagnosis not present

## 2016-03-04 DIAGNOSIS — H401111 Primary open-angle glaucoma, right eye, mild stage: Secondary | ICD-10-CM | POA: Diagnosis not present

## 2016-03-04 DIAGNOSIS — H04123 Dry eye syndrome of bilateral lacrimal glands: Secondary | ICD-10-CM | POA: Diagnosis not present

## 2016-03-11 DIAGNOSIS — Z85820 Personal history of malignant melanoma of skin: Secondary | ICD-10-CM | POA: Diagnosis not present

## 2016-03-11 DIAGNOSIS — Z08 Encounter for follow-up examination after completed treatment for malignant neoplasm: Secondary | ICD-10-CM | POA: Diagnosis not present

## 2016-03-17 ENCOUNTER — Other Ambulatory Visit: Payer: Self-pay | Admitting: Family Medicine

## 2016-03-26 ENCOUNTER — Other Ambulatory Visit: Payer: Self-pay | Admitting: Internal Medicine

## 2016-03-28 ENCOUNTER — Other Ambulatory Visit: Payer: Self-pay | Admitting: *Deleted

## 2016-03-28 MED ORDER — EZETIMIBE 10 MG PO TABS: 5.0000 mg | ORAL_TABLET | Freq: Every day | ORAL | 0 refills | Status: DC

## 2016-04-08 NOTE — Progress Notes (Signed)
Cardiology Office Note   Date:  04/10/2016   ID:  Denise Ewing, DOB 02-11-1946, MRN [id number]  PCP:  Annye Asa, MD  Cardiologist:   Dorris Carnes, MD   F?U of HL and CAD      History of Present Illness: Denise Ewing is a 71 y.o. female with a history of CAD by CT angiogram (50% LAD), atypical CP, HL  I saw her in Feb 2017    Since seen she says she has felt good from a cardiac standpoint  No CP  Breathing is good.       Current Meds  Medication Sig  . aspirin 81 MG tablet Take 81 mg by mouth daily.  [name] atorvastatin (LIPITOR) 10 MG tablet Take 1 tablet (10 mg total) by mouth daily.  . Calcium Citrate-Vitamin D (CITRACAL/VITAMIN D) 250-200 MG-UNIT TABS Take 2 tablets by mouth at bedtime.   . Coenzyme Q10 (CO Q-10 PO) Take 1 capsule by mouth every morning.  . conjugated estrogens (PREMARIN) vaginal cream Place 1.5 g vaginally every Friday.   . ezetimibe (ZETIA) 10 MG tablet Take 0.5 tablets (5 mg total) by mouth daily.  [name] GLUCOSAMINE CHONDROITIN COMPLX PO Take 2 tablets by mouth every morning.   . meloxicam (MOBIC) 15 MG tablet Take 1 tablet (15 mg total) by mouth daily.  . Multiple Vitamins-Minerals (ALIVE WOMENS 50+ PO) Take 1 tablet by mouth every morning.  [name] OVER THE COUNTER MEDICATION Take 1 tablet by mouth 2 (two) times daily. Tumeric and cumin  . pantoprazole (PROTONIX) 40 MG tablet TAKE ONE TABLET BY MOUTH ONCE DAILY  . raloxifene (EVISTA) 60 MG tablet TAKE ONE TABLET BY MOUTH ONCE DAILY  . timolol (BETIMOL) 0.5 % ophthalmic solution Place 1 drop into the left eye 2 (two) times daily.  . TRAVATAN Z 0.004 % SOLN ophthalmic solution      Allergies:   Amoxicillin   Past Medical History:  Diagnosis Date  . GERD (gastroesophageal reflux disease)   . Glaucoma   . History of colonic polyps   . Melanoma of thigh [facility])     Past Surgical History:  Procedure Laterality Date  . Colon polyps removed    . Melanoma removed     R thigh  . TOTAL ABDOMINAL HYSTERECTOMY  W/ BILATERAL SALPINGOOPHORECTOMY     uterine fibroids     Social History:  The patient  reports that she has never smoked. She has never used smokeless tobacco. She reports that she drinks alcohol. She reports that she does not use drugs.   Family History:  The patient's family history includes CAD in her sister; Cancer in her mother; Hypertension in her mother; Osteoporosis in her mother.    ROS:  Please see the history of present illness. All other systems are reviewed and  Negative to the above problem except as noted.    PHYSICAL EXAM: VS:  BP 138/68   Pulse (!) 59   Ht 5\' 2"  (1.575 m)   Wt 149 lb 6.4 oz (67.8 kg)   BMI 27.33 kg/m   GEN: Well nourished, well developed, in no acute distress  HEENT: normal  Neck: no JVD, carotid bruits, or masses Cardiac: RRR; no murmurs, rubs, or gallops,no edema  Respiratory:  clear to auscultation bilaterally, normal work of breathing GI: soft, nontender, nondistended, + BS  No hepatomegaly  MS: no deformity Moving all extremities   Skin: warm and dry, no rash Neuro:  Strength and sensation are intact Psych: euthymic mood,  full affect   EKG:  EKG is ordered today.  SB 59 bpm  ST changes consistent with early repolarization.     Lipid Panel    Component Value Date/Time   CHOL 95 (L) 07/17/2015 0741   CHOL 96 (L) 12/21/2014 0824   TRIG 47 07/17/2015 0741   TRIG 66 12/21/2014 0824   HDL 54 07/17/2015 0741   HDL 45 (L) 12/21/2014 0824   CHOLHDL 1.8 07/17/2015 0741   VLDL 9 07/17/2015 0741   LDLCALC 32 07/17/2015 0741   LDLCALC 38 12/21/2014 0824      Wt Readings from Last 3 Encounters:  04/10/16 149 lb 6.4 oz (67.8 kg)  12/31/15 150 lb 2 oz (68.1 kg)  08/02/15 146 lb 8 oz (66.5 kg)      ASSESSMENT AND PLAN:  1  CAD No sympotms to sugg angina  Keep on same meds  2  HL  Check lpids today  Encouraged her to stay active.   F>U in 12 months    Current medicines are reviewed at length with the patient today.  The patient  does not have concerns regarding medicines.  Signed, Dorris Carnes, MD  04/10/2016 11:20 AM    [facility] [place], [place], [place]  [address] Phone: [phone]; Fax: [phone]

## 2016-04-10 ENCOUNTER — Ambulatory Visit (INDEPENDENT_AMBULATORY_CARE_PROVIDER_SITE_OTHER): Payer: Medicare Other | Admitting: Internal Medicine

## 2016-04-10 ENCOUNTER — Encounter: Payer: Self-pay | Admitting: Internal Medicine

## 2016-04-10 VITALS — BP 138/68 | HR 59 | Ht 62.0 in | Wt 149.4 lb

## 2016-04-10 DIAGNOSIS — E785 Hyperlipidemia, unspecified: Secondary | ICD-10-CM

## 2016-04-10 LAB — LIPID PANEL
CHOLESTEROL TOTAL: 110 mg/dL (ref 100–199)
Chol/HDL Ratio: 2 ratio units (ref 0.0–4.4)
HDL: 54 mg/dL (ref >39)
LDL Calculated: 45 mg/dL (ref 0–99)
Triglycerides: 53 mg/dL (ref 0–149)
VLDL CHOLESTEROL CAL: 11 mg/dL (ref 5–40)

## 2016-04-10 NOTE — Patient Instructions (Addendum)
Your physician recommends that you continue on your current medications as directed. Please refer to the Current Medication list given to you today.  Your physician recommends that you return for lab work in: 1 year with Dr. Harrington Challenger.   Your physician wants you to follow-up in: 1 year with Dr. Harrington Challenger.  You will receive a reminder letter in the mail two months in advance. If you don't receive a letter, please call our office to schedule the follow-up appointment.

## 2016-04-17 ENCOUNTER — Other Ambulatory Visit: Payer: Self-pay | Admitting: Family Medicine

## 2016-05-23 ENCOUNTER — Telehealth: Payer: Self-pay | Admitting: Internal Medicine

## 2016-05-23 MED ORDER — EZETIMIBE 10 MG PO TABS: 5.0000 mg | ORAL_TABLET | Freq: Every day | ORAL | 10 refills | Status: DC

## 2016-05-23 NOTE — Telephone Encounter (Signed)
Pt's Rx was sent to pt's pharmacy as requested. Confirmation received.  °

## 2016-05-23 NOTE — Telephone Encounter (Signed)
New Message    Per pharmacist wanting to know if prescription for zetia could be changed to generic version.   *STAT* If patient is at the pharmacy, call can be transferred to refill team.  1. Which medications need to be refilled? (please list name of each medication and dose if known) Generic for Zetia  2. Which pharmacy/location (including street and city if local pharmacy) is medication to be sent to? [facility] - [place], [place] - [address]  3. Do they need a 30 day or 90 day supply? 30 day supply

## 2016-06-05 ENCOUNTER — Other Ambulatory Visit: Payer: Self-pay | Admitting: Family Medicine

## 2016-07-15 DIAGNOSIS — H401123 Primary open-angle glaucoma, left eye, severe stage: Secondary | ICD-10-CM | POA: Diagnosis not present

## 2016-07-15 DIAGNOSIS — H401111 Primary open-angle glaucoma, right eye, mild stage: Secondary | ICD-10-CM | POA: Diagnosis not present

## 2016-07-17 DIAGNOSIS — M19041 Primary osteoarthritis, right hand: Secondary | ICD-10-CM | POA: Diagnosis not present

## 2016-07-17 DIAGNOSIS — M19042 Primary osteoarthritis, left hand: Secondary | ICD-10-CM | POA: Diagnosis not present

## 2016-08-05 ENCOUNTER — Encounter: Payer: Self-pay | Admitting: Family Medicine

## 2016-08-05 ENCOUNTER — Ambulatory Visit (INDEPENDENT_AMBULATORY_CARE_PROVIDER_SITE_OTHER): Payer: Medicare Other | Admitting: Family Medicine

## 2016-08-05 ENCOUNTER — Encounter: Payer: Self-pay | Admitting: Neurology

## 2016-08-05 VITALS — BP 124/68 | HR 72 | Temp 98.0°F | Resp 16 | Ht 62.0 in | Wt 148.2 lb

## 2016-08-05 DIAGNOSIS — Z0000 Encounter for general adult medical examination without abnormal findings: Secondary | ICD-10-CM

## 2016-08-05 DIAGNOSIS — Z0001 Encounter for general adult medical examination with abnormal findings: Secondary | ICD-10-CM

## 2016-08-05 DIAGNOSIS — R202 Paresthesia of skin: Secondary | ICD-10-CM | POA: Diagnosis not present

## 2016-08-05 DIAGNOSIS — M810 Age-related osteoporosis without current pathological fracture: Secondary | ICD-10-CM | POA: Diagnosis not present

## 2016-08-05 DIAGNOSIS — E785 Hyperlipidemia, unspecified: Secondary | ICD-10-CM

## 2016-08-05 DIAGNOSIS — R200 Anesthesia of skin: Secondary | ICD-10-CM | POA: Diagnosis not present

## 2016-08-05 LAB — CBC WITH DIFFERENTIAL/PLATELET
BASOS ABS: 0 10*3/uL (ref 0.0–0.1)
Basophils Relative: 0.8 % (ref 0.0–3.0)
EOS PCT: 4.5 % (ref 0.0–5.0)
Eosinophils Absolute: 0.2 10*3/uL (ref 0.0–0.7)
HEMATOCRIT: 40.3 % (ref 36.0–46.0)
HEMOGLOBIN: 13.5 g/dL (ref 12.0–15.0)
LYMPHS PCT: 36.6 % (ref 12.0–46.0)
Lymphs Abs: 2 10*3/uL (ref 0.7–4.0)
MCHC: 33.5 g/dL (ref 30.0–36.0)
MCV: 94.9 fl (ref 78.0–100.0)
MONOS PCT: 8.8 % (ref 3.0–12.0)
Monocytes Absolute: 0.5 10*3/uL (ref 0.1–1.0)
Neutro Abs: 2.7 10*3/uL (ref 1.4–7.7)
Neutrophils Relative %: 49.3 % (ref 43.0–77.0)
Platelets: 224 10*3/uL (ref 150.0–400.0)
RBC: 4.25 Mil/uL (ref 3.87–5.11)
RDW: 13 % (ref 11.5–15.5)
WBC: 5.5 10*3/uL (ref 4.0–10.5)

## 2016-08-05 LAB — BASIC METABOLIC PANEL
BUN: 16 mg/dL (ref 6–23)
CALCIUM: 9.9 mg/dL (ref 8.4–10.5)
CO2: 31 mEq/L (ref 19–32)
Chloride: 104 mEq/L (ref 96–112)
Creatinine, Ser: 0.79 mg/dL (ref 0.40–1.20)
GFR: 76.11 mL/min (ref >60.00)
Glucose, Bld: 101 mg/dL — ABNORMAL HIGH (ref 70–99)
POTASSIUM: 4.6 meq/L (ref 3.5–5.1)
Sodium: 139 mEq/L (ref 135–145)

## 2016-08-05 LAB — B12 AND FOLATE PANEL: Vitamin B-12: >1500 pg/mL — ABNORMAL HIGH (ref 211–911)

## 2016-08-05 LAB — HEPATIC FUNCTION PANEL
ALBUMIN: 4.6 g/dL (ref 3.5–5.2)
ALT: 19 U/L (ref 0–35)
AST: 24 U/L (ref 0–37)
Alkaline Phosphatase: 47 U/L (ref 39–117)
BILIRUBIN TOTAL: 0.8 mg/dL (ref 0.2–1.2)
Bilirubin, Direct: 0.2 mg/dL (ref 0.0–0.3)
Total Protein: 6.9 g/dL (ref 6.0–8.3)

## 2016-08-05 LAB — LIPID PANEL
Cholesterol: 127 mg/dL (ref 0–200)
HDL: 52.9 mg/dL (ref >39.00)
LDL CALC: 58 mg/dL (ref 0–99)
NonHDL: 73.93
TRIGLYCERIDES: 80 mg/dL (ref 0.0–149.0)
Total CHOL/HDL Ratio: 2
VLDL: 16 mg/dL (ref 0.0–40.0)

## 2016-08-05 LAB — VITAMIN D 25 HYDROXY (VIT D DEFICIENCY, FRACTURES): VITD: 48.04 ng/mL (ref 30.00–100.00)

## 2016-08-05 LAB — TSH: TSH: 2.62 u[IU]/mL (ref 0.35–4.50)

## 2016-08-05 NOTE — Progress Notes (Signed)
Pre visit review using our clinic review tool, if applicable. No additional management support is needed unless otherwise documented below in the visit note. 

## 2016-08-05 NOTE — Assessment & Plan Note (Signed)
Pt's PE WNL.  UTD on colonoscopy- due next year.  Due for mammo and DEXA this fall.  UTD on immunizations.  Check labs.  Anticipatory guidance provided.

## 2016-08-05 NOTE — Assessment & Plan Note (Signed)
Chronic problem.  Tolerating statin w/o difficulty.  Check labs.  Adjust meds prn  

## 2016-08-05 NOTE — Patient Instructions (Signed)
Follow up in 6 months to recheck cholesterol We'll notify you of your lab results and make any changes if needed Keep up the good work on healthy diet and regular exercise- you look great! Add daily Claritin or Zyrtec for the cough and post-nasal drip We'll call you with your Neurology appt for the numbness When it is time for the mammo in the fall- send me a message and I'll order the bone density You are due for colonoscopy next year Call with any questions or concerns Have a great summer!!!

## 2016-08-05 NOTE — Assessment & Plan Note (Signed)
New.  Pt is having intermittent, migratory sxs.  Check TSH, B12, folate.  Refer to neuro for evaluation and possible nerve conduction studies.  Pt expressed understanding and is in agreement w/ plan.

## 2016-08-05 NOTE — Assessment & Plan Note (Signed)
Chronic problem.  Check Vit D level and replete prn.  Due for DEXA this fall- pt to contact me prior to mammo so I can send order for DEXA to [place].

## 2016-08-05 NOTE — Progress Notes (Signed)
   Subjective:    Patient ID: Denise Ewing, female    DOB: 1945/06/07, 71 y.o.   MRN: [id number]  HPI Here today for CPE.  Risk Factors: Hyperlipidemia- chronic problem. On Zetia and Lipitor daily. Physical Activity: exercising regularly Fall Risk: low Depression: denies Hearing: normal to conversational tones, mildly decreased to whispered voice ADL's: independent Cognitive: normal linear thought process, memory and attention intact Home Safety: safe at home, lives w/ husband Height, Weight, BMI, Visual Acuity: see vitals, vision corrected to 20/20 w/ glasses Counseling: UTD on colonoscopy (due next year), UTD on mammo.  Due for DEXA this fall.  UTD on immunizations. Care team reviewed and updated w/ pt Labs Ordered: See A&P Care Plan: See A&P    Review of Systems Patient reports no vision/ hearing changes, adenopathy,fever, weight change,  persistant/recurrent hoarseness , swallowing issues, chest pain, palpitations, edema, persistant/recurrent cough, hemoptysis, dyspnea (rest/exertional/paroxysmal nocturnal), gastrointestinal bleeding (melena, rectal bleeding), abdominal pain, significant heartburn, bowel changes, GU symptoms (dysuria, hematuria, incontinence), Gyn symptoms (abnormal  bleeding, pain),  syncope, focal weakness, memory loss, skin/hair/nail changes, abnormal bruising or bleeding, anxiety, or depression.   Numbness- pt reports she has had intermittent facial sxs for the last year.  Initially thought it was due to some dental work she was having done.  In the last few weeks has had numbness of L arm- concentrated around the elbow and alternating lower leg numbness as well.  'sometimes it's both, sometimes it's one or the other'.    Objective:   Physical Exam General Appearance:    Alert, cooperative, no distress, appears stated age  Head:    Normocephalic, without obvious abnormality, atraumatic  Eyes:    PERRL, conjunctiva/corneas clear, EOM's intact, fundi    benign,  both eyes  Ears:    Normal TM's and external ear canals, both ears  Nose:   Nares normal, septum midline, mucosa normal, no drainage    or sinus tenderness  Throat:   Lips, mucosa, and tongue normal; teeth and gums normal  Neck:   Supple, symmetrical, trachea midline, no adenopathy;    Thyroid: no enlargement/tenderness/nodules  Back:     Symmetric, no curvature, ROM normal, no CVA tenderness  Lungs:     Clear to auscultation bilaterally, respirations unlabored  Chest Wall:    No tenderness or deformity   Heart:    Regular rate and rhythm, S1 and S2 normal, no murmur, rub   or gallop  Breast Exam:    Deferred to mammo  Abdomen:     Soft, non-tender, bowel sounds active all four quadrants,    no masses, no organomegaly  Genitalia:    Deferred  Rectal:    Extremities:   Extremities normal, atraumatic, no cyanosis or edema  Pulses:   2+ and symmetric all extremities  Skin:   Skin color, texture, turgor normal, no rashes or lesions  Lymph nodes:   Cervical, supraclavicular, and axillary nodes normal  Neurologic:   CNII-XII intact, normal strength, sensation and reflexes    throughout          Assessment & Plan:

## 2016-08-12 ENCOUNTER — Other Ambulatory Visit: Payer: Self-pay | Admitting: Internal Medicine

## 2016-09-01 ENCOUNTER — Other Ambulatory Visit: Payer: Self-pay | Admitting: Family Medicine

## 2016-10-07 ENCOUNTER — Encounter: Payer: Self-pay | Admitting: Family Medicine

## 2016-10-08 ENCOUNTER — Other Ambulatory Visit: Payer: Self-pay | Admitting: Family Medicine

## 2016-10-08 DIAGNOSIS — M818 Other osteoporosis without current pathological fracture: Secondary | ICD-10-CM

## 2016-10-08 DIAGNOSIS — Z780 Asymptomatic menopausal state: Secondary | ICD-10-CM

## 2016-10-17 ENCOUNTER — Encounter: Payer: Self-pay | Admitting: Family Medicine

## 2016-10-26 ENCOUNTER — Encounter (HOSPITAL_BASED_OUTPATIENT_CLINIC_OR_DEPARTMENT_OTHER): Payer: Self-pay | Admitting: Emergency Medicine

## 2016-10-26 ENCOUNTER — Emergency Department (HOSPITAL_BASED_OUTPATIENT_CLINIC_OR_DEPARTMENT_OTHER)
Admission: EM | Admit: 2016-10-26 | Discharge: 2016-10-27 | Disposition: A | Discharge status: Home / Self Care | Payer: Medicare Other | Attending: Emergency Medicine | Admitting: Emergency Medicine

## 2016-10-26 ENCOUNTER — Emergency Department (HOSPITAL_BASED_OUTPATIENT_CLINIC_OR_DEPARTMENT_OTHER): Payer: Medicare Other

## 2016-10-26 DIAGNOSIS — W0110XA Fall on same level from slipping, tripping and stumbling with subsequent striking against unspecified object, initial encounter: Secondary | ICD-10-CM | POA: Diagnosis not present

## 2016-10-26 DIAGNOSIS — S59902A Unspecified injury of left elbow, initial encounter: Secondary | ICD-10-CM | POA: Diagnosis not present

## 2016-10-26 DIAGNOSIS — S5002XA Contusion of left elbow, initial encounter: Secondary | ICD-10-CM | POA: Diagnosis not present

## 2016-10-26 DIAGNOSIS — S50312A Abrasion of left elbow, initial encounter: Secondary | ICD-10-CM | POA: Insufficient documentation

## 2016-10-26 DIAGNOSIS — Z7982 Long term (current) use of aspirin: Secondary | ICD-10-CM | POA: Diagnosis not present

## 2016-10-26 DIAGNOSIS — Y939 Activity, unspecified: Secondary | ICD-10-CM | POA: Insufficient documentation

## 2016-10-26 DIAGNOSIS — Z79899 Other long term (current) drug therapy: Secondary | ICD-10-CM | POA: Diagnosis not present

## 2016-10-26 DIAGNOSIS — Y929 Unspecified place or not applicable: Secondary | ICD-10-CM | POA: Insufficient documentation

## 2016-10-26 DIAGNOSIS — M25522 Pain in left elbow: Secondary | ICD-10-CM

## 2016-10-26 DIAGNOSIS — Z85828 Personal history of other malignant neoplasm of skin: Secondary | ICD-10-CM | POA: Insufficient documentation

## 2016-10-26 DIAGNOSIS — Y999 Unspecified external cause status: Secondary | ICD-10-CM | POA: Diagnosis not present

## 2016-10-26 DIAGNOSIS — T148XXA Other injury of unspecified body region, initial encounter: Secondary | ICD-10-CM

## 2016-10-26 DIAGNOSIS — I2510 Atherosclerotic heart disease of native coronary artery without angina pectoris: Secondary | ICD-10-CM | POA: Diagnosis not present

## 2016-10-26 MED ORDER — BACITRACIN ZINC 500 UNIT/GM EX OINT
TOPICAL_OINTMENT | Freq: Once | CUTANEOUS | Status: AC
  Administered 2016-10-26: 1 via TOPICAL
  Filled 2016-10-26: qty 28.35

## 2016-10-26 MED ORDER — ACETAMINOPHEN 325 MG PO TABS
650.0000 mg | ORAL_TABLET | Freq: Once | ORAL | Status: AC
  Administered 2016-10-26: 650 mg via ORAL
  Filled 2016-10-26: qty 2

## 2016-10-26 MED ORDER — HYDROCODONE-ACETAMINOPHEN 5-325 MG PO TABS
2.0000 | ORAL_TABLET | Freq: Once | ORAL | Status: DC
  Filled 2016-10-26: qty 2

## 2016-10-26 NOTE — ED Triage Notes (Signed)
Patient reports that she was pulled down on the concrete by a dog about 1 hour ago. The patient has a large hematoma and road rash to her Left Elbow

## 2016-10-26 NOTE — ED Provider Notes (Signed)
[facility] Provider Note   CSN: [id number] Arrival date & time: 10/26/16  1959     History   Chief Complaint Chief Complaint  Patient presents with  . Elbow Injury    HPI Denise Ewing is a 71 y.o. female who presents with left elbow That began this evening at approximately 6 PM. Patient states that she was in the neighborhood walking her dog when her dog ran away causing her to trip and fall landing on her left elbow. Patient states that she did not hit her head. She denies any LOC and is able to recall the entire event. Patient was able to get up without assistance and immediately a bleed after the event. Patient has taken Advil with minimal improvement in pain. She applied Neosporin to the abrasions. Patient reports that she came to emergency department for worsening pain. Patient reports that pain is worsened with palpation of the area. Patient is able to move the left upper extremity without any difficulty. Patient denies any neck pain, back pain, vision changes, headache, chest pain, difficulty breathing, numbness/weakness of her legs, difficulty walking.  The history is provided by the patient.    Past Medical History:  Diagnosis Date  . GERD (gastroesophageal reflux disease)   . Glaucoma   . History of colonic polyps   . Melanoma of thigh [facility])     Patient Active Problem List   Diagnosis Date Noted  . Numbness and tingling 08/05/2016  . Osteoporosis 04/18/2014  . Hyperlipidemia 04/18/2014  . CAD (coronary artery disease), native coronary artery 01/11/2014  . Chest pain 01/05/2014  . Pain in the chest   . Acute facial pain 03/23/2013  . Hand arthritis 03/23/2013  . Eczema 02/08/2013  . UTI (urinary tract infection) 07/09/2012  . Routine general medical examination at a health care facility 02/06/2012  . Lumbar radicular pain 09/05/2011  . Fatigue 12/23/2010  . Neuropathy of foot 11/04/2010  . GERD 12/05/2009  . DYSPAREUNIA 12/05/2009  . SKIN CANCER, HX  OF 12/05/2009  . COLONIC POLYPS, HX OF 12/05/2009    Past Surgical History:  Procedure Laterality Date  . Colon polyps removed    . Melanoma removed     R thigh  . TOTAL ABDOMINAL HYSTERECTOMY W/ BILATERAL SALPINGOOPHORECTOMY     uterine fibroids    OB History    No data available       Home Medications    Prior to Admission medications   Medication Sig Start Date End Date Taking? Authorizing Provider  aspirin 81 MG tablet Take 81 mg by mouth daily.    [provider]  atorvastatin (LIPITOR) 10 MG tablet TAKE ONE TABLET BY MOUTH ONCE DAILY 08/12/16   Fay Records, MD  Calcium Citrate-Vitamin D (CITRACAL/VITAMIN D) 250-200 MG-UNIT TABS Take 2 tablets by mouth at bedtime.     [provider]  Coenzyme Q10 (CO Q-10 PO) Take 1 capsule by mouth every morning.    [provider]  conjugated estrogens (PREMARIN) vaginal cream Place 1.5 g vaginally every Friday.     [provider]  diclofenac sodium (VOLTAREN) 1 % GEL  07/17/16   [provider]  ezetimibe (ZETIA) 10 MG tablet Take 0.5 tablets (5 mg total) by mouth daily. 05/23/16 08/21/16  Fay Records, MD  GLUCOSAMINE CHONDROITIN COMPLX PO Take 2 tablets by mouth every morning.     [provider]  meloxicam (MOBIC) 15 MG tablet TAKE 1 TABLET BY MOUTH ONCE DAILY 09/01/16  [name], MD  Multiple Vitamins-Minerals (ALIVE WOMENS 50+ PO) Take 1 tablet by mouth every morning.    [provider]  OVER THE COUNTER MEDICATION Take 1 tablet by mouth 2 (two) times daily. Tumeric and cumin    [provider]  pantoprazole (PROTONIX) 40 MG tablet TAKE ONE TABLET BY MOUTH ONCE DAILY 06/05/16   [name], MD  raloxifene (EVISTA) 60 MG tablet TAKE ONE TABLET BY MOUTH ONCE DAILY 03/17/16   [name], MD  timolol (BETIMOL) 0.5 % ophthalmic solution Place 1 drop into the left eye 2 (two) times daily.    [provider]  TRAVATAN Z 0.004 % SOLN  ophthalmic solution  06/22/15   [provider]    Family History Family History  Problem Relation Age of Onset  . Hypertension Mother   . Cancer Mother        breast,anal  . Osteoporosis Mother   . CAD Sister     Social History Social History  Substance Use Topics  . Smoking status: Never Smoker  . Smokeless tobacco: Never Used  . Alcohol use Yes     Comment:  occasionally     Allergies   Amoxicillin   Review of Systems Review of Systems  Constitutional: Negative for fever.  Respiratory: Negative for cough and shortness of breath.   Cardiovascular: Negative for chest pain.  Gastrointestinal: Negative for abdominal pain, nausea and vomiting.  Musculoskeletal: Positive for arthralgias and joint swelling.  Skin: Positive for color change.  Neurological: Negative for weakness, numbness and headaches.     Physical Exam Updated Vital Signs BP (!) 171/71 (BP Location: Left Arm)   Pulse 86   Temp 98.7 F (37.1 C) (Oral)   Resp 20   Ht 5\' 2"  (1.575 m)   Wt 67.1 kg (148 lb)   SpO2 100%   BMI 27.07 kg/m   Physical Exam  Constitutional: She is oriented to person, place, and time. She appears well-developed and well-nourished.  Sitting comfortably on examination table  HENT:  Head: Normocephalic and atraumatic.  Mouth/Throat: Oropharynx is clear and moist and mucous membranes are normal.  No tenderness to palpation of skull. No deformities or crepitus noted. No open wounds, abrasions or lacerations.   Eyes: Pupils are equal, round, and reactive to light. Conjunctivae, EOM and lids are normal.  Neck: Full passive range of motion without pain.  Full flexion/extension and lateral movement of neck fully intact. No bony midline tenderness. No deformities or crepitus.   Cardiovascular: Normal rate, regular rhythm, normal heart sounds and normal pulses.  Exam reveals no gallop and no friction rub.   No murmur heard. Pulmonary/Chest: Effort normal and breath sounds  normal.  No evidence of respiratory distress. Able to speak in full sentences without difficulty. No tenderness palpation to anterior chest wall.  Abdominal: Soft. Normal appearance. There is no tenderness. There is no rigidity and no guarding.  Musculoskeletal: Normal range of motion.  Tenderness palpation to the lateral aspect of the left elbow with overlying ecchymosis, soft tissue swelling. Full range of motion of left shoulder and left wrist. Flexion and extension of left elbow intact but with reports of pain. No abnormalities of the right upper extremity. No tenderness to palpation to bilateral knees and ankles. No deformities or crepitus noted. FROM of BLE without any difficulty.   Neurological: She is alert and oriented to person, place, and time. GCS eye subscore is 4. GCS verbal subscore is 5. GCS motor  subscore is 6.  Cranial nerves III-XII intact Follows commands, Moves all extremities  5/5 strength to BUE and BLE  Sensation intact throughout all major nerve distributions Normal finger to nose. No dysdiadochokinesia. No pronator drift. No slurred speech. No facial droop.   Skin: Skin is warm and dry. Capillary refill takes less than 2 seconds.  Psychiatric: She has a normal mood and affect. Her speech is normal.  Nursing note and vitals reviewed.    ED Treatments / Results  Labs (all labs ordered are listed, but only abnormal results are displayed) Labs Reviewed - No data to display  EKG  EKG Interpretation None       Radiology Dg Elbow Complete Left  Result Date: 10/26/2016 CLINICAL DATA:  Posterior left elbow pain post fall this morning EXAM: LEFT ELBOW - COMPLETE 3+ VIEW COMPARISON:  None. FINDINGS: There is no evidence of fracture, dislocation, or joint effusion. There is no evidence of arthropathy or other focal bone abnormality. There is a soft tissue hematoma subjacent to the prox ulna. IMPRESSION: No bony abnormality identified. Soft tissue hematoma of the  proximal forearm. Electronically Signed   By: Abelardo Diesel M.D.   On: 10/26/2016 20:32    Procedures Procedures (including critical care time)  Medications Ordered in ED Medications  bacitracin ointment (not administered)  acetaminophen (TYLENOL) tablet 650 mg (not administered)     Initial Impression / Assessment and Plan / ED Course  I have reviewed the triage vital signs and the nursing notes.  Pertinent labs & imaging results that were available during my care of the patient were reviewed by me and considered in my medical decision making (see chart for details).     71 year old female who presents with left elbow pain after mechanical fall that occurred approximately 6 PM this evening. No headache injury, LOC. Patient is afebrile, non-toxic appearing, sitting comfortably on examination table. Vital signs reviewed. She is hypertensive. She does not have a history of hypertension. Likely secondary to pain and anxiety regarding emergency department. Will plan to pain control and reassess. Patient with no signs of headache, chest pain, difficult breathing, numbness/weakness of her arms or legs. Do not suspect hypertensive urgency at this time. No evidence of head injury, chest injury, intra-abdominal injury. Patient with no neuro deficits on exam. Physical exam shows large hematoma to the lateral aspect of the left elbow. Consider sprain versus fracture versus dislocation versus contusion. X-rays ordered at triage.  X-rays reviewed. Negative for any acute fracture or dislocation. There is mention of a soft tissue swelling to the lateral aspect that corresponds with overlying hematoma. Discussed results with patient. Conservative therapies discussed with patient. Patient instructed follow-up with her primary care doctor next 2448 hrs. for further evaluation. Will plan to give outpatient orthopedic referral to follow-up if symptoms worsen. Strict return precautions discussed. Patient and  husband expressed understanding and agreement to plan.  Final Clinical Impressions(s) / ED Diagnoses   Final diagnoses:  Abrasion  Hematoma  Left elbow pain    New Prescriptions New Prescriptions   No medications on file     Desma Mcgregor 10/27/16 1209    Molpus, Jenny Reichmann, MD 10/28/16 [phone]

## 2016-10-26 NOTE — ED Notes (Signed)
Dr. Molpus at BS 

## 2016-10-26 NOTE — ED Notes (Signed)
EDPA into room, prior to RN assessment, see PA notes, pending orders.   

## 2016-10-27 MED ORDER — HYDROCODONE-ACETAMINOPHEN 5-325 MG PO TABS: 1.0000 | ORAL_TABLET | ORAL | 0 refills | Status: DC | PRN

## 2016-10-27 NOTE — Discharge Instructions (Signed)
You can take Tylenol or Ibuprofen as directed for pain. You can take the pain medication for severe or breakthrough pain.   Apply bacitracin or Neosporin ointment to the abrasions on the elbow.  Follow the RICE (Rest, Ice, Compression, Elevation) protocol as directed.   Follow-up with your primary care doctor next 2-4 days for further evaluation.  Eyes attention orthopedic referral for evaluation if symptoms do not improve or worsen in the next week.  Return the emergency Department for any worsening pain, numbness/weakness of the arm, redness or swelling of the arm or any other worsening or concerning symptoms.

## 2016-11-02 ENCOUNTER — Other Ambulatory Visit: Payer: Self-pay | Admitting: Family Medicine

## 2016-11-02 ENCOUNTER — Encounter: Payer: Self-pay | Admitting: Family Medicine

## 2016-11-07 ENCOUNTER — Encounter: Payer: Self-pay | Admitting: Neurology

## 2016-11-07 ENCOUNTER — Ambulatory Visit (INDEPENDENT_AMBULATORY_CARE_PROVIDER_SITE_OTHER): Payer: Medicare Other | Admitting: Neurology

## 2016-11-07 VITALS — BP 138/62 | HR 79 | Ht 62.0 in | Wt 151.0 lb

## 2016-11-07 DIAGNOSIS — R292 Abnormal reflex: Secondary | ICD-10-CM

## 2016-11-07 DIAGNOSIS — R209 Unspecified disturbances of skin sensation: Secondary | ICD-10-CM | POA: Diagnosis not present

## 2016-11-07 DIAGNOSIS — R42 Dizziness and giddiness: Secondary | ICD-10-CM | POA: Diagnosis not present

## 2016-11-07 DIAGNOSIS — R202 Paresthesia of skin: Secondary | ICD-10-CM | POA: Diagnosis not present

## 2016-11-07 NOTE — Patient Instructions (Addendum)
MRI brain with and without contrast  We have sent a referral to [facility] for your MRI and they will call you directly to schedule your appt. They are located at [place]. If you need to contact them directly please call [phone].    We will call you with the results of your testing and let you know the next step

## 2016-11-07 NOTE — Progress Notes (Signed)
[facility] Clinic Note - Initial Visit   Date: 11/07/16  Denise Ewing MRN: [id number] DOB: 18-Dec-1945   Dear Dr. Birdie Riddle:  Thank you for your kind referral of Denise Ewing for consultation of paresthesias. Although her history is well known to you, please allow [place] to reiterate it for the purpose of our medical record. The patient was accompanied to the clinic by self.    History of Present Illness: Denise Ewing is a 71 y.o. Caucasian female with hyperlipidemia, OA, glaucoma, history of melanoma of the thigh, and GERD presenting for evaluation of paresthesias.    Starting around 2017, she began having intermittent left facial numbness which she initially attributed to dental issues, as if she had novocaine. Symptoms are variable and can occur daily for several weeks and then not occur for sometime.  In May 2017, she starting having tingling of the left arm and back and side of the legs which occurs about 2-3 times per week, lasting several days. There are no identifiable triggers, alleviating or exacerbating factors. She denies any new headaches or weakness.  She has vision changes due to gluacoma, but this is stable.  Over the past month, she has spells of lightheadedness which is worse when taking a shower and intermittent dysarthria with swallowing her pills.   Of note, she suffered a mechanical fall over Labor Day weekend while taking her dog for a walk and getting her legs tangled with the leash after being startled by another dog.  She suffered a sizeable left elbow hematoma.  Out-side paper records, electronic medical record, and images have been reviewed where available and summarized as:  Lab Results  Component Value Date   TSH 2.62 08/05/2016   Lab Results  Component Value Date   VITAMINB12 >1500 (H) 08/05/2016    Past Medical History:  Diagnosis Date  . GERD (gastroesophageal reflux disease)   . Glaucoma   . History of colonic polyps   .  Melanoma of thigh [facility])     Past Surgical History:  Procedure Laterality Date  . Colon polyps removed    . Melanoma removed     R thigh  . TOTAL ABDOMINAL HYSTERECTOMY W/ BILATERAL SALPINGOOPHORECTOMY     uterine fibroids     Medications:  Outpatient Encounter Prescriptions as of 11/07/2016  Medication Sig Note  . aspirin 81 MG tablet Take 81 mg by mouth daily.   [name] atorvastatin (LIPITOR) 10 MG tablet TAKE ONE TABLET BY MOUTH ONCE DAILY   . Calcium Citrate-Vitamin D (CITRACAL/VITAMIN D) 250-200 MG-UNIT TABS Take 2 tablets by mouth at bedtime.    . Coenzyme Q10 (CO Q-10 PO) Take 1 capsule by mouth every morning.   . conjugated estrogens (PREMARIN) vaginal cream Place 1.5 g vaginally every Friday.    . diclofenac sodium (VOLTAREN) 1 % GEL    . ezetimibe (ZETIA) 10 MG tablet Take 0.5 tablets (5 mg total) by mouth daily.   [name] GLUCOSAMINE CHONDROITIN COMPLX PO Take 2 tablets by mouth every morning.    [name] HYDROcodone-acetaminophen (NORCO/VICODIN) 5-325 MG tablet Take 1-2 tablets by mouth every 4 (four) hours as needed.   . meloxicam (MOBIC) 15 MG tablet TAKE 1 TABLET BY MOUTH ONCE DAILY   . Multiple Vitamins-Minerals (ALIVE WOMENS 50+ PO) Take 1 tablet by mouth every morning.   [name] OVER THE COUNTER MEDICATION Take 1 tablet by mouth 2 (two) times daily. Tumeric and cumin   . pantoprazole (PROTONIX) 40 MG tablet TAKE ONE TABLET BY MOUTH  ONCE DAILY   . raloxifene (EVISTA) 60 MG tablet TAKE ONE TABLET BY MOUTH ONCE DAILY   . timolol (BETIMOL) 0.5 % ophthalmic solution Place 1 drop into the left eye 2 (two) times daily.   Dorette Grate Z 0.004 % SOLN ophthalmic solution  08/02/2015: Received from: External Pharmacy  . [DISCONTINUED] ezetimibe (ZETIA) 10 MG tablet     No facility-administered encounter medications on file as of 11/07/2016.      Allergies:  Allergies  Allergen Reactions  . Amoxicillin Hives    Family History: Family History  Problem Relation Age of Onset  . Hypertension Mother    . Cancer Mother        breast,anal  . Osteoporosis Mother   . CAD Sister   . Hypothyroidism Sister     Social History: Social History  Substance Use Topics  . Smoking status: Never Smoker  . Smokeless tobacco: Never Used  . Alcohol use Yes     Comment:  occasionally   Social History   Social History Narrative  . No narrative on file    Review of Systems:  CONSTITUTIONAL: No fevers, chills, night sweats, or weight loss.   EYES: No visual changes or eye pain ENT: No hearing changes.  No history of nose bleeds.   RESPIRATORY: No cough, wheezing and shortness of breath.   CARDIOVASCULAR: Negative for chest pain, and palpitations.   GI: Negative for abdominal discomfort, blood in stools or black stools.  No recent change in bowel habits.   GU:  No history of incontinence.   MUSCLOSKELETAL: No history of joint pain or swelling.  No myalgias.   SKIN: Negative for lesions, rash, and itching.   HEMATOLOGY/ONCOLOGY: Negative for prolonged bleeding, bruising easily, and swollen nodes.  +history of cancer.   ENDOCRINE: Negative for cold or heat intolerance, polydipsia or goiter.   PSYCH:  No depression or anxiety symptoms.   NEURO: As Above.   Vital Signs:  BP 138/62   Pulse 79   Ht 5\' 2"  (1.575 m)   Wt 151 lb (68.5 kg)   SpO2 98%   BMI 27.62 kg/m    General Medical Exam:   General:  Well appearing, comfortable.   Eyes/ENT: see cranial nerve examination.   Neck: No masses appreciated.  Full range of motion without tenderness.  No carotid bruits. Respiratory:  Clear to auscultation, good air entry bilaterally.   Cardiac:  Regular rate and rhythm, no murmur.   Extremities:  Left elbow with large hematoma over the lateral elbow  Neurological Exam: MENTAL STATUS including orientation to time, place, person, recent and remote memory, attention span and concentration, language, and fund of knowledge is normal.  Speech is not dysarthric.  CRANIAL NERVES: II:  No visual field  defects.  Unremarkable fundi.   III-IV-VI: Pupils equal round and reactive to light.  Normal conjugate, extra-ocular eye movements in all directions of gaze.  No nystagmus.  No ptosis.   V:  Normal facial sensation.  Jaw jerk is is absent.   VII:  Mild left facial asymmetry with smile.  Myerson's sign is present, snout and bilateral palmomental reflex is present. VIII:  Normal hearing and vestibular function.   IX-X:  Normal palatal movement.   XI:  Normal shoulder shrug and head rotation.   XII:  Normal tongue strength and range of motion, no deviation or fasciculation.  MOTOR:  Motor strength is 5/5 throughout.  No atrophy, fasciculations or abnormal movements.  No pronator drift.  Tone  is normal.    MSRs:  Right                                                                 Left brachioradialis 3+  brachioradialis 3+  biceps 3+  biceps 3+  triceps 3+  triceps 3+  patellar 3+  patellar 3+  ankle jerk 2+  ankle jerk 2+  Hoffman no  Hoffman no  plantar response down  plantar response up   SENSORY:  Normal and symmetric perception of light touch, pinprick, vibration, and proprioception.  Romberg's sign absent.   COORDINATION/GAIT: Normal finger-to- nose-finger and heel-to-shin.  Intact rapid alternating movements bilaterally.  Able to rise from a chair without using arms.  Gait narrow based and stable. Tandem and stressed gait intact.    IMPRESSION: Denise Ewing is a 71 year-old female referred for evaluation of paresthesias of the face and limbs.  Symptoms are sporadic without any precipitating event and no triggers.  Her exam shows diffusely brisk reflexes and extensor plantar response on the left with normal sensation and motor strength.  Because of her history and exam, the first step is MRI brain wwo contrast to evaluate for intracranial pathology, such as demyelinating disease.  TSH and vitamin B12 is normal.  Further recommendations will be based on the results of her imaging.  The  duration of this appointment visit was 45 minutes of face-to-face time with the patient.  Greater than 50% of this time was spent in counseling, explanation of diagnosis, planning of further management, and coordination of care.   Thank you for allowing me to participate in patient's care.  If I can answer any additional questions, I would be pleased to do so.    Sincerely,    Donika K. Posey Pronto, DO

## 2016-11-12 DIAGNOSIS — H401123 Primary open-angle glaucoma, left eye, severe stage: Secondary | ICD-10-CM | POA: Diagnosis not present

## 2016-11-12 DIAGNOSIS — H401111 Primary open-angle glaucoma, right eye, mild stage: Secondary | ICD-10-CM | POA: Diagnosis not present

## 2016-11-20 ENCOUNTER — Ambulatory Visit
Admission: RE | Admit: 2016-11-20 | Discharge: 2016-11-20 | Disposition: A | Discharge status: Home / Self Care | Payer: Medicare Other | Source: Ambulatory Visit | Attending: Neurology | Admitting: Neurology

## 2016-11-20 DIAGNOSIS — R202 Paresthesia of skin: Secondary | ICD-10-CM

## 2016-11-20 DIAGNOSIS — R209 Unspecified disturbances of skin sensation: Principal | ICD-10-CM

## 2016-11-20 MED ORDER — GADOBENATE DIMEGLUMINE 529 MG/ML IV SOLN
13.0000 mL | Freq: Once | INTRAVENOUS | Status: AC | PRN
  Administered 2016-11-20: 13 mL via INTRAVENOUS

## 2016-11-21 ENCOUNTER — Telehealth: Payer: Self-pay | Admitting: *Deleted

## 2016-11-21 NOTE — Telephone Encounter (Signed)
-----   Message from Alda Berthold, DO sent at 11/20/2016  5:14 PM EDT ----- Please inform patient that MRI brain looks great and does not explain her facial and limb numbness and tingling. Therefore I recommend doing additional testing to see if there is any nerve impingement with MRI cervical spine with and without contrast.

## 2016-11-21 NOTE — Telephone Encounter (Signed)
Attempted to contact patient. No answer and no VM.  

## 2016-11-24 ENCOUNTER — Telehealth: Payer: Self-pay | Admitting: *Deleted

## 2016-11-24 ENCOUNTER — Other Ambulatory Visit: Payer: Self-pay | Admitting: *Deleted

## 2016-11-24 DIAGNOSIS — R292 Abnormal reflex: Secondary | ICD-10-CM

## 2016-11-24 DIAGNOSIS — R209 Unspecified disturbances of skin sensation: Principal | ICD-10-CM

## 2016-11-24 DIAGNOSIS — R202 Paresthesia of skin: Secondary | ICD-10-CM

## 2016-11-24 NOTE — Telephone Encounter (Signed)
-----   Message from Alda Berthold, DO sent at 11/20/2016  5:14 PM EDT ----- Please inform patient that MRI brain looks great and does not explain her facial and limb numbness and tingling. Therefore I recommend doing additional testing to see if there is any nerve impingement with MRI cervical spine with and without contrast.

## 2016-11-24 NOTE — Telephone Encounter (Signed)
-----   Message from Denise Berthold, DO sent at 11/20/2016  5:14 PM EDT ----- Please inform patient that MRI brain looks great and does not explain her facial and limb numbness and tingling. Therefore I recommend doing additional testing to see if there is any nerve impingement with MRI cervical spine with and without contrast.

## 2016-11-24 NOTE — Telephone Encounter (Signed)
Called patient again and left message for her to call me back.

## 2016-11-24 NOTE — Telephone Encounter (Signed)
Let's see what the results of the MRI cervical spine look like first.  If we do not get answers, the next step may be spinal tap and we can check Lyme on that.

## 2016-11-24 NOTE — Telephone Encounter (Signed)
Patient given results and order for MRI placed.  She was wondering if she may need a lyme disease lab done.  Please advise.

## 2016-11-27 ENCOUNTER — Telehealth: Payer: Self-pay | Admitting: Neurology

## 2016-11-27 NOTE — Telephone Encounter (Signed)
Patient returning your call. Please Call. Thanks

## 2016-12-01 NOTE — Telephone Encounter (Signed)
We discussed getting MRI cervical spine.  If we do not get answers, the next step may be spinal tap and we can check Lyme on that.

## 2016-12-01 NOTE — Telephone Encounter (Signed)
I called patient back and she was wondering what the next step is.

## 2016-12-02 ENCOUNTER — Other Ambulatory Visit: Payer: Self-pay | Admitting: *Deleted

## 2016-12-02 DIAGNOSIS — R202 Paresthesia of skin: Secondary | ICD-10-CM

## 2016-12-02 DIAGNOSIS — R292 Abnormal reflex: Secondary | ICD-10-CM

## 2016-12-02 NOTE — Telephone Encounter (Signed)
Patient notified and order placed for MRI C-spine.

## 2016-12-16 ENCOUNTER — Ambulatory Visit (INDEPENDENT_AMBULATORY_CARE_PROVIDER_SITE_OTHER): Payer: Medicare Other

## 2016-12-16 DIAGNOSIS — Z23 Encounter for immunization: Secondary | ICD-10-CM | POA: Diagnosis not present

## 2016-12-23 ENCOUNTER — Ambulatory Visit
Admission: RE | Admit: 2016-12-23 | Discharge: 2016-12-23 | Disposition: A | Discharge status: Home / Self Care | Payer: Medicare Other | Source: Ambulatory Visit | Attending: Neurology | Admitting: Neurology

## 2016-12-23 DIAGNOSIS — R292 Abnormal reflex: Secondary | ICD-10-CM

## 2016-12-23 DIAGNOSIS — R202 Paresthesia of skin: Secondary | ICD-10-CM

## 2016-12-23 DIAGNOSIS — M50223 Other cervical disc displacement at C6-C7 level: Secondary | ICD-10-CM | POA: Diagnosis not present

## 2016-12-23 DIAGNOSIS — R209 Unspecified disturbances of skin sensation: Principal | ICD-10-CM

## 2016-12-23 MED ORDER — GADOBENATE DIMEGLUMINE 529 MG/ML IV SOLN
13.0000 mL | Freq: Once | INTRAVENOUS | Status: AC | PRN
  Administered 2016-12-23: 13 mL via INTRAVENOUS

## 2017-01-02 ENCOUNTER — Other Ambulatory Visit: Payer: Self-pay | Admitting: Family Medicine

## 2017-01-03 ENCOUNTER — Other Ambulatory Visit: Payer: Self-pay | Admitting: Family Medicine

## 2017-01-06 NOTE — Progress Notes (Signed)
Follow-up Visit   Date: 01/07/17    Denise Ewing MRN: [id number] DOB: 1945/12/20   Interim History: Denise Ewing is a 71 y.o. Caucasian female with hyperlipidemia, OA, glaucoma, history of melanoma of the thigh, and GERD returning to the clinic for follow-up of generalized paresthesias.  The patient was accompanied to the clinic by self.  History of present illness: Starting ~2017, she began having intermittent left facial numbness which she initially attributed to dental issues, as if she had novocaine. Symptoms are variable and can occur daily for several weeks and then not occur for sometime.  In May 2017, she starting having tingling of the left arm and back and side of the legs which occurs about 2-3 times per week, lasting several days. There are no identifiable triggers, alleviating or exacerbating factors. She denies any new headaches or weakness.  She has vision changes due to gluacoma, but this is stable.  Over the past month, she has spells of lightheadedness which is worse when taking a shower and intermittent dysarthria with swallowing her pills.   Of note, she suffered a mechanical fall over Labor Day weekend while taking her dog for a walk and getting her legs tangled with the leash after being startled by another dog.  She suffered a sizeable left elbow hematoma.  UPDATE 01/07/2017:  She is here for follow-up visit and continues to have tingling involving her face, arms, and legs. Symptoms are always intermittent, except the left facial numbness tends to be more constant.  She underwent MRI brain wwo contrast which was normal.  MRI cervical spine shows left foraminal stenosis at C5-6 and impingement of the left C6 nerve, but nothing that explains her paresthesias.  She denies any new weakness, imbalance, headaches, or vision changes.  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   Medications:  Current Outpatient Medications on File Prior to  Visit  Medication Sig Dispense Refill  . aspirin 81 MG tablet Take 81 mg by mouth daily.    [name] atorvastatin (LIPITOR) 10 MG tablet TAKE ONE TABLET BY MOUTH ONCE DAILY 90 tablet 3  . Calcium Citrate-Vitamin D (CITRACAL/VITAMIN D) 250-200 MG-UNIT TABS Take 2 tablets by mouth at bedtime.     . Coenzyme Q10 (CO Q-10 PO) Take 1 capsule by mouth every morning.    . conjugated estrogens (PREMARIN) vaginal cream Place 1.5 g vaginally every Friday.     . diclofenac sodium (VOLTAREN) 1 % GEL   0  . GLUCOSAMINE CHONDROITIN COMPLX PO Take 2 tablets by mouth every morning.     [name] HYDROcodone-acetaminophen (NORCO/VICODIN) 5-325 MG tablet Take 1-2 tablets by mouth every 4 (four) hours as needed. 8 tablet 0  . meloxicam (MOBIC) 15 MG tablet TAKE 1 TABLET BY MOUTH ONCE DAILY 30 tablet 1  . Multiple Vitamins-Minerals (ALIVE WOMENS 50+ PO) Take 1 tablet by mouth every morning.    [name] OVER THE COUNTER MEDICATION Take 1 tablet by mouth 2 (two) times daily. Tumeric and cumin    . pantoprazole (PROTONIX) 40 MG tablet TAKE 1 TABLET BY MOUTH ONCE DAILY 90 tablet 1  . raloxifene (EVISTA) 60 MG tablet TAKE ONE TABLET BY MOUTH ONCE DAILY 30 tablet 11  . timolol (BETIMOL) 0.5 % ophthalmic solution Place 1 drop into the left eye 2 (two) times daily.    . TRAVATAN Z 0.004 % SOLN ophthalmic solution   1  . ezetimibe (ZETIA) 10 MG tablet Take 0.5 tablets (5 mg total) by mouth  daily. 15 tablet 10   No current facility-administered medications on file prior to visit.     Allergies:  Allergies  Allergen Reactions  . Amoxicillin Hives    Review of Systems:  CONSTITUTIONAL: No fevers, chills, night sweats, or weight loss.  EYES: No visual changes or eye pain ENT: No hearing changes.  No history of nose bleeds.   RESPIRATORY: No cough, wheezing and shortness of breath.   CARDIOVASCULAR: Negative for chest pain, and palpitations.   GI: Negative for abdominal discomfort, blood in stools or black stools.  No recent change in  bowel habits.   GU:  No history of incontinence.   MUSCLOSKELETAL: No history of joint pain or swelling.  No myalgias.   SKIN: Negative for lesions, rash, and itching.   ENDOCRINE: Negative for cold or heat intolerance, polydipsia or goiter.   PSYCH:  No depression or anxiety symptoms.   NEURO: As Above.   Vital Signs:  BP 120/64   Pulse 69   Ht '5\' 2"'$  (1.575 m)   Wt 154 lb 4 oz (70 kg)   SpO2 97%   BMI 28.21 kg/m    General: Well-appearing, comfortable  Neurological Exam: MENTAL STATUS including orientation to time, place, person, recent and remote memory, attention span and concentration, language, and fund of knowledge is normal.  Speech is not dysarthric.  CRANIAL NERVES:  Pupils equal round and reactive to light.  Normal conjugate, extra-ocular eye movements in all directions of gaze.  No ptosis. Normal facial sensation.  Face is symmetric. Palate elevates symmetrically.  Tongue is midline.  MOTOR:  Motor strength is 5/5 in all extremities.  No atrophy, fasciculations or abnormal movements.  No pronator drift.  Tone is normal.    MSRs:  Reflexes are 3+/4 throughout and 2+/4 at the Achilles.  SENSORY:  Intact to vibration and temperature.  COORDINATION/GAIT:  Gait narrow based and stable.   Data: MRI cervical spine 12/13/2016:  Left foraminal stenosis at C5-6 because of encroachment by osteophyte and bulging disc material could compress the left C6 nerve. No central canal stenosis or primary cord lesion. Minimal degenerative changes elsewhere as described.  MRI brain wwo contrast 11/20/2016:  1.  No acute intracranial abnormality.  No explanation for numbness.   2. Minimal to mild for age nonspecific cerebral white matter signal changes, most commonly due to chronic small vessel disease.  Lab Results  Component Value Date   TSH 2.62 08/05/2016   Lab Results  Component Value Date   VITAMINB12 >1500 (H) 08/05/2016    IMPRESSION/PLAN: Denise Ewing is a delightful 71  year-old female returning for follow-up of sporadic migratory paresthesias of the face, arms, and legs.  The paresthesias over the face are most constant.  She has no abnormal findings on exam.  MRI brain wwo contrast which was normal.  MRI cervical spine was personally viewed and shows left foraminal stenosis at C5-6 and impingement of the left C6 nerve, which would only explain left arm paresthesias.  Because of the persistence of symptoms and negative work-up for structural pathology, recommend further serology testing as well as CSF analysis as noted below.   PLAN/RECOMMENDATIONS:  1.  Check ESR, CRP, SSA/B, ACE, SPEP with IFE, 2hr glucose tolerance 2.  CSF cell count and diff, protein, glucose, IgG index, oligoclonal bands, flow cytology, ACE, CSF lyme PCR 3.  Consider NCS/EMG and/or symptomatic medication management  Further recommendations will be based on the results  Greater than 50% of this 25 minute visit  was spent in counseling, explanation of diagnosis, planning of further management, and coordination of care.  Thank you for allowing me to participate in patient's care.  If I can answer any additional questions, I would be pleased to do so.    Sincerely,    Steele Stracener K. Posey Pronto, DO

## 2017-01-07 ENCOUNTER — Encounter: Payer: Self-pay | Admitting: Neurology

## 2017-01-07 ENCOUNTER — Other Ambulatory Visit (INDEPENDENT_AMBULATORY_CARE_PROVIDER_SITE_OTHER): Payer: Medicare Other

## 2017-01-07 ENCOUNTER — Ambulatory Visit: Payer: Medicare Other | Admitting: Neurology

## 2017-01-07 VITALS — BP 120/64 | HR 69 | Ht 62.0 in | Wt 154.2 lb

## 2017-01-07 DIAGNOSIS — R209 Unspecified disturbances of skin sensation: Secondary | ICD-10-CM

## 2017-01-07 DIAGNOSIS — R202 Paresthesia of skin: Secondary | ICD-10-CM | POA: Diagnosis not present

## 2017-01-07 LAB — C-REACTIVE PROTEIN: CRP: 0.1 mg/dL — ABNORMAL LOW (ref 0.5–20.0)

## 2017-01-07 LAB — SEDIMENTATION RATE: SED RATE: 4 mm/h (ref 0–30)

## 2017-01-07 NOTE — Patient Instructions (Signed)
1.  Check labs 2.  Spinal fluid testing with radiology 3.  We may consider nerve testing, if your blood testing and spinal fluid analysis returns normal and will call you with the results to decide the next step.

## 2017-01-08 ENCOUNTER — Other Ambulatory Visit: Payer: Self-pay | Admitting: *Deleted

## 2017-01-08 DIAGNOSIS — R202 Paresthesia of skin: Secondary | ICD-10-CM

## 2017-01-08 DIAGNOSIS — R209 Unspecified disturbances of skin sensation: Principal | ICD-10-CM

## 2017-01-08 DIAGNOSIS — R42 Dizziness and giddiness: Secondary | ICD-10-CM

## 2017-01-08 DIAGNOSIS — R292 Abnormal reflex: Secondary | ICD-10-CM

## 2017-01-09 LAB — PROTEIN ELECTROPHORESIS, SERUM
ALPHA 1: 0.3 g/dL (ref 0.2–0.3)
Albumin ELP: 4.2 g/dL (ref 3.8–4.8)
Alpha 2: 0.6 g/dL (ref 0.5–0.9)
BETA 2: 0.3 g/dL (ref 0.2–0.5)
BETA GLOBULIN: 0.4 g/dL (ref 0.4–0.6)
Gamma Globulin: 0.9 g/dL (ref 0.8–1.7)
Total Protein: 6.7 g/dL (ref 6.1–8.1)

## 2017-01-09 LAB — SJOGREN'S SYNDROME ANTIBODS(SSA + SSB)
SSA (RO) (ENA) ANTIBODY, IGG: NEGATIVE AI
SSB (LA) (ENA) ANTIBODY, IGG: NEGATIVE AI

## 2017-01-09 LAB — IMMUNOFIXATION ELECTROPHORESIS
IMMUNOGLOBULIN A: 95 mg/dL (ref 81–463)
IgG (Immunoglobin G), Serum: 988 mg/dL (ref 694–1618)
IgM, Serum: 62 mg/dL (ref 48–271)
Immunofix Electr Int: NOT DETECTED

## 2017-01-09 LAB — ANGIOTENSIN CONVERTING ENZYME: Angiotensin-Converting Enzyme: 37 U/L (ref 9–67)

## 2017-01-21 DIAGNOSIS — M8589 Other specified disorders of bone density and structure, multiple sites: Secondary | ICD-10-CM | POA: Diagnosis not present

## 2017-01-21 DIAGNOSIS — Z1231 Encounter for screening mammogram for malignant neoplasm of breast: Secondary | ICD-10-CM | POA: Diagnosis not present

## 2017-01-21 DIAGNOSIS — Z780 Asymptomatic menopausal state: Secondary | ICD-10-CM | POA: Diagnosis not present

## 2017-01-21 DIAGNOSIS — M818 Other osteoporosis without current pathological fracture: Secondary | ICD-10-CM | POA: Diagnosis not present

## 2017-01-21 LAB — HM MAMMOGRAPHY: HM Mammogram: NORMAL (ref 0–4)

## 2017-01-21 LAB — HM DEXA SCAN

## 2017-01-22 DIAGNOSIS — Z6828 Body mass index (BMI) 28.0-28.9, adult: Secondary | ICD-10-CM | POA: Diagnosis not present

## 2017-01-22 DIAGNOSIS — Z01419 Encounter for gynecological examination (general) (routine) without abnormal findings: Secondary | ICD-10-CM | POA: Diagnosis not present

## 2017-01-23 ENCOUNTER — Other Ambulatory Visit: Payer: Medicare Other

## 2017-03-04 ENCOUNTER — Other Ambulatory Visit: Payer: Self-pay | Admitting: Family Medicine

## 2017-03-11 DIAGNOSIS — H2513 Age-related nuclear cataract, bilateral: Secondary | ICD-10-CM | POA: Diagnosis not present

## 2017-03-11 DIAGNOSIS — H04123 Dry eye syndrome of bilateral lacrimal glands: Secondary | ICD-10-CM | POA: Diagnosis not present

## 2017-03-11 DIAGNOSIS — H401123 Primary open-angle glaucoma, left eye, severe stage: Secondary | ICD-10-CM | POA: Diagnosis not present

## 2017-03-12 DIAGNOSIS — L821 Other seborrheic keratosis: Secondary | ICD-10-CM | POA: Diagnosis not present

## 2017-03-12 DIAGNOSIS — Z85820 Personal history of malignant melanoma of skin: Secondary | ICD-10-CM | POA: Diagnosis not present

## 2017-03-12 DIAGNOSIS — L300 Nummular dermatitis: Secondary | ICD-10-CM | POA: Diagnosis not present

## 2017-03-12 DIAGNOSIS — Z08 Encounter for follow-up examination after completed treatment for malignant neoplasm: Secondary | ICD-10-CM | POA: Diagnosis not present

## 2017-03-13 ENCOUNTER — Other Ambulatory Visit: Payer: Medicare Other

## 2017-03-20 ENCOUNTER — Other Ambulatory Visit: Payer: Self-pay | Admitting: Family Medicine

## 2017-03-20 ENCOUNTER — Other Ambulatory Visit (HOSPITAL_COMMUNITY)
Admission: RE | Admit: 2017-03-20 | Discharge: 2017-03-20 | Disposition: A | Discharge status: Home / Self Care | Payer: Medicare Other | Source: Ambulatory Visit | Attending: Neurology | Admitting: Neurology

## 2017-03-20 ENCOUNTER — Ambulatory Visit
Admission: RE | Admit: 2017-03-20 | Discharge: 2017-03-20 | Disposition: A | Discharge status: Home / Self Care | Payer: Medicare Other | Source: Ambulatory Visit | Attending: Neurology | Admitting: Neurology

## 2017-03-20 VITALS — BP 143/73 | HR 67

## 2017-03-20 DIAGNOSIS — R292 Abnormal reflex: Secondary | ICD-10-CM

## 2017-03-20 DIAGNOSIS — R42 Dizziness and giddiness: Secondary | ICD-10-CM

## 2017-03-20 DIAGNOSIS — R209 Unspecified disturbances of skin sensation: Secondary | ICD-10-CM | POA: Insufficient documentation

## 2017-03-20 DIAGNOSIS — R202 Paresthesia of skin: Secondary | ICD-10-CM

## 2017-03-20 NOTE — Progress Notes (Signed)
Blood work obtained from Left wrist. Site unremarkable. Pt tolerated well.

## 2017-03-20 NOTE — Discharge Instructions (Signed)

## 2017-03-27 ENCOUNTER — Telehealth: Payer: Self-pay | Admitting: *Deleted

## 2017-03-27 NOTE — Telephone Encounter (Signed)
Patient given results

## 2017-03-27 NOTE — Telephone Encounter (Signed)
-----   Message from Alda Berthold, DO sent at 03/27/2017  9:46 AM EST ----- Please call and inform patient that her spinal fluid results are all back, except for one which takes a little longer to result.  They all look normal - no signs of inflammatory disease or lyme disease causing her symptoms.  I will post the last lab to MyChart once it is back.

## 2017-03-31 ENCOUNTER — Telehealth: Payer: Self-pay | Admitting: Neurology

## 2017-03-31 LAB — CSF CELL COUNT WITH DIFFERENTIAL
RBC Count, CSF: 0 cells/uL (ref 0–10)
WBC, CSF: 2 cells/uL (ref 0–5)

## 2017-03-31 LAB — BORRELIA SPECIES DNA, FLUID, PCR: BBURGDNAFLU: NOT DETECTED

## 2017-03-31 LAB — CNS IGG SYNTHESIS RATE, CSF+BLOOD
ALBUMIN,CSF: 17.8 mg/dL (ref 8.0–42.0)
Albumin Serum: 4.8 g/dL — ABNORMAL HIGH (ref 3.2–4.6)
CNS-IgG Synthesis Rate: -2.7 mg/24 h (ref <=3.3)
IGG TOTAL CSF: 2 mg/dL (ref 0.8–7.7)
IGG-INDEX: 0.52 (ref <0.66)
IgG (Immunoglobin G), Serum: 1040 mg/dL (ref 694–1618)

## 2017-03-31 LAB — OLIGOCLONAL BANDS, CSF + SERM

## 2017-03-31 LAB — ANGIOTENSIN CONVERTING ENZYME, CSF: ACE, CSF: 9 U/L (ref <=15)

## 2017-03-31 LAB — PROTEIN, CSF: Total Protein, CSF: 38 mg/dL (ref 15–60)

## 2017-03-31 LAB — GLUCOSE, CSF: Glucose, CSF: 57 mg/dL (ref 40–80)

## 2017-03-31 NOTE — Telephone Encounter (Signed)
Called and informed patient that her CSF results are normal, no signs of inflammation, Lyme, or malignancy.  Offered NCS/EMG of the arm and leg given ongoing symptoms, but at this juncture, will wait to see how her paresthesias evolve. Neuralgesic medication was declined. She will contact my office, if paresthesias get worse or she develops new neurological symptoms.  All questions answered.  Donika K. Posey Pronto, DO

## 2017-04-08 ENCOUNTER — Other Ambulatory Visit: Payer: Self-pay | Admitting: Internal Medicine

## 2017-04-10 MED ORDER — EZETIMIBE 10 MG PO TABS: ORAL_TABLET | ORAL | 0 refills | Status: DC

## 2017-04-10 NOTE — Addendum Note (Signed)
Addended by: Derl Barrow on: 04/10/2017 08:45 AM   Modules accepted: Orders

## 2017-04-13 ENCOUNTER — Other Ambulatory Visit: Payer: Self-pay

## 2017-04-13 ENCOUNTER — Encounter: Payer: Self-pay | Admitting: Physician Assistant

## 2017-04-13 ENCOUNTER — Ambulatory Visit: Payer: Medicare Other | Admitting: Physician Assistant

## 2017-04-13 VITALS — BP 98/58 | HR 73 | Temp 98.3°F | Resp 14 | Ht 62.0 in | Wt 152.0 lb

## 2017-04-13 DIAGNOSIS — J069 Acute upper respiratory infection, unspecified: Secondary | ICD-10-CM

## 2017-04-13 DIAGNOSIS — B9789 Other viral agents as the cause of diseases classified elsewhere: Secondary | ICD-10-CM

## 2017-04-13 MED ORDER — BENZONATATE 100 MG PO CAPS: 100.0000 mg | ORAL_CAPSULE | Freq: Three times a day (TID) | ORAL | 0 refills | Status: DC | PRN

## 2017-04-13 NOTE — Progress Notes (Signed)
Patient presents to clinic today c/o 5 days of chest congestion with a cough that was initially dry but has become productive of clear sputum since this morning. Notes some chest tenderness from coughing. Notes sinus pressure, left sinus pain. Denies fever, chills or SOB. Husband recently sick with a viral URI but is better now. Has taken Mucinex and Robitussin for symptom relief.    Past Medical History:  Diagnosis Date  . GERD (gastroesophageal reflux disease)   . Glaucoma   . History of colonic polyps   . Melanoma of thigh ([facility])     Current Outpatient Medications on File Prior to Visit  Medication Sig Dispense Refill  . aspirin 81 MG tablet Take 81 mg by mouth daily.    [name] atorvastatin (LIPITOR) 10 MG tablet TAKE ONE TABLET BY MOUTH ONCE DAILY 90 tablet 3  . Calcium Citrate-Vitamin D (CITRACAL/VITAMIN D) 250-200 MG-UNIT TABS Take 2 tablets by mouth at bedtime.     . Coenzyme Q10 (CO Q-10 PO) Take 1 capsule by mouth every morning.    . conjugated estrogens (PREMARIN) vaginal cream Place 1.5 g vaginally every Friday.     . ezetimibe (ZETIA) 10 MG tablet TAKE 1/2 (ONE-HALF) TABLET BY MOUTH ONCE DAILY 15 tablet 0  . GLUCOSAMINE CHONDROITIN COMPLX PO Take 2 tablets by mouth every morning.     . meloxicam (MOBIC) 15 MG tablet TAKE 1 TABLET BY MOUTH ONCE DAILY 30 tablet 1  . Multiple Vitamins-Minerals (ALIVE WOMENS 50+ PO) Take 1 tablet by mouth every morning.    [name] OVER THE COUNTER MEDICATION Take 1 tablet by mouth 2 (two) times daily. Tumeric and cumin    . pantoprazole (PROTONIX) 40 MG tablet TAKE 1 TABLET BY MOUTH ONCE DAILY 90 tablet 1  . raloxifene (EVISTA) 60 MG tablet TAKE ONE TABLET BY MOUTH ONCE DAILY 30 tablet 11  . timolol (BETIMOL) 0.5 % ophthalmic solution Place 1 drop into the left eye 2 (two) times daily.    . TRAVATAN Z 0.004 % SOLN ophthalmic solution   1   No current facility-administered medications on file prior to visit.     Allergies  Allergen Reactions  .  Amoxicillin Hives    Family History  Problem Relation Age of Onset  . Hypertension Mother   . Cancer Mother        breast,anal  . Osteoporosis Mother   . CAD Sister   . Hypothyroidism Sister     Social History   Socioeconomic History  . Marital status: Married    Spouse name: None  . Number of children: None  . Years of education: None  . Highest education level: None  Social Needs  . Financial resource strain: None  . Food insecurity - worry: None  . Food insecurity - inability: None  . Transportation needs - medical: None  . Transportation needs - non-medical: None  Occupational History  . None  Tobacco Use  . Smoking status: Never Smoker  . Smokeless tobacco: Never Used  Substance and Sexual Activity  . Alcohol use: Yes    Comment:  occasionally  . Drug use: No  . Sexual activity: None  Other Topics Concern  . None  Social History Narrative  . None   Review of Systems - See HPI.  All other ROS are negative.  BP (!) 98/58   Pulse 73   Temp 98.3 F (36.8 C) (Oral)   Resp 14   Ht 5\' 2"  (1.575 m)   Wt  152 lb (68.9 kg)   SpO2 97%   BMI 27.80 kg/m   Physical Exam  Constitutional: She is oriented to person, place, and time and well-developed, well-nourished, and in no distress.  HENT:  Head: Normocephalic and atraumatic.  Eyes: Conjunctivae are normal.  Neck: Neck supple.  Neurological: She is alert and oriented to person, place, and time.  Skin: Skin is warm and dry. No rash noted.  Psychiatric: Affect normal.  Vitals reviewed.   Recent Results (from the past 2160 hour(s))  CSF cell count with differential     Status: None   Collection Time: 03/20/17  9:14 AM  Result Value Ref Range   Color, CSF COLORLESS COLORLESS   Appearance, CSF CLEAR CLEAR   RBC Count, CSF 0 0 - 10 cells/uL   WBC, CSF 2 0 - 5 cells/uL   Segmented Neutrophils-CSF CANCELED     Comment: *  * Unable to report due to           *  * insufficient analyzable cells.    *   *  Result canceled by the ancillary.   Protein, CSF     Status: None   Collection Time: 03/20/17  9:14 AM  Result Value Ref Range   Total Protein, CSF 38 15 - 60 mg/dL  Glucose, CSF     Status: None   Collection Time: 03/20/17  9:14 AM  Result Value Ref Range   Glucose, CSF 57 40 - 80 mg/dL  Oligoclonal bands, CSF + serm     Status: None   Collection Time: 03/20/17  9:14 AM  Result Value Ref Range   Oligo Bands see note     Comment: No bands                      Reference Range: No bands . No Oligoclonal bands are identified in the patient's CSF when compared to the corresponding Serum. . Oligoclonal bands are present in the CSF of more than 85% of patients with clinically definite multiple sclerosis (MS). To distinguish between oligoclonal bands in the CSF due to a peripheral gammopathy and oligoclonal bands due to local production in the CNS, serum and CSF should be tested simultaneously. Oligoclonal bands can however be observed in a variety of other diseases, e.g., subacute sclerosing panen- cephalitis, inflammatory polyneuropathy, CNS lupus, and brain tumors and infarctions. The clinical significance of a numerical band count, determined by isoelectric focusing, has not been definitively defined. The data should be interpreted in conjunction with all pertinent clinical and laboratory data for this patient. .   CNS IgG synthesis rate, CSF+blood     Status: Abnormal   Collection Time: 03/20/17  9:14 AM  Result Value Ref Range   CNS-IgG Synthesis Rate -2.7 -9.9 - 3.3 mg/24 h   IgG-Index 0.52 <0.66    Comment: . The IgG Synthesis rate, CSF and IgG index, CSF are two formulae for estimating the amount of IgG produced in the central nervous system.  Evidence of increased synthesis of IgG provides support for the diagnosis of multiple sclerosis. .    Albumin, CSF 17.8 8.0 - 42.0 mg/dL   IgG Total CSF 2.0 0.8 - 7.7 mg/dL   IgG (Immunoglobin G), Serum 1,040 694 -  1,618 mg/dL   Albumin Serum 4.8 (H) 3.2 - 4.6 g/dL  Angiotensin converting enzyme, CSF     Status: None   Collection Time: 03/20/17  9:14 AM  Result Value Ref Range  ACE, CSF 9 <=15 U/L  Borrelia species DNA (FLUID), PCR (Lyme)     Status: None   Collection Time: 03/20/17  9:14 AM  Result Value Ref Range   Specimen Source (CALR) CSF     Comment: .   B. burgdorferi DNA Not detected Not Detect    Comment: . This test was developed and its analytical performance characteristics have been determined by [employer], [place], [place]. It has not been cleared or approved by the U.S. Food and Drug Administration. This assay has been validated pursuant to the CLIA regulations and is used for clinical purposes. .    Assessment/Plan: 1. Viral URI with cough Symptoms improving per patient. Start Tessalon and Mucinex. Saline nasal rinses. Supportive measures reviewed. Follow-up if not continuing to resolve.  - benzonatate (TESSALON) 100 MG capsule; Take 1 capsule (100 mg total) by mouth 3 (three) times daily as needed for cough.  Dispense: 30 capsule; Refill: 0   Leeanne Rio, PA-C

## 2017-04-13 NOTE — Patient Instructions (Signed)
Please stay well-hydrated and get plenty of rest.  Continue Mucinex-DM twice daily. Use the Tessalon as directed.  Call me if symptoms are not continuing to improve/resolve.

## 2017-04-17 ENCOUNTER — Encounter: Payer: Self-pay | Admitting: Internal Medicine

## 2017-04-17 ENCOUNTER — Ambulatory Visit: Payer: Medicare Other | Admitting: Internal Medicine

## 2017-04-17 VITALS — BP 142/78 | HR 64 | Ht 62.0 in | Wt 152.2 lb

## 2017-04-17 DIAGNOSIS — I2510 Atherosclerotic heart disease of native coronary artery without angina pectoris: Secondary | ICD-10-CM | POA: Diagnosis not present

## 2017-04-17 DIAGNOSIS — I10 Essential (primary) hypertension: Secondary | ICD-10-CM | POA: Diagnosis not present

## 2017-04-17 DIAGNOSIS — E782 Mixed hyperlipidemia: Secondary | ICD-10-CM

## 2017-04-17 DIAGNOSIS — R072 Precordial pain: Secondary | ICD-10-CM | POA: Diagnosis not present

## 2017-04-17 NOTE — Progress Notes (Signed)
Cardiology Office Note   Date:  04/17/2017   ID:  Denise Ewing, DOB June 11, 1945, MRN [id number]  PCP:  [name], MD  Cardiologist:   Dorris Carnes, MD   FU of HL and CAD      History of Present Illness: Denise Ewing is a 72 y.o. female with a history of atypical CP, CAD by CT angiogram (Stenosis 50% or less LAD), HL  I saw her in 2018  SInce seen her breathing has been good She denies CP  Active  WOrks at daughters office Notices occasional swelling in R leg not left leg    Has had some facial numbness on the left  Intermittent   Seen by neuro  Extensive work up so far negative     Current Meds  Medication Sig  . aspirin 81 MG tablet Take 81 mg by mouth daily.  [name] atorvastatin (LIPITOR) 10 MG tablet TAKE ONE TABLET BY MOUTH ONCE DAILY  . benzonatate (TESSALON) 100 MG capsule Take 1 capsule (100 mg total) by mouth 3 (three) times daily as needed for cough.  . Calcium Citrate-Vitamin D (CITRACAL/VITAMIN D) 250-200 MG-UNIT TABS Take 2 tablets by mouth at bedtime.   . Coenzyme Q10 (CO Q-10 PO) Take 1 capsule by mouth every morning.  . conjugated estrogens (PREMARIN) vaginal cream Place 1.5 g vaginally every Friday.   . ezetimibe (ZETIA) 10 MG tablet TAKE 1/2 (ONE-HALF) TABLET BY MOUTH ONCE DAILY  . GLUCOSAMINE CHONDROITIN COMPLX PO Take 2 tablets by mouth every morning.   . meloxicam (MOBIC) 15 MG tablet TAKE 1 TABLET BY MOUTH ONCE DAILY  . Multiple Vitamins-Minerals (ALIVE WOMENS 50+ PO) Take 1 tablet by mouth every morning.  [name] OVER THE COUNTER MEDICATION Take 1 tablet by mouth 2 (two) times daily. Tumeric and cumin  . pantoprazole (PROTONIX) 40 MG tablet TAKE 1 TABLET BY MOUTH ONCE DAILY  . raloxifene (EVISTA) 60 MG tablet TAKE ONE TABLET BY MOUTH ONCE DAILY  . timolol (BETIMOL) 0.5 % ophthalmic solution Place 1 drop into the left eye 2 (two) times daily.  . TRAVATAN Z 0.004 % SOLN ophthalmic solution      Allergies:   Amoxicillin   Past Medical History:  Diagnosis  Date  . GERD (gastroesophageal reflux disease)   . Glaucoma   . History of colonic polyps   . Melanoma of thigh [facility])     Past Surgical History:  Procedure Laterality Date  . Colon polyps removed    . Melanoma removed     R thigh  . TOTAL ABDOMINAL HYSTERECTOMY W/ BILATERAL SALPINGOOPHORECTOMY     uterine fibroids     Social History:  The patient  reports that  has never smoked. she has never used smokeless tobacco. She reports that she drinks alcohol. She reports that she does not use drugs.   Family History:  The patient's family history includes CAD in her sister; Cancer in her mother; Hypertension in her mother; Hypothyroidism in her sister; Osteoporosis in her mother.    ROS:  Please see the history of present illness. All other systems are reviewed and  Negative to the above problem except as noted.    PHYSICAL EXAM: VS:  BP (!) 142/78   Pulse 64   Ht 5\' 2"  (1.575 m)   Wt 152 lb 3.2 oz (69 kg)   BMI 27.84 kg/m   GEN: Well nourished, well developed, in no acute distress  HEENT: normal  Neck: JVP is normal  No, carotid  bruits, or masses Cardiac: RRR; no murmurs, rubs, or gallops,no edema  Respiratory:  clear to auscultation bilaterally, normal work of breathing GI: soft, nontender, nondistended, + BS  No hepatomegaly  MS: no deformity Moving all extremities   Skin: warm and dry, no rash Neuro:  Strength and sensation are intact Psych: euthymic mood, full affect   EKG:  EKG is ordered today. SR 64 bpm   Lipid Panel    Component Value Date/Time   CHOL 127 08/05/2016 0946   CHOL 110 04/10/2016 1143   CHOL 96 (L) 12/21/2014 0824   TRIG 80.0 08/05/2016 0946   TRIG 66 12/21/2014 0824   HDL 52.90 08/05/2016 0946   HDL 54 04/10/2016 1143   HDL 45 (L) 12/21/2014 0824   CHOLHDL 2 08/05/2016 0946   VLDL 16.0 08/05/2016 0946   LDLCALC 58 08/05/2016 0946   LDLCALC 45 04/10/2016 1143   LDLCALC 38 12/21/2014 0824      Wt Readings from Last 3 Encounters:  04/17/17  152 lb 3.2 oz (69 kg)  04/13/17 152 lb (68.9 kg)  01/07/17 154 lb 4 oz (70 kg)      ASSESSMENT AND PLAN:   1  CAD PT with Ca score of 126 with plaquing of the LAD   She is asymptomatic  She is doing well  Remains active   I would keep on ASA and also liptor  Lipids last June were excellent  I will be available as needed if there is any concern of symptoms (SOB, chest discomfort, inability to do what she wants)  2  HL  Lipids will get checked in summer   3  Blood pressure   BP is usually good  IT is a little high for her  A couple weeks ago it was low (had URI at time )  I told her to follow in a few wks after she has recovered.  Goall less than 130/  Encouraged her to stay active.  Continue aerobic activity  Will be avalable as needed as I noted above    Current medicines are reviewed at length with the patient today.  The patient does not have concerns regarding medicines.  Signed, Dorris Carnes, MD  04/17/2017 8:21 AM    [facility] [address] [place], [place], [place]  [address] Phone: [phone]; Fax: [phone]

## 2017-04-17 NOTE — Patient Instructions (Signed)
Your physician recommends that you continue on your current medications as directed. Please refer to the Current Medication list given to you today. Your physician recommends that you schedule a follow-up appointment as needed with Dr. Harrington Challenger.

## 2017-05-03 ENCOUNTER — Other Ambulatory Visit: Payer: Self-pay | Admitting: Family Medicine

## 2017-05-06 ENCOUNTER — Ambulatory Visit: Payer: Medicare Other | Admitting: Neurology

## 2017-05-12 ENCOUNTER — Other Ambulatory Visit: Payer: Self-pay | Admitting: Internal Medicine

## 2017-06-15 DIAGNOSIS — M25532 Pain in left wrist: Secondary | ICD-10-CM | POA: Diagnosis not present

## 2017-06-15 DIAGNOSIS — R52 Pain, unspecified: Secondary | ICD-10-CM | POA: Diagnosis not present

## 2017-06-22 ENCOUNTER — Other Ambulatory Visit: Payer: Self-pay | Admitting: Family Medicine

## 2017-06-29 DIAGNOSIS — S63592A Other specified sprain of left wrist, initial encounter: Secondary | ICD-10-CM | POA: Diagnosis not present

## 2017-07-07 ENCOUNTER — Encounter: Payer: Self-pay | Admitting: Physician Assistant

## 2017-07-07 ENCOUNTER — Other Ambulatory Visit: Payer: Self-pay

## 2017-07-07 ENCOUNTER — Ambulatory Visit: Payer: Medicare Other | Admitting: Physician Assistant

## 2017-07-07 VITALS — BP 138/60 | HR 74 | Temp 98.6°F | Resp 14 | Ht 62.0 in | Wt 152.0 lb

## 2017-07-07 DIAGNOSIS — S63592A Other specified sprain of left wrist, initial encounter: Secondary | ICD-10-CM

## 2017-07-07 DIAGNOSIS — J209 Acute bronchitis, unspecified: Secondary | ICD-10-CM | POA: Diagnosis not present

## 2017-07-07 MED ORDER — DOXYCYCLINE HYCLATE 100 MG PO CAPS: 100.0000 mg | ORAL_CAPSULE | Freq: Two times a day (BID) | ORAL | 0 refills | Status: DC

## 2017-07-07 MED ORDER — BENZONATATE 100 MG PO CAPS: 100.0000 mg | ORAL_CAPSULE | Freq: Two times a day (BID) | ORAL | 0 refills | Status: DC | PRN

## 2017-07-07 MED ORDER — ALBUTEROL SULFATE HFA 108 (90 BASE) MCG/ACT IN AERS: 2.0000 | INHALATION_SPRAY | Freq: Four times a day (QID) | RESPIRATORY_TRACT | 0 refills | Status: DC | PRN

## 2017-07-07 NOTE — Progress Notes (Signed)
Patient presents to clinic today c/o 1 week of progressively worsening chest congestion and cough that is mainly dry but notes rattling in her chest. Denies wheezing or SOB. Denies fever, chills, sinus pain, ear pain, tooth pain. Has been taking Mucinex-DM for symptoms.  Denies recent travel or sick contact.   Patient with > 1 month of significant ulna-sided wrist pain. EMR records reviewed in [place]. Was initially seen by Orthopedic surgery through [facility] on 06/15/17 and was medrol dose pack and bracing for conservative therapy. X-ray at that time was unremarkable per report in EMR. Patient endorses taking medications and following instructions as directed with worsening symptoms. Had reassessment at the same practice on 06/29/17 at which time MRI was ordered and she was referred to hand surgery. States that she has not heard from them despite calling them. Wants to see specialist in [facility] system. Is wondering if we can order her imaging.   Past Medical History:  Diagnosis Date  . GERD (gastroesophageal reflux disease)   . Glaucoma   . History of colonic polyps   . Melanoma of thigh ([place])     Current Outpatient Medications on File Prior to Visit  Medication Sig Dispense Refill  . aspirin 81 MG tablet Take 81 mg by mouth daily.    [name] atorvastatin (LIPITOR) 10 MG tablet TAKE ONE TABLET BY MOUTH ONCE DAILY 90 tablet 3  . Calcium Citrate-Vitamin D (CITRACAL/VITAMIN D) 250-200 MG-UNIT TABS Take 2 tablets by mouth at bedtime.     . Coenzyme Q10 (CO Q-10 PO) Take 1 capsule by mouth every morning.    . conjugated estrogens (PREMARIN) vaginal cream Place 1.5 g vaginally every Friday.     . ezetimibe (ZETIA) 10 MG tablet TAKE 1/2 (ONE-HALF) TABLET BY MOUTH ONCE DAILY 45 tablet 3  . GLUCOSAMINE CHONDROITIN COMPLX PO Take 2 tablets by mouth every morning.     . meloxicam (MOBIC) 15 MG tablet Take 1 tablet (15 mg total) by mouth daily. Please call [phone] to schedule a Cholesterol follow up 30  tablet 1  . Multiple Vitamins-Minerals (ALIVE WOMENS 50+ PO) Take 1 tablet by mouth every morning.    [name] OVER THE COUNTER MEDICATION Take 1 tablet by mouth 2 (two) times daily. Tumeric and cumin    . pantoprazole (PROTONIX) 40 MG tablet TAKE 1 TABLET BY MOUTH ONCE DAILY 90 tablet 1  . raloxifene (EVISTA) 60 MG tablet TAKE ONE TABLET BY MOUTH ONCE DAILY 30 tablet 11  . timolol (BETIMOL) 0.5 % ophthalmic solution Place 1 drop into the left eye 2 (two) times daily.    . TRAVATAN Z 0.004 % SOLN ophthalmic solution   1   No current facility-administered medications on file prior to visit.     Allergies  Allergen Reactions  . Amoxicillin Hives    Family History  Problem Relation Age of Onset  . Hypertension Mother   . Cancer Mother        breast,anal  . Osteoporosis Mother   . CAD Sister   . Hypothyroidism Sister     Social History   Socioeconomic History  . Marital status: Married    Spouse name: Not on file  . Number of children: Not on file  . Years of education: Not on file  . Highest education level: Not on file  Occupational History  . Not on file  Social Needs  . Financial resource strain: Not on file  . Food insecurity:    Worry: Not on  file    Inability: Not on file  . Transportation needs:    Medical: Not on file    Non-medical: Not on file  Tobacco Use  . Smoking status: Never Smoker  . Smokeless tobacco: Never Used  Substance and Sexual Activity  . Alcohol use: Yes    Comment:  occasionally  . Drug use: No  . Sexual activity: Not on file  Lifestyle  . Physical activity:    Days per week: Not on file    Minutes per session: Not on file  . Stress: Not on file  Relationships  . Social connections:    Talks on phone: Not on file    Gets together: Not on file    Attends religious service: Not on file    Active member of club or organization: Not on file    Attends meetings of clubs or organizations: Not on file    Relationship status: Not on file    Other Topics Concern  . Not on file  Social History Narrative  . Not on file   Review of Systems - See HPI.  All other ROS are negative.  BP 138/60   Pulse 74   Temp 98.6 F (37 C) (Oral)   Resp 14   Ht 5\' 2"  (1.575 m)   Wt 152 lb (68.9 kg)   SpO2 99%   BMI 27.80 kg/m   Physical Exam  Constitutional: She appears well-developed and well-nourished.  HENT:  Head: Normocephalic and atraumatic.  Right Ear: External ear normal.  Left Ear: External ear normal.  Nose: Nose normal.  Mouth/Throat: Oropharynx is clear and moist.  Eyes: Conjunctivae are normal.  Neck: Neck supple.  Cardiovascular: Normal rate, regular rhythm and normal heart sounds.  Pulmonary/Chest: Effort normal and breath sounds normal. No stridor. No respiratory distress. She has no wheezes. She has no rales. She exhibits no tenderness.  Lymphadenopathy:    She has no cervical adenopathy.  Vitals reviewed.  Assessment/Plan: 1. TFCC (triangular fibrocartilage complex) tear, left, initial encounter Noted on assessment by Orthopedics. Unremarkable x-ray with worsening symptoms. MRI ordered for further assessment. Referral to Hand surgery placed.  - MR WRIST LEFT W WO CONTRAST; Future - Ambulatory referral to Hand Surgery  2. Acute bronchitis with bronchospasm Start ABX. Increase fluids. Continue Mucinex. Tessalon per orders. Will give albuterol MDI for bronchospasm. Instructions on use reviewed. Supportive measures discussed. Follow-up if not improving/resolving.   - doxycycline (VIBRAMYCIN) 100 MG capsule; Take 1 capsule (100 mg total) by mouth 2 (two) times daily.  Dispense: 20 capsule; Refill: 0 - benzonatate (TESSALON) 100 MG capsule; Take 1 capsule (100 mg total) by mouth 2 (two) times daily as needed for cough.  Dispense: 20 capsule; Refill: 0 - albuterol (PROVENTIL HFA;VENTOLIN HFA) 108 (90 Base) MCG/ACT inhaler; Inhale 2 puffs into the lungs every 6 (six) hours as needed for wheezing or shortness of  breath.  Dispense: 1 Inhaler; Refill: 0   Leeanne Rio, PA-C

## 2017-07-07 NOTE — Patient Instructions (Signed)
Stop by front desk to speak with the ladies concerning MRI and referral to hand surgery.  Take antibiotic (Doxycycline) as directed.  Increase fluids.  Get plenty of rest. Use Mucinex for congestion. Tessalon as directed. Take a daily probiotic (I recommend Align or Culturelle, but even Activia Yogurt may be beneficial).  A humidifier placed in the bedroom may offer some relief for a dry, scratchy throat of nasal irritation.  Read information below on acute bronchitis. Please call or return to clinic if symptoms are not improving.  Acute Bronchitis Bronchitis is when the airways that extend from the windpipe into the lungs get red, puffy, and painful (inflamed). Bronchitis often causes thick spit (mucus) to develop. This leads to a cough. A cough is the most common symptom of bronchitis. In acute bronchitis, the condition usually begins suddenly and goes away over time (usually in 2 weeks). Smoking, allergies, and asthma can make bronchitis worse. Repeated episodes of bronchitis may cause more lung problems.  HOME CARE  Rest.  Drink enough fluids to keep your pee (urine) clear or pale yellow (unless you need to limit fluids as told by your doctor).  Only take over-the-counter or prescription medicines as told by your doctor.  Avoid smoking and secondhand smoke. These can make bronchitis worse. If you are a smoker, think about using nicotine gum or skin patches. Quitting smoking will help your lungs heal faster.  Reduce the chance of getting bronchitis again by:  Washing your hands often.  Avoiding people with cold symptoms.  Trying not to touch your hands to your mouth, nose, or eyes.  Follow up with your doctor as told.  GET HELP IF: Your symptoms do not improve after 1 week of treatment. Symptoms include:  Cough.  Fever.  Coughing up thick spit.  Body aches.  Chest congestion.  Chills.  Shortness of breath.  Sore throat.  GET HELP RIGHT AWAY IF:   You have an  increased fever.  You have chills.  You have severe shortness of breath.  You have bloody thick spit (sputum).  You throw up (vomit) often.  You lose too much body fluid (dehydration).  You have a severe headache.  You faint.  MAKE SURE YOU:   Understand these instructions.  Will watch your condition.  Will get help right away if you are not doing well or get worse. Document Released: 07/30/2007 Document Revised: 10/13/2012 Document Reviewed: 08/03/2012 [facility] Patient Information 2015 [place], [place]. This information is not intended to replace advice given to you by your health care provider. Make sure you discuss any questions you have with your health care provider.   How to Use a Metered Dose Inhaler A metered dose inhaler is a handheld device for taking medicine that must be breathed into the lungs (inhaled). The device can be used to deliver a variety of inhaled medicines, including:  Quick relief or rescue medicines, such as bronchodilators.  Controller medicines, such as corticosteroids.  The medicine is delivered by pushing down on a metal canister to release a preset amount of spray and medicine. Each device contains the amount of medicine that is needed for a preset number of uses (inhalations). Your health care provider may recommend that you use a spacer with your inhaler to help you take the medicine more effectively. A spacer is a plastic tube with a mouthpiece on one end and an opening that connects to the inhaler on the other end. A spacer holds the medicine in a tube for a  short time, which allows you to inhale more medicine. What are the risks? If you do not use your inhaler correctly, medicine might not reach your lungs to help you breathe. Inhaler medicine can cause side effects, such as:  Mouth or throat infection.  Cough.  Hoarseness.  Headache.  Nausea and vomiting.  Lung infection (pneumonia) in people who have a lung condition called  COPD.  How to use a metered dose inhaler without a spacer 1. Remove the cap from the inhaler. 2. If you are using the inhaler for the first time, shake it for 5 seconds, turn it away from your face, then release 4 puffs into the air. This is called priming. 3. Shake the inhaler for 5 seconds. 4. Position the inhaler so the top of the canister faces up. 5. Put your index finger on the top of the medicine canister. Support the bottom of the inhaler with your thumb. 6. Breathe out normally and as completely as possible, away from the inhaler. 7. Either place the inhaler between your teeth and close your lips tightly around the mouthpiece, or hold the inhaler 1-2 inches (2.5-5 cm) away from your open mouth. Keep your tongue down out of the way. If you are unsure which technique to use, ask your health care provider. 8. Press the canister down with your index finger to release the medicine, then inhale deeply and slowly through your mouth (not your nose) until your lungs are completely filled. Inhaling should take 4-6 seconds. 9. Hold the medicine in your lungs for 5-10 seconds (10 seconds is best). This helps the medicine get into the small airways of your lungs. 10. With your lips in a tight circle (pursed), breathe out slowly. 11. Repeat steps 3-10 until you have taken the number of puffs that your health care provider directed. Wait about 1 minute between puffs or as directed. 12. Put the cap on the inhaler. 13. If you are using a steroid inhaler, rinse your mouth with water, gargle, and spit out the water. Do not swallow the water. How to use a metered dose inhaler with a spacer 1. Remove the cap from the inhaler. 2. If you are using the inhaler for the first time, shake it for 5 seconds, turn it away from your face, then release 4 puffs into the air. This is called priming. 3. Shake the inhaler for 5 seconds. 4. Place the open end of the spacer onto the inhaler mouthpiece. 5. Position the  inhaler so the top of the canister faces up and the spacer mouthpiece faces you. 6. Put your index finger on the top of the medicine canister. Support the bottom of the inhaler and the spacer with your thumb. 7. Breathe out normally and as completely as possible, away from the spacer. 8. Place the spacer between your teeth and close your lips tightly around it. Keep your tongue down out of the way. 9. Press the canister down with your index finger to release the medicine, then inhale deeply and slowly through your mouth (not your nose) until your lungs are completely filled. Inhaling should take 4-6 seconds. 10. Hold the medicine in your lungs for 5-10 seconds (10 seconds is best). This helps the medicine get into the small airways of your lungs. 11. With your lips in a tight circle (pursed), breathe out slowly. 12. Repeat steps 3-11 until you have taken the number of puffs that your health care provider directed. Wait about 1 minute between puffs or as directed.  13. Remove the spacer from the inhaler and put the cap on the inhaler. 14. If you are using a steroid inhaler, rinse your mouth with water, gargle, and spit out the water. Do not swallow the water. Follow these instructions at home:  Take your inhaled medicine only as told by your health care provider. Do not use the inhaler more than directed by your health care provider.  Keep all follow-up visits as told by your health care provider. This is important.  If your inhaler has a counter, you can check it to determine how full your inhaler is. If your inhaler does not have a counter, ask your health care provider when you will need to refill your inhaler and write the refill date on a calendar or on your inhaler canister. Note that you cannot know when an inhaler is empty by shaking it.  Follow directions on the package insert for care and cleaning of your inhaler and spacer. Contact a health care provider if:  Symptoms are only partially  relieved with your inhaler.  You are having trouble using your inhaler.  You have an increase in phlegm.  You have headaches. Get help right away if:  You feel little or no relief after using your inhaler.  You have dizziness.  You have a fast heart rate.  You have chills or a fever.  You have night sweats.  There is blood in your phlegm. Summary  A metered dose inhaler is a handheld device for taking medicine that must be breathed into the lungs (inhaled).  The medicine is delivered by pushing down on a metal canister to release a preset amount of spray and medicine.  Each device contains the amount of medicine that is needed for a preset number of uses (inhalations). This information is not intended to replace advice given to you by your health care provider. Make sure you discuss any questions you have with your health care provider. Document Released: 02/10/2005 Document Revised: 01/01/2016 Document Reviewed: 01/01/2016 Elsevier Interactive Patient Education  2017 [employer].

## 2017-07-07 NOTE — Addendum Note (Signed)
Addended by: Brunetta Jeans on: 07/07/2017 04:05 PM   Modules accepted: Orders

## 2017-07-08 ENCOUNTER — Ambulatory Visit (HOSPITAL_COMMUNITY)
Admission: RE | Admit: 2017-07-08 | Discharge: 2017-07-08 | Disposition: A | Discharge status: Home / Self Care | Payer: Medicare Other | Source: Ambulatory Visit | Attending: Physician Assistant | Admitting: Physician Assistant

## 2017-07-08 DIAGNOSIS — M189 Osteoarthritis of first carpometacarpal joint, unspecified: Secondary | ICD-10-CM | POA: Diagnosis not present

## 2017-07-08 DIAGNOSIS — S56512A Strain of other extensor muscle, fascia and tendon at forearm level, left arm, initial encounter: Secondary | ICD-10-CM | POA: Diagnosis not present

## 2017-07-08 DIAGNOSIS — X58XXXA Exposure to other specified factors, initial encounter: Secondary | ICD-10-CM | POA: Insufficient documentation

## 2017-07-08 DIAGNOSIS — J209 Acute bronchitis, unspecified: Secondary | ICD-10-CM

## 2017-07-08 DIAGNOSIS — S63592A Other specified sprain of left wrist, initial encounter: Secondary | ICD-10-CM | POA: Insufficient documentation

## 2017-07-08 LAB — CREATININE, SERUM
Creatinine, Ser: 0.85 mg/dL (ref 0.44–1.00)
GFR calc Af Amer: >60 mL/min (ref >60)

## 2017-07-08 MED ORDER — GADOBENATE DIMEGLUMINE 529 MG/ML IV SOLN
10.0000 mL | Freq: Once | INTRAVENOUS | Status: AC
  Administered 2017-07-08: 10 mL via INTRAVENOUS

## 2017-07-09 ENCOUNTER — Other Ambulatory Visit: Payer: Self-pay | Admitting: Family Medicine

## 2017-07-09 DIAGNOSIS — S63092A Other subluxation of left wrist and hand, initial encounter: Secondary | ICD-10-CM | POA: Diagnosis not present

## 2017-08-04 ENCOUNTER — Other Ambulatory Visit: Payer: Self-pay | Admitting: Internal Medicine

## 2017-08-13 DIAGNOSIS — S63092A Other subluxation of left wrist and hand, initial encounter: Secondary | ICD-10-CM | POA: Diagnosis not present

## 2017-08-21 DIAGNOSIS — H401111 Primary open-angle glaucoma, right eye, mild stage: Secondary | ICD-10-CM | POA: Diagnosis not present

## 2017-08-21 DIAGNOSIS — H401123 Primary open-angle glaucoma, left eye, severe stage: Secondary | ICD-10-CM | POA: Diagnosis not present

## 2017-09-01 ENCOUNTER — Other Ambulatory Visit: Payer: Self-pay | Admitting: Family Medicine

## 2017-10-19 ENCOUNTER — Encounter: Payer: Self-pay | Admitting: Gastroenterology

## 2017-11-05 ENCOUNTER — Other Ambulatory Visit: Payer: Self-pay | Admitting: Family Medicine

## 2017-11-23 DIAGNOSIS — H401123 Primary open-angle glaucoma, left eye, severe stage: Secondary | ICD-10-CM | POA: Diagnosis not present

## 2017-12-03 ENCOUNTER — Ambulatory Visit (INDEPENDENT_AMBULATORY_CARE_PROVIDER_SITE_OTHER): Payer: Medicare Other | Admitting: Emergency Medicine

## 2017-12-03 DIAGNOSIS — Z23 Encounter for immunization: Secondary | ICD-10-CM | POA: Diagnosis not present

## 2017-12-13 ENCOUNTER — Other Ambulatory Visit: Payer: Self-pay | Admitting: Family Medicine

## 2017-12-14 ENCOUNTER — Encounter: Payer: Medicare Other | Admitting: Gastroenterology

## 2017-12-21 ENCOUNTER — Other Ambulatory Visit: Payer: Self-pay

## 2017-12-21 ENCOUNTER — Encounter: Payer: Self-pay | Admitting: Family Medicine

## 2017-12-21 ENCOUNTER — Other Ambulatory Visit: Payer: Self-pay | Admitting: Family Medicine

## 2017-12-21 ENCOUNTER — Ambulatory Visit: Payer: Medicare Other | Admitting: Family Medicine

## 2017-12-21 VITALS — BP 128/82 | HR 62 | Temp 98.2°F | Resp 16 | Ht 62.0 in | Wt 153.2 lb

## 2017-12-21 DIAGNOSIS — R2231 Localized swelling, mass and lump, right upper limb: Secondary | ICD-10-CM

## 2017-12-21 NOTE — Progress Notes (Signed)
   Subjective:    Patient ID: Denise Ewing, female    DOB: Jan 07, 1946, 72 y.o.   MRN: [id number]  HPI R armpit- noticed yesterday in shower.  TTP.  Doesn't feel warm, minimal redness.  No fevers.  Feeling well.   Review of Systems For ROS see HPI     Objective:   Physical Exam  Constitutional: She is oriented to person, place, and time. She appears well-developed and well-nourished. No distress.  Lymphadenopathy:    She has no cervical adenopathy.    She has axillary adenopathy (R 1.5cm soft tissue mass, TTP).       Left axillary: No pectoral and no lateral adenopathy present.      Right: No supraclavicular adenopathy present.       Left: No supraclavicular adenopathy present.  Neurological: She is alert and oriented to person, place, and time.  Skin: Skin is warm and dry. No erythema.  Vitals reviewed.         Assessment & Plan:  R axillary mass- new.  Area is consistent w/ inflamed LN vs early boil.  Given hx of melanoma will get [place] to assess.  Encouraged warm compresses.  Will follow closely.

## 2017-12-21 NOTE — Patient Instructions (Signed)
We'll notify you of the results and determine the next steps APPLY warm compresses to the area to help w/ pain and swelling Call with any questions or concerns Hang in there!  We'll figure this out!

## 2017-12-25 ENCOUNTER — Ambulatory Visit
Admission: RE | Admit: 2017-12-25 | Discharge: 2017-12-25 | Disposition: A | Discharge status: Home / Self Care | Payer: Medicare Other | Source: Ambulatory Visit | Attending: Family Medicine | Admitting: Family Medicine

## 2017-12-25 DIAGNOSIS — R922 Inconclusive mammogram: Secondary | ICD-10-CM | POA: Diagnosis not present

## 2017-12-25 DIAGNOSIS — R2231 Localized swelling, mass and lump, right upper limb: Secondary | ICD-10-CM

## 2017-12-25 DIAGNOSIS — N6489 Other specified disorders of breast: Secondary | ICD-10-CM | POA: Diagnosis not present

## 2018-01-06 ENCOUNTER — Other Ambulatory Visit: Payer: Self-pay | Admitting: Family Medicine

## 2018-01-20 ENCOUNTER — Ambulatory Visit: Payer: Medicare Other | Admitting: Family Medicine

## 2018-01-25 DIAGNOSIS — Z1231 Encounter for screening mammogram for malignant neoplasm of breast: Secondary | ICD-10-CM | POA: Diagnosis not present

## 2018-01-25 DIAGNOSIS — Z1239 Encounter for other screening for malignant neoplasm of breast: Secondary | ICD-10-CM | POA: Diagnosis not present

## 2018-01-25 LAB — HM MAMMOGRAPHY

## 2018-02-02 ENCOUNTER — Encounter: Payer: Self-pay | Admitting: General Practice

## 2018-02-04 ENCOUNTER — Ambulatory Visit: Payer: Medicare Other | Admitting: Family Medicine

## 2018-02-09 ENCOUNTER — Ambulatory Visit: Payer: Medicare Other | Admitting: Family Medicine

## 2018-02-09 ENCOUNTER — Other Ambulatory Visit: Payer: Self-pay

## 2018-02-09 ENCOUNTER — Encounter: Payer: Self-pay | Admitting: Family Medicine

## 2018-02-09 VITALS — BP 120/81 | HR 97 | Temp 98.1°F | Resp 16 | Ht 62.0 in | Wt 155.0 lb

## 2018-02-09 DIAGNOSIS — E669 Obesity, unspecified: Secondary | ICD-10-CM | POA: Insufficient documentation

## 2018-02-09 DIAGNOSIS — E663 Overweight: Secondary | ICD-10-CM

## 2018-02-09 DIAGNOSIS — E785 Hyperlipidemia, unspecified: Secondary | ICD-10-CM

## 2018-02-09 LAB — CBC WITH DIFFERENTIAL/PLATELET
BASOS PCT: 0.9 % (ref 0.0–3.0)
Basophils Absolute: 0.1 10*3/uL (ref 0.0–0.1)
Eosinophils Absolute: 0.4 10*3/uL (ref 0.0–0.7)
Eosinophils Relative: 7.2 % — ABNORMAL HIGH (ref 0.0–5.0)
HCT: 38.4 % (ref 36.0–46.0)
Hemoglobin: 13 g/dL (ref 12.0–15.0)
LYMPHS ABS: 1.7 10*3/uL (ref 0.7–4.0)
Lymphocytes Relative: 29.7 % (ref 12.0–46.0)
MCHC: 33.7 g/dL (ref 30.0–36.0)
MCV: 94 fl (ref 78.0–100.0)
MONOS PCT: 6.3 % (ref 3.0–12.0)
Monocytes Absolute: 0.4 10*3/uL (ref 0.1–1.0)
NEUTROS ABS: 3.3 10*3/uL (ref 1.4–7.7)
NEUTROS PCT: 55.9 % (ref 43.0–77.0)
PLATELETS: 241 10*3/uL (ref 150.0–400.0)
RBC: 4.09 Mil/uL (ref 3.87–5.11)
RDW: 12.8 % (ref 11.5–15.5)
WBC: 5.8 10*3/uL (ref 4.0–10.5)

## 2018-02-09 LAB — BASIC METABOLIC PANEL
BUN: 21 mg/dL (ref 6–23)
CHLORIDE: 103 meq/L (ref 96–112)
CO2: 26 mEq/L (ref 19–32)
Calcium: 9.5 mg/dL (ref 8.4–10.5)
Creatinine, Ser: 0.85 mg/dL (ref 0.40–1.20)
GFR: 69.65 mL/min (ref >60.00)
Glucose, Bld: 128 mg/dL — ABNORMAL HIGH (ref 70–99)
Potassium: 3.9 mEq/L (ref 3.5–5.1)
Sodium: 138 mEq/L (ref 135–145)

## 2018-02-09 LAB — HEPATIC FUNCTION PANEL
ALK PHOS: 58 U/L (ref 39–117)
ALT: 18 U/L (ref 0–35)
AST: 24 U/L (ref 0–37)
Albumin: 4.4 g/dL (ref 3.5–5.2)
BILIRUBIN DIRECT: 0.1 mg/dL (ref 0.0–0.3)
Total Bilirubin: 0.6 mg/dL (ref 0.2–1.2)
Total Protein: 6.8 g/dL (ref 6.0–8.3)

## 2018-02-09 LAB — LIPID PANEL
CHOL/HDL RATIO: 2
Cholesterol: 115 mg/dL (ref 0–200)
HDL: 53.5 mg/dL (ref >39.00)
LDL Cholesterol: 44 mg/dL (ref 0–99)
NONHDL: 61.78
TRIGLYCERIDES: 89 mg/dL (ref 0.0–149.0)
VLDL: 17.8 mg/dL (ref 0.0–40.0)

## 2018-02-09 LAB — TSH: TSH: 3.06 u[IU]/mL (ref 0.35–4.50)

## 2018-02-09 NOTE — Assessment & Plan Note (Signed)
New.  Reviewed need for pt to eat periodically throughout the day to avoid extreme hunger and overeating at meals.  Stressed need for regular exercise.  Check labs to risk stratify.  Will follow.

## 2018-02-09 NOTE — Assessment & Plan Note (Signed)
Chronic problem.  Tolerating statin w/o difficulty.  Check labs.  Adjust meds prn  

## 2018-02-09 NOTE — Patient Instructions (Signed)
Schedule your complete physical in 6 months We'll notify you of your lab results and make any changes if needed Start grazing throughout the day to avoid extreme hunger and overeating at meals Continue to exercise Call with any questions or concerns Happy Holidays!!!

## 2018-02-09 NOTE — Progress Notes (Signed)
   Subjective:    Patient ID: Denise Ewing, female    DOB: 1945-10-13, 72 y.o.   MRN: [id number]  HPI Hyperlipidemia- chronic problem.  On Lipitor 10mg  daily.  Pt is concerned about weight gain but this has only been 3 lbs in 6 months.  No CP, SOB, abd pain, N/V  Overweight- pt reports by the time meals roll around she is 'starving'.  Pt notes that portions have increased b/c by the time she eats she is so hungry.  Pt is walking dogs twice daily but wants to increase her exercise level.   Review of Systems For ROS see HPI     Objective:   Physical Exam Vitals signs reviewed.  Constitutional:      General: She is not in acute distress.    Appearance: She is well-developed.  HENT:     Head: Normocephalic and atraumatic.  Eyes:     Conjunctiva/sclera: Conjunctivae normal.     Pupils: Pupils are equal, round, and reactive to light.  Neck:     Musculoskeletal: Normal range of motion and neck supple.     Thyroid: No thyromegaly.  Cardiovascular:     Rate and Rhythm: Normal rate and regular rhythm.     Heart sounds: Normal heart sounds. No murmur.  Pulmonary:     Effort: Pulmonary effort is normal. No respiratory distress.     Breath sounds: Normal breath sounds.  Abdominal:     General: There is no distension.     Palpations: Abdomen is soft.     Tenderness: There is no abdominal tenderness.  Lymphadenopathy:     Cervical: No cervical adenopathy.  Skin:    General: Skin is warm and dry.  Neurological:     Mental Status: She is alert and oriented to person, place, and time.  Psychiatric:        Behavior: Behavior normal.           Assessment & Plan:

## 2018-02-10 ENCOUNTER — Encounter: Payer: Self-pay | Admitting: General Practice

## 2018-02-21 LAB — FECAL OCCULT BLOOD, IMMUNOCHEMICAL: IMMUNOLOGICAL FECAL OCCULT BLOOD TEST: NEGATIVE

## 2018-02-26 ENCOUNTER — Other Ambulatory Visit: Payer: Self-pay

## 2018-02-26 MED ORDER — RALOXIFENE HCL 60 MG PO TABS: 60.0000 mg | ORAL_TABLET | Freq: Every day | ORAL | 1 refills | Status: DC

## 2018-03-09 ENCOUNTER — Other Ambulatory Visit: Payer: Self-pay | Admitting: Family Medicine

## 2018-03-11 ENCOUNTER — Encounter: Payer: Self-pay | Admitting: General Practice

## 2018-03-17 DIAGNOSIS — Z85820 Personal history of malignant melanoma of skin: Secondary | ICD-10-CM | POA: Diagnosis not present

## 2018-03-17 DIAGNOSIS — L820 Inflamed seborrheic keratosis: Secondary | ICD-10-CM | POA: Diagnosis not present

## 2018-03-17 DIAGNOSIS — D485 Neoplasm of uncertain behavior of skin: Secondary | ICD-10-CM | POA: Diagnosis not present

## 2018-03-17 DIAGNOSIS — L821 Other seborrheic keratosis: Secondary | ICD-10-CM | POA: Diagnosis not present

## 2018-04-08 ENCOUNTER — Other Ambulatory Visit: Payer: Self-pay | Admitting: Family Medicine

## 2018-04-30 ENCOUNTER — Other Ambulatory Visit: Payer: Self-pay | Admitting: Internal Medicine

## 2018-05-07 ENCOUNTER — Other Ambulatory Visit: Payer: Self-pay | Admitting: Family Medicine

## 2018-05-09 ENCOUNTER — Other Ambulatory Visit: Payer: Self-pay | Admitting: Internal Medicine

## 2018-06-02 ENCOUNTER — Other Ambulatory Visit: Payer: Self-pay | Admitting: Family Medicine

## 2018-06-04 ENCOUNTER — Other Ambulatory Visit: Payer: Self-pay | Admitting: Internal Medicine

## 2018-06-12 ENCOUNTER — Other Ambulatory Visit: Payer: Self-pay | Admitting: Family Medicine

## 2018-07-02 DIAGNOSIS — H401111 Primary open-angle glaucoma, right eye, mild stage: Secondary | ICD-10-CM | POA: Diagnosis not present

## 2018-07-02 DIAGNOSIS — H401123 Primary open-angle glaucoma, left eye, severe stage: Secondary | ICD-10-CM | POA: Diagnosis not present

## 2018-07-02 DIAGNOSIS — H04123 Dry eye syndrome of bilateral lacrimal glands: Secondary | ICD-10-CM | POA: Diagnosis not present

## 2018-07-02 DIAGNOSIS — H25012 Cortical age-related cataract, left eye: Secondary | ICD-10-CM | POA: Diagnosis not present

## 2018-07-05 DIAGNOSIS — M67911 Unspecified disorder of synovium and tendon, right shoulder: Secondary | ICD-10-CM | POA: Diagnosis not present

## 2018-07-08 ENCOUNTER — Other Ambulatory Visit: Payer: Self-pay | Admitting: Family Medicine

## 2018-07-28 ENCOUNTER — Encounter: Payer: Self-pay | Admitting: Family Medicine

## 2018-07-28 ENCOUNTER — Other Ambulatory Visit: Payer: Self-pay

## 2018-07-28 ENCOUNTER — Encounter: Payer: Self-pay | Admitting: Physician Assistant

## 2018-07-28 ENCOUNTER — Ambulatory Visit (INDEPENDENT_AMBULATORY_CARE_PROVIDER_SITE_OTHER): Payer: Medicare Other | Admitting: Physician Assistant

## 2018-07-28 ENCOUNTER — Telehealth: Payer: Self-pay | Admitting: Family Medicine

## 2018-07-28 VITALS — Temp 99.2°F | Wt 150.0 lb

## 2018-07-28 DIAGNOSIS — S70362A Insect bite (nonvenomous), left thigh, initial encounter: Secondary | ICD-10-CM | POA: Diagnosis not present

## 2018-07-28 DIAGNOSIS — W57XXXA Bitten or stung by nonvenomous insect and other nonvenomous arthropods, initial encounter: Secondary | ICD-10-CM | POA: Diagnosis not present

## 2018-07-28 MED ORDER — DOXYCYCLINE HYCLATE 100 MG PO TABS: 100.0000 mg | ORAL_TABLET | Freq: Two times a day (BID) | ORAL | 0 refills | Status: DC

## 2018-07-28 NOTE — Telephone Encounter (Signed)
I saw patient this afternoon.

## 2018-07-28 NOTE — Progress Notes (Signed)
Virtual Visit via Video   I connected with patient on 07/28/18 at  2:20 PM EDT by a video enabled telemedicine application and verified that I am speaking with the correct person using two identifiers.  Location patient: Home Location provider: [facility] Persons participating in the virtual visit: Patient, Provider, [place] (Patina Moore)  I discussed the limitations of evaluation and management by telemedicine and the availability of in person appointments. The patient expressed understanding and agreed to proceed.  Subjective:   HPI:   Patient presents today via doxy.me with concerns of potentially infected tick bite.  Patient endorses finding a tick on her left posterior thigh this morning.  States it was not there before bed last night.  Feels she must depicted up this morning while taking the dogs out to use the restroom.  Patient states her husband remove the entirety of the tick but threw it away.  Did note it was a very small deer tick.  Patient denies fever or chills.  Notes increasing redness with initial itching to the area.  Now notes skin is more tender and hard.  ROS:   See pertinent positives and negatives per HPI.  Patient Active Problem List   Diagnosis Date Noted  . Overweight (BMI 25.0-29.9) 02/09/2018  . Numbness and tingling 08/05/2016  . Osteoporosis 04/18/2014  . Hyperlipidemia 04/18/2014  . CAD (coronary artery disease), native coronary artery 01/11/2014  . Chest pain 01/05/2014  . Pain in the chest   . Acute facial pain 03/23/2013  . Hand arthritis 03/23/2013  . Eczema 02/08/2013  . UTI (urinary tract infection) 07/09/2012  . Routine general medical examination at a health care facility 02/06/2012  . Lumbar radicular pain 09/05/2011  . Fatigue 12/23/2010  . Neuropathy of foot 11/04/2010  . GERD 12/05/2009  . DYSPAREUNIA 12/05/2009  . SKIN CANCER, HX OF 12/05/2009  . COLONIC POLYPS, HX OF 12/05/2009    Social History   Tobacco Use   . Smoking status: Never Smoker  . Smokeless tobacco: Never Used  Substance Use Topics  . Alcohol use: Yes    Comment:  occasionally    Current Outpatient Medications:  .  aspirin 81 MG tablet, Take 81 mg by mouth daily., Disp: , Rfl:  .  atorvastatin (LIPITOR) 10 MG tablet, Take 1 tablet (10 mg total) by mouth daily., Disp: 90 tablet, Rfl: 0 .  Calcium Citrate-Vitamin D (CITRACAL/VITAMIN D) 250-200 MG-UNIT TABS, Take 2 tablets by mouth at bedtime. , Disp: , Rfl:  .  Coenzyme Q10 (CO Q-10 PO), Take 1 capsule by mouth every morning., Disp: , Rfl:  .  conjugated estrogens (PREMARIN) vaginal cream, Place 1.5 g vaginally every Friday. , Disp: , Rfl:  .  ezetimibe (ZETIA) 10 MG tablet, TAKE 1/2 (ONE-HALF) TABLET BY MOUTH ONCE DAILY, need office visit 1st attempt, Disp: 45 tablet, Rfl: 0 .  GLUCOSAMINE CHONDROITIN COMPLX PO, Take 2 tablets by mouth every morning. , Disp: , Rfl:  .  meloxicam (MOBIC) 15 MG tablet, Take 1 tablet by mouth once daily, Disp: 30 tablet, Rfl: 0 .  Multiple Vitamins-Minerals (ALIVE WOMENS 50+ PO), Take 1 tablet by mouth every morning., Disp: , Rfl:  .  OVER THE COUNTER MEDICATION, Take 1 tablet by mouth 2 (two) times daily. Tumeric and cumin, Disp: , Rfl:  .  pantoprazole (PROTONIX) 40 MG tablet, Take 1 tablet by mouth once daily, Disp: 90 tablet, Rfl: 0 .  raloxifene (EVISTA) 60 MG tablet, Take 1 tablet (60 mg  total) by mouth daily., Disp: 90 tablet, Rfl: 1 .  timolol (BETIMOL) 0.5 % ophthalmic solution, Place 1 drop into the left eye 2 (two) times daily., Disp: , Rfl:  .  TRAVATAN Z 0.004 % SOLN ophthalmic solution, , Disp: , Rfl: 1  Allergies  Allergen Reactions  . Amoxicillin Hives    Objective:   Temp 99.2 F (37.3 C) (Oral)   Wt 150 lb (68 kg)   BMI 27.44 kg/m   Patient is well-developed, well-nourished in no acute distress.  Resting comfortably at home.  Head is normocephalic, atraumatic.  No labored breathing.  Speech is clear and coherent with  logical contest.  Patient is alert and oriented at baseline.  Area of concern is in lateral left popliteal region. Note site of tick bite without sign of residual tick. There is a 4-5 cm area of redness with tenderness to palpation per patient. She notes induration in the area.  Assessment and Plan:   1. Tick bite, initial encounter With potential mild infection.  Little concern for tickborne illness as the tick was attached for less than 5 hours and has been completely removed.  Reviewed skin care measures with patient.  Cold compresses recommended.  Will start doxycycline.  Strict return precautions reviewed with patient.  Patient voiced understanding and agreement with plan.    Leeanne Rio, PA-C 07/28/2018

## 2018-07-28 NOTE — Patient Instructions (Signed)
Please take the antibiotic as directed. Keep skin clean and dry. Cold compresses to help with inflammation in the area.  Let me know if there are any worsening symptoms after the next 48 hours.  Take care!   Tick Bite Information, Adult  Ticks are insects that can bite. Most ticks live in shrubs and grassy areas. They climb onto people and animals that go by. Then they bite. Some ticks carry germs that can make you sick. How can I prevent tick bites?  Use an insect repellent that has 20% or higher of the ingredients DEET, picaridin, or IR3535. Put this insect repellent on: ? Bare skin. ? The tops of your boots. ? Your pant legs. ? The ends of your sleeves.  If you use an insect repellent that has the ingredient permethrin, make sure to follow the instructions on the bottle. Treat the following: ? Clothing. ? Supplies. ? Boots. ? Tents.  Wear long sleeves, long pants, and light colors.  Tuck your pant legs into your socks.  Stay in the middle of the trail.  Try not to walk through long grass.  Before going inside your house, check your clothes, hair, and skin for ticks. Make sure to check your head, neck, armpits, waist, groin, and joint areas.  Check for ticks every day.  When you come indoors: ? Wash your clothes right away. ? Shower right away. ? Dry your clothes in a dryer on high heat for 60 minutes or more. What is the right way to remove a tick? Remove a tick from your skin as soon as possible.  To remove a tick that is crawling on your skin: ? Go outdoors and brush the tick off. ? Use tape or a lint roller.  To remove a tick that is biting: ? Wash your hands. ? If you have latex gloves, put them on. ? Use tweezers, curved forceps, or a tick-removal tool to grasp the tick. Grasp the tick as close to your skin and as close to the tick's head as possible. ? Gently pull up until the tick lets go.  Try to keep the tick's head attached to its body.  Do not  twist or jerk the tick.  Do not squeeze or crush the tick. Do not try to remove a tick with heat, alcohol, petroleum jelly, or fingernail polish. How should I get rid of a tick? Here are some ways to get rid of a tick that is alive:  Place the tick in rubbing alcohol.  Place the tick in a bag or container you can close tightly.  Wrap the tick tightly in tape.  Flush the tick down the toilet. Contact a doctor if:  You have symptoms of a disease, such as: ? Pain in a muscle, joint, or bone. ? Trouble walking or moving your legs. ? Numbness in your legs. ? Inability to move (paralysis). ? A red rash that makes a circle (bull's-eye rash). ? Redness and swelling where the tick bit you. ? A fever. ? Throwing up (vomiting) over and over. ? Diarrhea. ? Weight loss. ? Tender and swollen lymph glands. ? Shortness of breath. ? Cough. ? Belly pain (abdominal pain). ? Headache. ? Being more tired than normal. ? A change in how alert (conscious) you are. ? Confusion. Get help right away if:  You cannot remove a tick.  A part of a tick breaks off and gets stuck in your skin.  You are feeling worse. Summary  Ticks may  carry germs that can make you sick.  To prevent tick bites, wear long sleeves, long pants, and light colors. Use insect repellent. Follow the instructions on the bottle.  If the tick is biting, do not try to remove it with heat, alcohol, petroleum jelly, or fingernail polish.  Use tweezers, curved forceps, or a tick-removal tool to grasp the tick. Gently pull up until the tick lets go. Do not twist or jerk the tick. Do not squeeze or crush the tick.  If you have symptoms, contact a doctor. This information is not intended to replace advice given to you by your health care provider. Make sure you discuss any questions you have with your health care provider. Document Released: 05/07/2009 Document Revised: 05/23/2016 Document Reviewed: 05/23/2016 Elsevier  Interactive Patient Education  2019 [employer].

## 2018-07-28 NOTE — Telephone Encounter (Signed)
Pt states that she found a tick on her leg this morning, tick has been removed (thinking that it may have been a deer tick) pt states that area is very red and hot to touch. Pt states that she will send a picture through her MyChart and asking if she is needing and appt to come in to get something in case tick was carrying lyme disease. Please advise.

## 2018-07-28 NOTE — Telephone Encounter (Signed)
This can be a virtual visit for evaluation first.

## 2018-07-28 NOTE — Progress Notes (Signed)
I have discussed the procedure for the virtual visit with the patient who has given consent to proceed with assessment and treatment.   Courtni Balash S Glenn Gullickson, CMA     

## 2018-07-29 ENCOUNTER — Encounter: Payer: Self-pay | Admitting: Physician Assistant

## 2018-08-02 DIAGNOSIS — M67911 Unspecified disorder of synovium and tendon, right shoulder: Secondary | ICD-10-CM | POA: Diagnosis not present

## 2018-08-03 ENCOUNTER — Other Ambulatory Visit: Payer: Self-pay | Admitting: Internal Medicine

## 2018-08-03 ENCOUNTER — Other Ambulatory Visit: Payer: Self-pay | Admitting: Family Medicine

## 2018-08-04 DIAGNOSIS — H02052 Trichiasis without entropian right lower eyelid: Secondary | ICD-10-CM | POA: Diagnosis not present

## 2018-08-10 DIAGNOSIS — M25511 Pain in right shoulder: Secondary | ICD-10-CM | POA: Diagnosis not present

## 2018-08-11 ENCOUNTER — Encounter: Payer: Self-pay | Admitting: General Practice

## 2018-08-11 ENCOUNTER — Encounter: Payer: Self-pay | Admitting: Family Medicine

## 2018-08-11 ENCOUNTER — Other Ambulatory Visit: Payer: Self-pay

## 2018-08-11 ENCOUNTER — Ambulatory Visit (INDEPENDENT_AMBULATORY_CARE_PROVIDER_SITE_OTHER): Payer: Medicare Other | Admitting: Family Medicine

## 2018-08-11 VITALS — BP 120/78 | HR 80 | Temp 97.9°F | Resp 16 | Ht 62.0 in | Wt 154.0 lb

## 2018-08-11 DIAGNOSIS — M818 Other osteoporosis without current pathological fracture: Secondary | ICD-10-CM | POA: Diagnosis not present

## 2018-08-11 DIAGNOSIS — Z0000 Encounter for general adult medical examination without abnormal findings: Secondary | ICD-10-CM

## 2018-08-11 DIAGNOSIS — E785 Hyperlipidemia, unspecified: Secondary | ICD-10-CM | POA: Diagnosis not present

## 2018-08-11 DIAGNOSIS — E663 Overweight: Secondary | ICD-10-CM

## 2018-08-11 LAB — BASIC METABOLIC PANEL
BUN: 19 mg/dL (ref 6–23)
CO2: 31 mEq/L (ref 19–32)
Calcium: 9.4 mg/dL (ref 8.4–10.5)
Chloride: 103 mEq/L (ref 96–112)
Creatinine, Ser: 0.91 mg/dL (ref 0.40–1.20)
GFR: 60.49 mL/min (ref >60.00)
Glucose, Bld: 102 mg/dL — ABNORMAL HIGH (ref 70–99)
Potassium: 5 mEq/L (ref 3.5–5.1)
Sodium: 139 mEq/L (ref 135–145)

## 2018-08-11 LAB — HEPATIC FUNCTION PANEL
ALT: 16 U/L (ref 0–35)
AST: 21 U/L (ref 0–37)
Albumin: 4.2 g/dL (ref 3.5–5.2)
Alkaline Phosphatase: 54 U/L (ref 39–117)
Bilirubin, Direct: 0.1 mg/dL (ref 0.0–0.3)
Total Bilirubin: 0.5 mg/dL (ref 0.2–1.2)
Total Protein: 6.2 g/dL (ref 6.0–8.3)

## 2018-08-11 LAB — CBC WITH DIFFERENTIAL/PLATELET
Basophils Absolute: 0.1 10*3/uL (ref 0.0–0.1)
Basophils Relative: 0.8 % (ref 0.0–3.0)
Eosinophils Absolute: 0.3 10*3/uL (ref 0.0–0.7)
Eosinophils Relative: 5.3 % — ABNORMAL HIGH (ref 0.0–5.0)
HCT: 38.6 % (ref 36.0–46.0)
Hemoglobin: 12.9 g/dL (ref 12.0–15.0)
Lymphocytes Relative: 29.3 % (ref 12.0–46.0)
Lymphs Abs: 1.8 10*3/uL (ref 0.7–4.0)
MCHC: 33.5 g/dL (ref 30.0–36.0)
MCV: 94.9 fl (ref 78.0–100.0)
Monocytes Absolute: 0.5 10*3/uL (ref 0.1–1.0)
Monocytes Relative: 8.4 % (ref 3.0–12.0)
Neutro Abs: 3.4 10*3/uL (ref 1.4–7.7)
Neutrophils Relative %: 56.2 % (ref 43.0–77.0)
Platelets: 240 10*3/uL (ref 150.0–400.0)
RBC: 4.07 Mil/uL (ref 3.87–5.11)
RDW: 13.2 % (ref 11.5–15.5)
WBC: 6 10*3/uL (ref 4.0–10.5)

## 2018-08-11 LAB — TSH: TSH: 2.49 u[IU]/mL (ref 0.35–4.50)

## 2018-08-11 LAB — LIPID PANEL
Cholesterol: 120 mg/dL (ref 0–200)
HDL: 53.6 mg/dL (ref >39.00)
LDL Cholesterol: 52 mg/dL (ref 0–99)
NonHDL: 66.28
Total CHOL/HDL Ratio: 2
Triglycerides: 70 mg/dL (ref 0.0–149.0)
VLDL: 14 mg/dL (ref 0.0–40.0)

## 2018-08-11 LAB — VITAMIN D 25 HYDROXY (VIT D DEFICIENCY, FRACTURES): VITD: 58.36 ng/mL (ref 30.00–100.00)

## 2018-08-11 NOTE — Progress Notes (Signed)
   Subjective:    Patient ID: Denise Ewing, female    DOB: June 23, 1945, 73 y.o.   MRN: [id number]  HPI CPE- UTD on immunizations, mammo.  No need for pap.  Due for repeat colonoscopy- pt planned to do at some point this year.  Did iFOB December 2019- negative.   Review of Systems Patient reports no vision/ hearing changes, adenopathy,fever, weight change,  persistant/recurrent hoarseness , swallowing issues, chest pain, palpitations, edema, persistant/recurrent cough, hemoptysis, dyspnea (rest/exertional/paroxysmal nocturnal), gastrointestinal bleeding (melena, rectal bleeding), abdominal pain, significant heartburn, bowel changes, GU symptoms (dysuria, hematuria, incontinence), Gyn symptoms (abnormal  bleeding, pain),  syncope, focal weakness, memory loss, numbness & tingling, skin/hair/nail changes, abnormal bruising or bleeding, anxiety, or depression.     Objective:   Physical Exam General Appearance:    Alert, cooperative, no distress, appears stated age  Head:    Normocephalic, without obvious abnormality, atraumatic  Eyes:    PERRL, conjunctiva/corneas clear, EOM's intact, fundi    benign, both eyes  Ears:    Normal TM's and external ear canals, both ears  Nose:   Nares normal, septum midline, mucosa normal, no drainage    or sinus tenderness  Throat:   Lips, mucosa, and tongue normal; teeth and gums normal  Neck:   Supple, symmetrical, trachea midline, no adenopathy;    Thyroid: no enlargement/tenderness/nodules  Back:     Symmetric, no curvature, ROM normal, no CVA tenderness  Lungs:     Clear to auscultation bilaterally, respirations unlabored  Chest Wall:    No tenderness or deformity   Heart:    Regular rate and rhythm, S1 and S2 normal, no murmur, rub   or gallop  Breast Exam:    Deferred to mammo  Abdomen:     Soft, non-tender, bowel sounds active all four quadrants,    no masses, no organomegaly  Genitalia:    Deferred  Rectal:    Extremities:   Extremities normal,  atraumatic, no cyanosis or edema  Pulses:   2+ and symmetric all extremities  Skin:   Skin color, texture, turgor normal, no rashes or lesions  Lymph nodes:   Cervical, supraclavicular, and axillary nodes normal  Neurologic:   CNII-XII intact, normal strength, sensation and reflexes    throughout          Assessment & Plan:

## 2018-08-11 NOTE — Patient Instructions (Signed)
Follow up in 6 months to recheck cholesterol We'll notify you of your lab results and make any changes if needed Keep up the good work on healthy diet and regular exercise- you can do it! Call with any questions or concerns Stay Safe!!!

## 2018-08-11 NOTE — Assessment & Plan Note (Signed)
Ongoing issue.  Stressed need for healthy diet and regular exercise.  Check labs to risk stratify.

## 2018-08-11 NOTE — Assessment & Plan Note (Signed)
Pt's PE WNL.  UTD on mammo.  iFOB complete.  UTD on immunizations.  Check labs.  Anticipatory guidance provided.

## 2018-08-11 NOTE — Assessment & Plan Note (Signed)
Check Vit D and replete prn. 

## 2018-08-11 NOTE — Assessment & Plan Note (Signed)
Chronic problem.  Tolerating statin w/o difficulty.  Check labs.  Adjust meds prn  

## 2018-08-13 DIAGNOSIS — M67911 Unspecified disorder of synovium and tendon, right shoulder: Secondary | ICD-10-CM | POA: Diagnosis not present

## 2018-08-25 ENCOUNTER — Other Ambulatory Visit: Payer: Self-pay | Admitting: Family Medicine

## 2018-08-30 ENCOUNTER — Other Ambulatory Visit: Payer: Self-pay | Admitting: Internal Medicine

## 2018-09-03 ENCOUNTER — Other Ambulatory Visit: Payer: Self-pay | Admitting: Family Medicine

## 2018-09-10 ENCOUNTER — Other Ambulatory Visit: Payer: Self-pay | Admitting: Family Medicine

## 2018-09-10 DIAGNOSIS — M24811 Other specific joint derangements of right shoulder, not elsewhere classified: Secondary | ICD-10-CM | POA: Diagnosis not present

## 2018-10-05 ENCOUNTER — Other Ambulatory Visit: Payer: Self-pay | Admitting: Family Medicine

## 2018-10-21 ENCOUNTER — Other Ambulatory Visit: Payer: Self-pay | Admitting: Internal Medicine

## 2018-11-03 ENCOUNTER — Other Ambulatory Visit: Payer: Self-pay | Admitting: Family Medicine

## 2018-11-17 DIAGNOSIS — H401111 Primary open-angle glaucoma, right eye, mild stage: Secondary | ICD-10-CM | POA: Diagnosis not present

## 2018-11-17 DIAGNOSIS — H0100A Unspecified blepharitis right eye, upper and lower eyelids: Secondary | ICD-10-CM | POA: Diagnosis not present

## 2018-11-17 DIAGNOSIS — H401123 Primary open-angle glaucoma, left eye, severe stage: Secondary | ICD-10-CM | POA: Diagnosis not present

## 2018-11-17 DIAGNOSIS — H04123 Dry eye syndrome of bilateral lacrimal glands: Secondary | ICD-10-CM | POA: Diagnosis not present

## 2018-11-20 ENCOUNTER — Encounter: Payer: Self-pay | Admitting: Family Medicine

## 2018-11-22 ENCOUNTER — Other Ambulatory Visit: Payer: Self-pay | Admitting: Family Medicine

## 2018-12-04 ENCOUNTER — Other Ambulatory Visit: Payer: Self-pay | Admitting: Internal Medicine

## 2018-12-04 ENCOUNTER — Other Ambulatory Visit: Payer: Self-pay | Admitting: Family Medicine

## 2018-12-06 ENCOUNTER — Other Ambulatory Visit: Payer: Self-pay | Admitting: Family Medicine

## 2018-12-09 ENCOUNTER — Other Ambulatory Visit: Payer: Self-pay | Admitting: Internal Medicine

## 2018-12-16 ENCOUNTER — Emergency Department (HOSPITAL_COMMUNITY): Payer: Medicare Other

## 2018-12-16 ENCOUNTER — Telehealth: Payer: Self-pay | Admitting: Internal Medicine

## 2018-12-16 ENCOUNTER — Emergency Department (HOSPITAL_COMMUNITY)
Admission: EM | Admit: 2018-12-16 | Discharge: 2018-12-16 | Disposition: A | Discharge status: Home / Self Care | Payer: Medicare Other | Attending: Emergency Medicine | Admitting: Emergency Medicine

## 2018-12-16 DIAGNOSIS — I2510 Atherosclerotic heart disease of native coronary artery without angina pectoris: Secondary | ICD-10-CM | POA: Diagnosis not present

## 2018-12-16 DIAGNOSIS — R0789 Other chest pain: Secondary | ICD-10-CM | POA: Diagnosis present

## 2018-12-16 DIAGNOSIS — Z7982 Long term (current) use of aspirin: Secondary | ICD-10-CM | POA: Insufficient documentation

## 2018-12-16 DIAGNOSIS — Z79899 Other long term (current) drug therapy: Secondary | ICD-10-CM | POA: Insufficient documentation

## 2018-12-16 DIAGNOSIS — R072 Precordial pain: Secondary | ICD-10-CM | POA: Diagnosis not present

## 2018-12-16 DIAGNOSIS — R079 Chest pain, unspecified: Secondary | ICD-10-CM | POA: Diagnosis not present

## 2018-12-16 LAB — CBC
HCT: 40 % (ref 36.0–46.0)
Hemoglobin: 13 g/dL (ref 12.0–15.0)
MCH: 31.3 pg (ref 26.0–34.0)
MCHC: 32.5 g/dL (ref 30.0–36.0)
MCV: 96.4 fL (ref 80.0–100.0)
Platelets: 254 10*3/uL (ref 150–400)
RBC: 4.15 MIL/uL (ref 3.87–5.11)
RDW: 11.9 % (ref 11.5–15.5)
WBC: 6.3 10*3/uL (ref 4.0–10.5)
nRBC: 0 % (ref 0.0–0.2)

## 2018-12-16 LAB — BASIC METABOLIC PANEL
Anion gap: 10 (ref 5–15)
BUN: 19 mg/dL (ref 8–23)
CO2: 25 mmol/L (ref 22–32)
Calcium: 9.5 mg/dL (ref 8.9–10.3)
Chloride: 102 mmol/L (ref 98–111)
Creatinine, Ser: 0.81 mg/dL (ref 0.44–1.00)
GFR calc Af Amer: >60 mL/min (ref >60)
GFR calc non Af Amer: >60 mL/min (ref >60)
Glucose, Bld: 98 mg/dL (ref 70–99)
Potassium: 4.4 mmol/L (ref 3.5–5.1)
Sodium: 137 mmol/L (ref 135–145)

## 2018-12-16 LAB — TROPONIN I (HIGH SENSITIVITY)
Troponin I (High Sensitivity): 3 ng/L (ref <18)
Troponin I (High Sensitivity): 5 ng/L (ref <18)

## 2018-12-16 MED ORDER — SODIUM CHLORIDE 0.9% FLUSH: 3.0000 mL | Freq: Once | INTRAVENOUS | Status: DC

## 2018-12-16 MED ORDER — SUCRALFATE 1 G PO TABS: 1.0000 g | ORAL_TABLET | Freq: Four times a day (QID) | ORAL | 0 refills | Status: DC

## 2018-12-16 MED ORDER — NITROGLYCERIN 2 % TD OINT
1.0000 [in_us] | TOPICAL_OINTMENT | Freq: Four times a day (QID) | TRANSDERMAL | Status: DC
  Filled 2018-12-16 (×2): qty 1

## 2018-12-16 MED ORDER — ASPIRIN 81 MG PO CHEW
324.0000 mg | CHEWABLE_TABLET | Freq: Once | ORAL | Status: AC
  Administered 2018-12-16: 13:00:00 324 mg via ORAL
  Filled 2018-12-16: qty 4

## 2018-12-16 NOTE — ED Notes (Signed)
Pt refused nitroglycerin. Pt states "her chest pain has subsided and that she does not want the nitroglycerin because she knows it can drop her blood pressure and she does not want to worry about that right now".

## 2018-12-16 NOTE — ED Notes (Signed)
Patient verbalizes understanding of discharge instructions. Opportunity for questioning and answers were provided. Pt discharged from ED. 

## 2018-12-16 NOTE — ED Triage Notes (Addendum)
Chest pain starting this morning- pressure, that is intermittent. When came on a second time, she got dizziness and nausea. She does have a blockage and sees Dr. Harrington Challenger for cards.

## 2018-12-16 NOTE — Telephone Encounter (Signed)
I will route to Dr. Alan Ripper nurse as Juluis Rainier.

## 2018-12-16 NOTE — Discharge Instructions (Addendum)
Follow-up with Dr. Harrington Challenger as we discussed.  Return to the ED as needed for worsening symptoms.  Start taking the Carafate in addition to your other medications.

## 2018-12-16 NOTE — ED Provider Notes (Signed)
[place] EMERGENCY DEPARTMENT Provider Note   CSN: [id number] Arrival date & time: 12/16/18  1017     History   Chief Complaint Chief Complaint  Patient presents with  . Chest Pain    HPI Denise Ewing is a 73 y.o. female.  HPI: A 73 year old patient presents for evaluation of chest pain. Initial onset of pain was less than one hour ago. The patient's chest pain is described as heaviness/pressure/tightness and is not worse with exertion. The patient complains of nausea. The patient's chest pain is middle- or left-sided, is not well-localized, is not sharp and does not radiate to the arms/jaw/neck. The patient denies diaphoresis. The patient has no history of stroke, has no history of peripheral artery disease, has not smoked in the past 90 days, denies any history of treated diabetes, has no relevant family history of coronary artery disease (first degree relative at less than age 106), is not hypertensive, has no history of hypercholesterolemia and does not have an elevated BMI (>=30).   HPI Pt started having pressure in her chest at 0915.  After about 10 minutes it resolved.  At [place] it occurred again.  She felt nauseated and lightheaded when it occurred.  Since she has been here she will have it come and go.  It is mild at this time.  Patient does not have any history of MI but she did have chest pain previously and had a cardiac CT that did show mild amount of coronary artery disease. Past Medical History:  Diagnosis Date  . GERD (gastroesophageal reflux disease)   . Glaucoma   . History of colonic polyps   . Melanoma of thigh [facility])     Patient Active Problem List   Diagnosis Date Noted  . Overweight (BMI 25.0-29.9) 02/09/2018  . Numbness and tingling 08/05/2016  . Osteoporosis 04/18/2014  . Hyperlipidemia 04/18/2014  . CAD (coronary artery disease), native coronary artery 01/11/2014  . Chest pain 01/05/2014  . Pain in the chest   . Hand arthritis  03/23/2013  . Eczema 02/08/2013  . Routine general medical examination at a health care facility 02/06/2012  . Lumbar radicular pain 09/05/2011  . Fatigue 12/23/2010  . Neuropathy of foot 11/04/2010  . GERD 12/05/2009  . DYSPAREUNIA 12/05/2009  . SKIN CANCER, HX OF 12/05/2009  . COLONIC POLYPS, HX OF 12/05/2009    Past Surgical History:  Procedure Laterality Date  . Colon polyps removed    . Melanoma removed     R thigh  . TOTAL ABDOMINAL HYSTERECTOMY W/ BILATERAL SALPINGOOPHORECTOMY     uterine fibroids     OB History   No obstetric history on file.      Home Medications    Prior to Admission medications   Medication Sig Start Date End Date Taking? Authorizing Provider  aspirin 81 MG tablet Take 81 mg by mouth daily.   Yes [provider]  atorvastatin (LIPITOR) 10 MG tablet Take 1 tablet (10 mg total) by mouth daily. Please make overdue appt with Dr. Harrington Challenger before anymore refills. 2nd attempt 12/06/18  Yes Fay Records, MD  Calcium Citrate-Vitamin D (CITRACAL/VITAMIN D) 250-200 MG-UNIT TABS Take 2 tablets by mouth at bedtime.    Yes [provider]  Coenzyme Q10 (CO Q-10 PO) Take 1 capsule by mouth every morning.   Yes [provider]  conjugated estrogens (PREMARIN) vaginal cream Place 1.5 g vaginally once a week. Take one tablet by mouth on Sundays per patient  Yes [provider]  ezetimibe (ZETIA) 10 MG tablet Take 0.5 tablets (5 mg total) by mouth daily. TAKE 1/2 (ONE-HALF) TABLET BY MOUTH ONCE DAILY . APPOINTMENT REQUIRED FOR FUTURE REFILLS Patient taking differently: Take 5 mg by mouth daily.  10/21/18  Yes Fay Records, MD  GLUCOSAMINE CHONDROITIN COMPLX PO Take 2 tablets by mouth daily.    Yes [provider]  meloxicam (MOBIC) 15 MG tablet Take 1 tablet by mouth once daily 12/06/18  Yes [name], MD  Multiple Vitamins-Minerals (ALIVE WOMENS 50+ PO) Take 1 tablet by mouth every morning.   Yes [provider]  OVER THE COUNTER MEDICATION Take 1 tablet by mouth daily. Tumeric and cumin    Yes [provider]  pantoprazole (PROTONIX) 40 MG tablet Take 1 tablet by mouth once daily 12/06/18  Yes [name], MD  raloxifene (EVISTA) 60 MG tablet Take 1 tablet by mouth once daily Patient taking differently: Take 60 mg by mouth every evening.  11/22/18  Yes [name], MD  timolol (BETIMOL) 0.5 % ophthalmic solution Place 1 drop into the left eye 2 (two) times daily.   Yes [provider]  TRAVATAN Z 0.004 % SOLN ophthalmic solution Place 1 drop into both eyes at bedtime.  06/22/15  Yes [provider]  sucralfate (CARAFATE) 1 g tablet Take 1 tablet (1 g total) by mouth 4 (four) times daily. 12/16/18   Dorie Rank, MD    Family History Family History  Problem Relation Age of Onset  . Hypertension Mother   . Cancer Mother        breast,anal  . Osteoporosis Mother   . Breast cancer Mother 25  . CAD Sister   . Hypothyroidism Sister     Social History Social History   Tobacco Use  . Smoking status: Never Smoker  . Smokeless tobacco: Never Used  Substance Use Topics  . Alcohol use: Yes    Comment:  occasionally  . Drug use: No     Allergies   Amoxicillin   Review of Systems Review of Systems  All other systems reviewed and are negative.    Physical Exam Updated Vital Signs BP (!) 152/76   Pulse 67   Temp 97.8 F (36.6 C) (Oral)   Resp 18   SpO2 98%   Physical Exam Vitals signs and nursing note reviewed.  Constitutional:      General: She is not in acute distress.    Appearance: She is well-developed.  HENT:     Head: Normocephalic and atraumatic.     Right Ear: External ear normal.     Left Ear: External ear normal.  Eyes:     General: No scleral icterus.       Right eye: No discharge.        Left eye: No discharge.     Conjunctiva/sclera: Conjunctivae normal.  Neck:     Musculoskeletal: Neck supple.      Trachea: No tracheal deviation.  Cardiovascular:     Rate and Rhythm: Normal rate and regular rhythm.  Pulmonary:     Effort: Pulmonary effort is normal. No respiratory distress.     Breath sounds: Normal breath sounds. No stridor. No wheezing or rales.  Abdominal:     General: Bowel sounds are normal. There is no distension.     Palpations: Abdomen is soft.     Tenderness: There is no abdominal tenderness. There is no guarding or rebound.  Musculoskeletal:  General: No tenderness.  Skin:    General: Skin is warm and dry.     Findings: No rash.  Neurological:     Mental Status: She is alert.     Cranial Nerves: No cranial nerve deficit (no facial droop, extraocular movements intact, no slurred speech).     Sensory: No sensory deficit.     Motor: No abnormal muscle tone or seizure activity.     Coordination: Coordination normal.      ED Treatments / Results  Labs (all labs ordered are listed, but only abnormal results are displayed) Labs Reviewed  BASIC METABOLIC PANEL  CBC  TROPONIN I (HIGH SENSITIVITY)  TROPONIN I (HIGH SENSITIVITY)    EKG EKG Interpretation  Date/Time:  Thursday December 16 2018 10:56:46 EDT Ventricular Rate:  64 PR Interval:  162 QRS Duration: 76 QT Interval:  408 QTC Calculation: 420 R Axis:   53 Text Interpretation:  Normal sinus rhythm Normal ECG No significant change since last tracing Confirmed by Dorie Rank [phone]) on 12/16/2018 12:03:28 PM   Radiology Dg Chest 2 View  Result Date: 12/16/2018 CLINICAL DATA:  Chest pain EXAM: CHEST - 2 VIEW COMPARISON:  12/05/2013 FINDINGS: Heart and mediastinal contours are within normal limits. No focal opacities or effusions. No acute bony abnormality. IMPRESSION: No active cardiopulmonary disease. Electronically Signed   By: Rolm Baptise M.D.   On: 12/16/2018 11:25    Procedures Procedures (including critical care time)  Medications Ordered in ED Medications  sodium chloride flush (NS) 0.9  % injection 3 mL (has no administration in time range)  nitroGLYCERIN (NITROGLYN) 2 % ointment 1 inch (1 inch Topical Refused 12/16/18 1322)  aspirin chewable tablet 324 mg (324 mg Oral Given 12/16/18 1328)     Initial Impression / Assessment and Plan / ED Course  I have reviewed the triage vital signs and the nursing notes.  Pertinent labs & imaging results that were available during my care of the patient were reviewed by me and considered in my medical decision making (see chart for details).     HEAR Score: 4  Patient's cardiac work-up is reassuring.  Laboratory tests and EKG are reassuring.  Chest x-ray without abnormalities.  Doubt pulmonary embolism or pneumonia.  No signs of pneumothorax or congestive heart failure.  Patient has coronary artery disease and had a moderate risk heart score but her serial troponins were negative.  Discussed having the patient be admitted to the hospital for observation versus close outpatient follow-up.  Patient would prefer to be discharged with close outpatient follow-up.  She will contact Dr. Harrington Challenger   Patient has mentioned some having some burping and belching while she is down in the ED. it is possible her symptoms are esophageal .  Will try adding on carafate  Final Clinical Impressions(s) / ED Diagnoses   Final diagnoses:  Precordial pain    ED Discharge Orders         Ordered    sucralfate (CARAFATE) 1 g tablet  4 times daily     12/16/18 1529           Dorie Rank, MD 12/16/18 1531

## 2018-12-16 NOTE — Telephone Encounter (Signed)
New Message:      Daughter called and wanted Dr Harrington Challenger or her nurse to know that pt is on her way to [facility] ER with Chest Pain.

## 2018-12-20 ENCOUNTER — Other Ambulatory Visit: Payer: Self-pay | Admitting: Internal Medicine

## 2018-12-22 NOTE — Progress Notes (Signed)
Cardiology Office Note    Date:  12/23/2018   ID:  Theda Kozy, DOB 1945-09-14, MRN [id number]  PCP:  [name], MD  Cardiologist: Dr. Harrington Challenger  Chief Complaint: ER follow up  History of Present Illness:   Denise Ewing is a 73 y.o. female with hx of CAD by CT angiogram and HLD seen for ER follow up.  She has hx of intermittent neurological deficits, some facial numbness on the left, seen by neuro with negative work up.  CT coronary 12/2013: IMPRESSION: 1. Coronary artery calcium score of 126 Agatston units. This places the patient in the [place] percentile for her age and gender. This suggests high risk for future cardiac events.  2. Extensive plaque noted in the proximal LAD. However, there did not appear to be more than 50% stenosis. The remainder of the LAD and the LCx and RCA systems did not have significant disease.  3. Unless patient has typical symptoms, would suggest aggressive medical management of CAD.  She had normal LV function by echo in 12/2013. She was last seen by Dr Harrington Challenger 03/2017.   She was seen in ER 12/16/2018 for chest pain. Ruled out in ER and discharge with plan for outpatient follow up.   Here today for follow up.  Patient had a episode of chest pain while at work on 10/22.  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  She had intermittent episode of substernal chest pain.  Each episode lasted 2 to 4 minutes with increasing in intensity. She was evaluated in ER as above.  Her pain resolved in ER with sucralfate and discharged on that.  No recurrence since then.  Her symptoms were different than typical GERD however had [place] soup night prior to her ER evaluation.  She walks with dog each day without any issue.  She has steep incline during her walk.  She walks 10 to 15 minutes 3 times per day.  No associated shortness of breath or chest pain.  She denies orthopnea, PND, syncope, lower extremity edema or melena.  She has intermittent nausea since on sucralfate.   Past  Medical History:  Diagnosis Date  . GERD (gastroesophageal reflux disease)   . Glaucoma   . History of colonic polyps   . Melanoma of thigh [facility])     Past Surgical History:  Procedure Laterality Date  . Colon polyps removed    . Melanoma removed     R thigh  . TOTAL ABDOMINAL HYSTERECTOMY W/ BILATERAL SALPINGOOPHORECTOMY     uterine fibroids    Current Medications: Prior to Admission medications   Medication Sig Start Date End Date Taking? Authorizing Provider  aspirin 81 MG tablet Take 81 mg by mouth daily.    [provider]  atorvastatin (LIPITOR) 10 MG tablet Take 1 tablet (10 mg total) by mouth daily. Please make overdue appt with Dr. Harrington Challenger before anymore refills. 2nd attempt 12/06/18   Fay Records, MD  Calcium Citrate-Vitamin D (CITRACAL/VITAMIN D) 250-200 MG-UNIT TABS Take 2 tablets by mouth at bedtime.     [provider]  Coenzyme Q10 (CO Q-10 PO) Take 1 capsule by mouth every morning.    [provider]  conjugated estrogens (PREMARIN) vaginal cream Place 1.5 g vaginally once a week. Take one tablet by mouth on Sundays per patient    [provider]  ezetimibe (ZETIA) 10 MG tablet Take 0.5 tablets (5 mg total) by mouth daily. TAKE 1/2 (ONE-HALF) TABLET BY MOUTH ONCE DAILY . APPOINTMENT REQUIRED  FOR FUTURE REFILLS Patient taking differently: Take 5 mg by mouth daily.  10/21/18   Fay Records, MD  GLUCOSAMINE CHONDROITIN COMPLX PO Take 2 tablets by mouth daily.     [provider]  meloxicam (MOBIC) 15 MG tablet Take 1 tablet by mouth once daily 12/06/18   [name], MD  Multiple Vitamins-Minerals (ALIVE WOMENS 50+ PO) Take 1 tablet by mouth every morning.    [provider]  OVER THE COUNTER MEDICATION Take 1 tablet by mouth daily. Tumeric and cumin     [provider]  pantoprazole (PROTONIX) 40 MG tablet Take 1 tablet by mouth once daily 12/06/18   [name], MD  raloxifene (EVISTA) 60 MG  tablet Take 1 tablet by mouth once daily Patient taking differently: Take 60 mg by mouth every evening.  11/22/18   [name], MD  sucralfate (CARAFATE) 1 g tablet Take 1 tablet (1 g total) by mouth 4 (four) times daily. 12/16/18   Dorie Rank, MD  timolol (BETIMOL) 0.5 % ophthalmic solution Place 1 drop into the left eye 2 (two) times daily.    [provider]  TRAVATAN Z 0.004 % SOLN ophthalmic solution Place 1 drop into both eyes at bedtime.  06/22/15   [provider]    Allergies:   Amoxicillin   Social History   Socioeconomic History  . Marital status: Married    Spouse name: Not on file  . Number of children: Not on file  . Years of education: Not on file  . Highest education level: Not on file  Occupational History  . Not on file  Social Needs  . Financial resource strain: Not on file  . Food insecurity    Worry: Not on file    Inability: Not on file  . Transportation needs    Medical: Not on file    Non-medical: Not on file  Tobacco Use  . Smoking status: Never Smoker  . Smokeless tobacco: Never Used  Substance and Sexual Activity  . Alcohol use: Yes    Comment:  occasionally  . Drug use: No  . Sexual activity: Not on file  Lifestyle  . Physical activity    Days per week: Not on file    Minutes per session: Not on file  . Stress: Not on file  Relationships  . Social Herbalist on phone: Not on file    Gets together: Not on file    Attends religious service: Not on file    Active member of club or organization: Not on file    Attends meetings of clubs or organizations: Not on file    Relationship status: Not on file  Other Topics Concern  . Not on file  Social History Narrative  . Not on file     Family History:  The patient's family history includes Breast cancer (age of onset: 28) in her mother; CAD in her sister; Cancer in her mother; Hypertension in her mother; Hypothyroidism in her sister; Osteoporosis in her  mother.   ROS:   Please see the history of present illness.    ROS All other systems reviewed and are negative.   PHYSICAL EXAM:   VS:  BP 128/74   Pulse 67   Ht 5' 1.5" (1.562 m)   Wt 156 lb 6.4 oz (70.9 kg)   SpO2 98%   BMI 29.07 kg/m    GEN: Well nourished, well developed, in no acute distress  HEENT: normal  Neck: no JVD, carotid bruits, or masses Cardiac: RRR; no murmurs, rubs, or gallops,no edema  Respiratory:  clear to auscultation bilaterally, normal work of breathing GI: soft, nontender, nondistended, + BS MS: no deformity or atrophy  Skin: warm and dry, no rash Neuro:  Alert and Oriented x 3, Strength and sensation are intact Psych: euthymic mood, full affect  Wt Readings from Last 3 Encounters:  12/23/18 156 lb 6.4 oz (70.9 kg)  08/11/18 154 lb (69.9 kg)  07/28/18 150 lb (68 kg)      Studies/Labs Reviewed:   EKG:  EKG is not ordered today.  The ekg done 12/16/2018  demonstrates SR with J point elevation (chronic)  Recent Labs: 08/11/2018: ALT 16; TSH 2.49 12/16/2018: BUN 19; Creatinine, Ser 0.81; Hemoglobin 13.0; Platelets 254; Potassium 4.4; Sodium 137   Lipid Panel    Component Value Date/Time   CHOL 120 08/11/2018 0908   CHOL 110 04/10/2016 1143   CHOL 96 (L) 12/21/2014 0824   TRIG 70.0 08/11/2018 0908   TRIG 66 12/21/2014 0824   HDL 53.60 08/11/2018 0908   HDL 54 04/10/2016 1143   HDL 45 (L) 12/21/2014 0824   CHOLHDL 2 08/11/2018 0908   VLDL 14.0 08/11/2018 0908   LDLCALC 52 08/11/2018 0908   LDLCALC 45 04/10/2016 1143   LDLCALC 38 12/21/2014 0824    Additional studies/ records that were reviewed today include:  As summarized above    ASSESSMENT & PLAN:    1. Chest pain No recurrence since on sucralfate however experiencing intermittent nausea.  Her symptoms seems more due to GI etiology possible esophageal spasm.  Differential includes angina versus coronary spasm.  Encouraged to follow-up with PCP/GI.  She will try to increase her  walking with steep incline.  If she has recurrent pain, she will give [place] call.  2.  CAD -50% LAD lesion by CT coronary in 2015.  Continue aspirin and statin.  If she has recurrent chest pain will consider stress test versus repeat coronary CT.  3.  Hyperlipidemia - 08/11/2018: Cholesterol 120; HDL 53.60; LDL Cholesterol 52; Triglycerides 70.0; VLDL 14.0  - continue Lipitor and zetia.     Medication Adjustments/Labs and Tests Ordered: Current medicines are reviewed at length with the patient today.  Concerns regarding medicines are outlined above.  Medication changes, Labs and Tests ordered today are listed in the Patient Instructions below. Patient Instructions  Medication Instructions:  Your physician recommends that you continue on your current medications as directed. Please refer to the Current Medication list given to you today.  *If you need a refill on your cardiac medications before your next appointment, please call your pharmacy*  Lab Work: NONE If you have labs (blood work) drawn today and your tests are completely normal, you will receive your results only by: [name] MyChart Message (if you have MyChart) OR . A paper copy in the mail If you have any lab test that is abnormal or we need to change your treatment, we will call you to review the results.  Testing/Procedures: NONE  Follow-Up: At [facility], you and your health needs are our priority.  As part of our continuing mission to provide you with exceptional heart care, we have created designated Provider Care Teams.  These Care Teams include your primary Cardiologist (physician) and Advanced Practice Providers (APPs -  Physician Assistants and Nurse Practitioners) who all work together to provide you with the care you need, when you need it.  Your next  appointment:   3 months on 03/21/19 at 9:20 am  The format for your next appointment:   In Person  Provider:    DR. Harrington Challenger     Signed, Leanor Kail, PA   12/23/2018 10:39 AM    [address], [place], [place]  [address] Phone: [phone]; Fax: [phone]

## 2018-12-23 ENCOUNTER — Encounter: Payer: Self-pay | Admitting: Physician Assistant

## 2018-12-23 ENCOUNTER — Ambulatory Visit: Payer: Medicare Other | Admitting: Physician Assistant

## 2018-12-23 ENCOUNTER — Other Ambulatory Visit: Payer: Self-pay

## 2018-12-23 VITALS — BP 128/74 | HR 67 | Ht 61.5 in | Wt 156.4 lb

## 2018-12-23 DIAGNOSIS — R079 Chest pain, unspecified: Secondary | ICD-10-CM | POA: Diagnosis not present

## 2018-12-23 DIAGNOSIS — E782 Mixed hyperlipidemia: Secondary | ICD-10-CM | POA: Diagnosis not present

## 2018-12-23 DIAGNOSIS — I2510 Atherosclerotic heart disease of native coronary artery without angina pectoris: Secondary | ICD-10-CM

## 2018-12-23 MED ORDER — ATORVASTATIN CALCIUM 10 MG PO TABS: 10.0000 mg | ORAL_TABLET | Freq: Every day | ORAL | 3 refills | Status: DC

## 2018-12-23 NOTE — Patient Instructions (Addendum)
Medication Instructions:  Your physician recommends that you continue on your current medications as directed. Please refer to the Current Medication list given to you today.  *If you need a refill on your cardiac medications before your next appointment, please call your pharmacy*  Lab Work: NONE If you have labs (blood work) drawn today and your tests are completely normal, you will receive your results only by: [name] MyChart Message (if you have MyChart) OR . A paper copy in the mail If you have any lab test that is abnormal or we need to change your treatment, we will call you to review the results.  Testing/Procedures: NONE  Follow-Up: At [facility], you and your health needs are our priority.  As part of our continuing mission to provide you with exceptional heart care, we have created designated Provider Care Teams.  These Care Teams include your primary Cardiologist (physician) and Advanced Practice Providers (APPs -  Physician Assistants and Nurse Practitioners) who all work together to provide you with the care you need, when you need it.  Your next appointment:   3 months on 03/21/19 at 9:20 am  The format for your next appointment:   In Person  Provider:    DR. Harrington Challenger

## 2018-12-27 ENCOUNTER — Encounter: Payer: Self-pay | Admitting: Family Medicine

## 2018-12-27 DIAGNOSIS — R1013 Epigastric pain: Secondary | ICD-10-CM

## 2018-12-27 DIAGNOSIS — K219 Gastro-esophageal reflux disease without esophagitis: Secondary | ICD-10-CM

## 2018-12-28 ENCOUNTER — Encounter: Payer: Self-pay | Admitting: Nurse Practitioner

## 2019-01-01 ENCOUNTER — Other Ambulatory Visit: Payer: Self-pay | Admitting: Family Medicine

## 2019-01-01 NOTE — Progress Notes (Signed)
01/01/2019 [name] [id number] 1945/06/28   HISTORY OF PRESENT ILLNESS: Denise Ewing is a 73 year old female with a past medica history of coronary artery disease, glaucoma, malignant melanoma stage I to the right thigh 2001, colon polyps and GERD. Past total hysterectomy. She takes Pantoprazole 40mg  once daily. She presents today for further evaluation for GERD and mid esophageal pain.  She ate [place] soup and the next morning she developed mid chest pain and she felt light headed. She was concerned she was having heart pain so she went to [facility] ER 12/16/2018. A 12 lead EKG showed NSR without evidence of ischemia. Troponin was normal. A chest xray was negative. She received a GI cocktail and her chest pain significantly improved. She was discharged home on Carafate 1gm po qid. She was seen by her cardiology PA-C B. Bhagat on 10/29 and it was assessed that her chest pain was not cardiac and she was advised to proceed with a GI evaluation.  She continues to have a dull upper esophageal pain that comes and goes for 15 minutes at a time, doesn't always occur when eating. She complains of having loud burps which is disturbing to her.She chews gum throughout the day for many years. No carbonated beverages.  Her symptoms have not improved on Carafate. She takes ASA 81mg  QD as prescribed by her cardiologist and Meloxicam 15mg  po QD for arthritis. She questions if she has a hiatal hernia, her mother had a hiatal hernia. She is passing a normal brown formed bowel movement daily. No rectal bleeding or black stools. She underwent one colonoscopy in [place] in 2009, she reported 2 polyps were removed. She completed a Cologuard Test a few years ago which she reported was normal. Her mother was diagnosed with rectal cancer at the age of 57. She questioned if she should do another Cologuard test. I advised the patient due to her personal history of colon polyps and family history of rectal cancer a conventional  colonoscopy would be recommended and not a Cologuard Test . She does not wish to schedule a colonoscopy at this time. Her sister died at the age of 16 due to a blood clot "in her neck".    Labs 12/16/2018: Na 137. K 4.4. BUN 19. Cr. 0.81. WBC 6.3. Hg 13.0. HCT 40. PLT 245.   Past Medical History:  Diagnosis Date   GERD (gastroesophageal reflux disease)    Glaucoma    History of colonic polyps    Melanoma of thigh ([facility])    Past Surgical History:  Procedure Laterality Date   Colon polyps removed     Melanoma removed     R thigh   TOTAL ABDOMINAL HYSTERECTOMY W/ BILATERAL SALPINGOOPHORECTOMY     uterine fibroids    reports that she has never smoked. She has never used smokeless tobacco. She reports current alcohol use. She reports that she does not use drugs. family history includes Breast cancer (age of onset: 85) in her mother; CAD in her sister; Cancer in her mother; Hypertension in her mother; Hypothyroidism in her sister; Osteoporosis in her mother. Allergies  Allergen Reactions   Amoxicillin Hives    Did it involve swelling of the face/tongue/throat, SOB, or low BP? No Did it involve sudden or severe rash/hives, skin peeling, or any reaction on the inside of your mouth or nose? Yes Did you need to seek medical attention at a hospital or doctor's office? No When did it last happen?>3yrs ago  If all  above answers are NO, may proceed with cephalosporin use.      Outpatient Encounter Medications as of 01/03/2019  Medication Sig   aspirin 81 MG tablet Take 81 mg by mouth daily.   atorvastatin (LIPITOR) 10 MG tablet Take 1 tablet (10 mg total) by mouth daily.   Calcium Citrate-Vitamin D (CITRACAL/VITAMIN D) 250-200 MG-UNIT TABS Take 2 tablets by mouth at bedtime.    Coenzyme Q10 (CO Q-10 PO) Take 1 capsule by mouth every morning.   conjugated estrogens (PREMARIN) vaginal cream Place 1.5 g vaginally once a week. Take one tablet by mouth on Sundays per patient     ezetimibe (ZETIA) 10 MG tablet Take 5 mg by mouth daily.   GLUCOSAMINE CHONDROITIN COMPLX PO Take 2 tablets by mouth daily.    meloxicam (MOBIC) 15 MG tablet Take 1 tablet by mouth once daily   Multiple Vitamins-Minerals (ALIVE WOMENS 50+ PO) Take 1 tablet by mouth every morning.   OVER THE COUNTER MEDICATION Take 1 tablet by mouth daily. Tumeric and cumin    pantoprazole (PROTONIX) 40 MG tablet Take 1 tablet by mouth once daily   raloxifene (EVISTA) 60 MG tablet Take 60 mg by mouth daily.   sucralfate (CARAFATE) 1 g tablet Take 1 tablet (1 g total) by mouth 4 (four) times daily.   timolol (BETIMOL) 0.5 % ophthalmic solution Place 1 drop into the left eye 2 (two) times daily.   TRAVATAN Z 0.004 % SOLN ophthalmic solution Place 1 drop into both eyes at bedtime.    No facility-administered encounter medications on file as of 01/03/2019.     REVIEW OF SYSTEMS  : All other systems reviewed and negative except where noted in the History of Present Illness.  PHYSICAL EXAM: There were no vitals taken for this visit. General: Well developed white female in no acute distress Head: Normocephalic and atraumatic Eyes:  sclerae anicteric,conjunctive pink. Ears: Normal auditory acuity Neck: Supple, no masses.  Mouth: Dentition intact.  Lungs: Clear throughout to auscultation Heart: Regular rate and rhythm Abdomen: Soft, nontender, non distended. No masses or hepatomegaly noted. Normal bowel sounds Rectal: Deferred. Musculoskeletal: Symmetrical with no gross deformities  Skin: No lesions on visible extremities Extremities: No edema  Neurological: Alert oriented x 4, grossly nonfocal Cervical Nodes:  No significant cervical adenopathy Psychological:  Alert and cooperative. Normal mood and affect  ASSESSMENT AND PLAN:  83. 73 year old female with GERD, increased burping, esophageal pain, possible esophageal spasms. + NSAID use. -EGD benefits and risks discussed, including risk with  sedation, risk of bleeding, perforation and infection -Continue Pantoprazole 40mg  Qam. Add Pepcid 20mg  after dinner. -May need to change arthritis treatment  -Stop chewing gum -Follow up in office 2 weeks after EGD completed   2. History of colon polyps p. Mother with history of rectal cancer. -patient to follow up in the office 2 weeks after her EGD completed, to further discuss scheduling a colonoscopy at that time.  3. CAD with 50% LAD lesion by CT coronary in 2015.  On Aspirin and Atorvastatin.     CC:  Denise Minium, MD

## 2019-01-03 ENCOUNTER — Ambulatory Visit: Payer: Medicare Other | Admitting: Nurse Practitioner

## 2019-01-03 ENCOUNTER — Encounter: Payer: Self-pay | Admitting: Nurse Practitioner

## 2019-01-03 VITALS — BP 126/72 | HR 80 | Temp 96.7°F | Ht 61.0 in | Wt 155.2 lb

## 2019-01-03 DIAGNOSIS — R142 Eructation: Secondary | ICD-10-CM | POA: Diagnosis not present

## 2019-01-03 DIAGNOSIS — K2289 Other specified disease of esophagus: Secondary | ICD-10-CM

## 2019-01-03 DIAGNOSIS — K219 Gastro-esophageal reflux disease without esophagitis: Secondary | ICD-10-CM

## 2019-01-03 DIAGNOSIS — Z800 Family history of malignant neoplasm of digestive organs: Secondary | ICD-10-CM

## 2019-01-03 DIAGNOSIS — Z86010 Personal history of colonic polyps: Secondary | ICD-10-CM | POA: Diagnosis not present

## 2019-01-03 DIAGNOSIS — K228 Other specified diseases of esophagus: Secondary | ICD-10-CM

## 2019-01-03 DIAGNOSIS — Z1159 Encounter for screening for other viral diseases: Secondary | ICD-10-CM

## 2019-01-03 MED ORDER — FAMOTIDINE 20 MG PO TABS: ORAL_TABLET | ORAL | 3 refills | Status: DC

## 2019-01-03 NOTE — Patient Instructions (Addendum)
You have been scheduled for an endoscopy. Please follow written instructions given to you at your visit today. If you use inhalers (even only as needed), please bring them with you on the day of your procedure.  We have sent the following medications to your pharmacy for you to pick up at your convenience:  Pepcid - take one after dinner.  Continue Protonix 40mg  in the morning.  Please follow up with Dr. Ardis Hughs on 02/15/2019 at 2:30pm

## 2019-01-03 NOTE — Progress Notes (Signed)
I agree with the above note, plan 

## 2019-01-18 ENCOUNTER — Encounter: Payer: Self-pay | Admitting: Gastroenterology

## 2019-01-28 ENCOUNTER — Other Ambulatory Visit: Payer: Self-pay | Admitting: Gastroenterology

## 2019-01-28 ENCOUNTER — Ambulatory Visit (INDEPENDENT_AMBULATORY_CARE_PROVIDER_SITE_OTHER): Payer: Medicare Other

## 2019-01-28 DIAGNOSIS — Z1231 Encounter for screening mammogram for malignant neoplasm of breast: Secondary | ICD-10-CM | POA: Diagnosis not present

## 2019-01-28 DIAGNOSIS — Z1159 Encounter for screening for other viral diseases: Secondary | ICD-10-CM | POA: Diagnosis not present

## 2019-01-28 LAB — SARS CORONAVIRUS 2 (TAT 6-24 HRS): SARS Coronavirus 2: NEGATIVE

## 2019-01-28 LAB — HM MAMMOGRAPHY: HM Mammogram: NORMAL (ref 0–4)

## 2019-01-28 LAB — COLOGUARD: Cologuard: NEGATIVE

## 2019-02-01 ENCOUNTER — Other Ambulatory Visit: Payer: Self-pay | Admitting: Family Medicine

## 2019-02-01 ENCOUNTER — Other Ambulatory Visit: Payer: Self-pay

## 2019-02-01 ENCOUNTER — Other Ambulatory Visit: Payer: Self-pay | Admitting: Gastroenterology

## 2019-02-01 ENCOUNTER — Ambulatory Visit (AMBULATORY_SURGERY_CENTER): Payer: Medicare Other | Admitting: Gastroenterology

## 2019-02-01 ENCOUNTER — Encounter: Payer: Self-pay | Admitting: Gastroenterology

## 2019-02-01 VITALS — BP 101/56 | HR 62 | Temp 98.0°F | Resp 11 | Ht 61.0 in | Wt 155.0 lb

## 2019-02-01 DIAGNOSIS — K219 Gastro-esophageal reflux disease without esophagitis: Secondary | ICD-10-CM

## 2019-02-01 DIAGNOSIS — R12 Heartburn: Secondary | ICD-10-CM

## 2019-02-01 DIAGNOSIS — K2970 Gastritis, unspecified, without bleeding: Secondary | ICD-10-CM | POA: Diagnosis not present

## 2019-02-01 DIAGNOSIS — R1310 Dysphagia, unspecified: Secondary | ICD-10-CM | POA: Diagnosis not present

## 2019-02-01 DIAGNOSIS — K2950 Unspecified chronic gastritis without bleeding: Secondary | ICD-10-CM | POA: Diagnosis not present

## 2019-02-01 MED ORDER — SODIUM CHLORIDE 0.9 % IV SOLN: 500.0000 mL | Freq: Once | INTRAVENOUS | Status: DC

## 2019-02-01 NOTE — Patient Instructions (Signed)
YOU HAD AN ENDOSCOPIC PROCEDURE TODAY AT [facility]:   Refer to the procedure report that was given to you for any specific questions about what was found during the examination.  If the procedure report does not answer your questions, please call your gastroenterologist to clarify.  If you requested that your care partner not be given the details of your procedure findings, then the procedure report has been included in a sealed envelope for you to review at your convenience later.  YOU SHOULD EXPECT: Some feelings of bloating in the abdomen. Passage of more gas than usual.  Walking can help get rid of the air that was put into your GI tract during the procedure and reduce the bloating. If you had a lower endoscopy (such as a colonoscopy or flexible sigmoidoscopy) you may notice spotting of blood in your stool or on the toilet paper. If you underwent a bowel prep for your procedure, you may not have a normal bowel movement for a few days.  Please Note:  You might notice some irritation and congestion in your nose or some drainage.  This is from the oxygen used during your procedure.  There is no need for concern and it should clear up in a day or so.  SYMPTOMS TO REPORT IMMEDIATELY:     Following upper endoscopy (EGD)  Vomiting of blood or coffee ground material  New chest pain or pain under the shoulder blades  Painful or persistently difficult swallowing  New shortness of breath  Fever of 100F or higher  Black, tarry-looking stools  For urgent or emergent issues, a gastroenterologist can be reached at any hour by calling [phone].   DIET:  We do recommend a small meal at first, but then you may proceed to your regular diet.  Drink plenty of fluids but you should avoid alcoholic beverages for 24 hours.  ACTIVITY:  You should plan to take it easy for the rest of today and you should NOT DRIVE or use heavy machinery until tomorrow (because of the sedation medicines  used during the test).    FOLLOW UP: Our staff will call the number listed on your records 48-72 hours following your procedure to check on you and address any questions or concerns that you may have regarding the information given to you following your procedure. If we do not reach you, we will leave a message.  We will attempt to reach you two times.  During this call, we will ask if you have developed any symptoms of COVID 19. If you develop any symptoms (ie: fever, flu-like symptoms, shortness of breath, cough etc.) before then, please call [phone].  If you test positive for Covid 19 in the 2 weeks post procedure, please call and report this information to [place].    If any biopsies were taken you will be contacted by phone or by letter within the next 1-3 weeks.  Please call us at [phone] if you have not heard about the biopsies in 3 weeks.    SIGNATURES/CONFIDENTIALITY: You and/or your care partner have signed paperwork which will be entered into your electronic medical record.  These signatures attest to the fact that that the information above on your After Visit Summary has been reviewed and is understood.  Full responsibility of the confidentiality of this discharge information lies with you and/or your care-partner.   Continue protonix every morning and pepcid 20 mg at bedtime every night,avoid caffene,resume remainder of medication. Information given on  gastritis.

## 2019-02-01 NOTE — Progress Notes (Signed)
A and O x3. Report to RN. Tolerated MAC anesthesia well.Teeth unchanged after procedure.

## 2019-02-01 NOTE — Progress Notes (Signed)
VS-CW [id number]

## 2019-02-01 NOTE — Op Note (Signed)
[facility] Patient Name: Denise Ewing Procedure Date: 02/01/2019 8:57 AM MRN: [id number] Endoscopist: Milus Banister , MD Age: 73 Referring MD:  Date of Birth: May 09, 1945 Gender: Female Account #: [id number] Procedure:                Upper GI endoscopy Indications:              Odynophagia, Heartburn Medicines:                Monitored Anesthesia Care Procedure:                Pre-Anesthesia Assessment:                           - Prior to the procedure, a History and Physical                            was performed, and patient medications and                            allergies were reviewed. The patient's tolerance of                            previous anesthesia was also reviewed. The risks                            and benefits of the procedure and the sedation                            options and risks were discussed with the patient.                            All questions were answered, and informed consent                            was obtained. Prior Anticoagulants: The patient has                            taken no previous anticoagulant or antiplatelet                            agents. ASA Grade Assessment: II - A patient with                            mild systemic disease. After reviewing the risks                            and benefits, the patient was deemed in                            satisfactory condition to undergo the procedure.                           After obtaining informed consent, the endoscope was  passed under direct vision. Throughout the                            procedure, the patient's blood pressure, pulse, and                            oxygen saturations were monitored continuously. The                            Endoscope was introduced through the mouth, and                            advanced to the second part of duodenum. The upper                            GI endoscopy was accomplished  without difficulty.                            The patient tolerated the procedure well. Scope In: Scope Out: Findings:                 Mild inflammation characterized by erythema and                            linear erosions was found in the gastric antrum.                            Biopsies were taken with a cold forceps for                            histology.                           The exam was otherwise without abnormality. Complications:            No immediate complications. Estimated blood loss:                            None. Estimated Blood Loss:     Estimated blood loss: none. Impression:               - Gastritis; likely related to daily meloxicam,                            biopsied to check for H. pylori.                           - The examination was otherwise normal. Recommendation:           - Patient has a contact number available for                            emergencies. The signs and symptoms of potential                            delayed complications were discussed with the  patient. Return to normal activities tomorrow.                            Written discharge instructions were provided to the                            patient.                           - Resume previous diet.                           - Continue present medications. Protonix every                            morning and pepcid (famotidine) 20mg  pill one pill                            at bedtime every night. Continue avoiding caffene                            after noon.                           - Await pathology results. Milus Banister, MD 02/01/2019 9:12:45 AM This report has been signed electronically.

## 2019-02-01 NOTE — Progress Notes (Signed)
Called to room to assist during endoscopic procedure.  Patient ID and intended procedure confirmed with present staff. Received instructions for my participation in the procedure from the performing physician.  

## 2019-02-03 ENCOUNTER — Telehealth: Payer: Self-pay

## 2019-02-03 ENCOUNTER — Telehealth: Payer: Self-pay | Admitting: *Deleted

## 2019-02-03 NOTE — Telephone Encounter (Signed)
Follow up call made, left message. 

## 2019-02-03 NOTE — Telephone Encounter (Signed)
  Follow up Call-  Call back number 02/01/2019  Post procedure Call Back phone  # [phone]  Permission to leave phone message Yes  Some recent data might be hidden     Patient questions:  Do you have a fever, pain , or abdominal swelling? No. Pain Score  0 *  Have you tolerated food without any problems? Yes.    Have you been able to return to your normal activities? Yes.    Do you have any questions about your discharge instructions: Diet   No. Medications  No. Follow up visit  No.  Do you have questions or concerns about your Care? No.  Actions: * If pain score is 4 or above: No action needed, pain <4.  1. Have you developed a fever since your procedure? no  2.   Have you had an respiratory symptoms (SOB or cough) since your procedure? no  3.   Have you tested positive for COVID 19 since your procedure no  4.   Have you had any family members/close contacts diagnosed with the COVID 19 since your procedure?  no   If yes to any of these questions please route to Joylene John, RN and Alphonsa Gin, Therapist, sports.

## 2019-02-07 ENCOUNTER — Encounter: Payer: Self-pay | Admitting: Gastroenterology

## 2019-02-08 DIAGNOSIS — Z6828 Body mass index (BMI) 28.0-28.9, adult: Secondary | ICD-10-CM | POA: Diagnosis not present

## 2019-02-08 DIAGNOSIS — Z01419 Encounter for gynecological examination (general) (routine) without abnormal findings: Secondary | ICD-10-CM | POA: Diagnosis not present

## 2019-02-09 ENCOUNTER — Encounter: Payer: Self-pay | Admitting: Family Medicine

## 2019-02-09 ENCOUNTER — Ambulatory Visit (INDEPENDENT_AMBULATORY_CARE_PROVIDER_SITE_OTHER): Payer: Medicare Other | Admitting: Family Medicine

## 2019-02-09 ENCOUNTER — Other Ambulatory Visit: Payer: Self-pay

## 2019-02-09 VITALS — BP 134/88 | Ht 61.0 in | Wt 148.0 lb

## 2019-02-09 DIAGNOSIS — E785 Hyperlipidemia, unspecified: Secondary | ICD-10-CM | POA: Diagnosis not present

## 2019-02-09 DIAGNOSIS — Z0000 Encounter for general adult medical examination without abnormal findings: Secondary | ICD-10-CM | POA: Diagnosis not present

## 2019-02-09 DIAGNOSIS — M818 Other osteoporosis without current pathological fracture: Secondary | ICD-10-CM | POA: Diagnosis not present

## 2019-02-09 DIAGNOSIS — E663 Overweight: Secondary | ICD-10-CM | POA: Diagnosis not present

## 2019-02-09 DIAGNOSIS — M19049 Primary osteoarthritis, unspecified hand: Secondary | ICD-10-CM

## 2019-02-09 NOTE — Assessment & Plan Note (Signed)
Pt is down 6 lbs since last visit.  Applauded her efforts at healthy diet and regular exercise.  Will continue to follow. 

## 2019-02-09 NOTE — Progress Notes (Signed)
Virtual Visit via Video   I connected with patient on 02/09/19 at  8:30 AM EST by a video enabled telemedicine application and verified that I am speaking with the correct person using two identifiers.  Location patient: Home Location provider: [facility], Office Persons participating in the virtual visit: Patient, Provider, Williams (Jess B)  I discussed the limitations of evaluation and management by telemedicine and the availability of in person appointments. The patient expressed understanding and agreed to proceed.  Subjective:   HPI:   Here today for MWV.  Risk Factors: Hyperlipidemia- chronic problem, on Lipitor 10mg  nightly and Zetia 10mg   Denies CP (w/ exception of GERD), SOB, abd pain, N/V. Overweight- pt is down 6 lbs since last visit.  Exercising regularly Osteoporosis- chronic problem, on Evista 60mg  daily per GYN.  Due for DEXA Hand arthritis- worsening pain since stopping the Meloxicam due to GI issues.  Interested in alternatives to meloxicam. Physical Activity: walking regularly Fall Risk: low Depression: denies current sxs Hearing: normal to conversational tones, mildly decreased to whispered voice ADL's: independent Cognitive: normal linear thought process, memory and attention intact Home Safety: safe at home, lives w/ husband and dogs Height, Weight, BMI, Visual Acuity: see vitals, vision corrected to 20/20 w/ glasses Counseling: UTD on mammo, due for DEXA.  Has GI appt upcoming for colonoscopy, UTD on immunizations Labs Ordered: See A&P Care Plan: See A&P   Patient Care Team    Relationship Specialty Notifications Start End  [name], MD PCP - General   02/06/10   Linda Hedges, [place] Physician Obstetrics and Gynecology  04/18/14   Marygrace Drought, MD Consulting Physician Ophthalmology  08/02/15   Milus Banister, MD Attending Physician Gastroenterology  02/09/19     Health Maintenance  Topic Date Due  . DEXA SCAN  01/22/2019    . COLONOSCOPY  03/12/2019 (Originally 02/24/2017)  . Hepatitis C Screening  07/28/2019 (Originally Feb 26, 1945)  . TETANUS/TDAP  02/09/2020 (Originally 02/08/2017)  . COLON CANCER SCREENING ANNUAL FOBT  02/22/2019  . MAMMOGRAM  01/28/2020  . INFLUENZA VACCINE  Completed  . PNA vac Low Risk Adult  Completed     ROS:   See pertinent positives and negatives per HPI.  Patient Active Problem List   Diagnosis Date Noted  . Overweight (BMI 25.0-29.9) 02/09/2018  . Numbness and tingling 08/05/2016  . Osteoporosis 04/18/2014  . Hyperlipidemia 04/18/2014  . CAD (coronary artery disease), native coronary artery 01/11/2014  . Chest pain 01/05/2014  . Pain in the chest   . Hand arthritis 03/23/2013  . Eczema 02/08/2013  . Routine general medical examination at a health care facility 02/06/2012  . Lumbar radicular pain 09/05/2011  . Fatigue 12/23/2010  . Neuropathy of foot 11/04/2010  . GERD 12/05/2009  . DYSPAREUNIA 12/05/2009  . SKIN CANCER, HX OF 12/05/2009  . COLONIC POLYPS, HX OF 12/05/2009    Social History   Tobacco Use  . Smoking status: Never Smoker  . Smokeless tobacco: Never Used  Substance Use Topics  . Alcohol use: Yes    Comment:  occasionally    Current Outpatient Medications:  .  aspirin 81 MG tablet, Take 81 mg by mouth daily., Disp: , Rfl:  .  atorvastatin (LIPITOR) 10 MG tablet, Take 1 tablet (10 mg total) by mouth daily., Disp: 90 tablet, Rfl: 3 .  Calcium Citrate-Vitamin D (CITRACAL/VITAMIN D) 250-200 MG-UNIT TABS, Take 2 tablets by mouth at bedtime. , Disp: , Rfl:  .  Coenzyme Q10 (CO  Q-10 PO), Take 1 capsule by mouth every morning., Disp: , Rfl:  .  conjugated estrogens (PREMARIN) vaginal cream, Place 1.5 g vaginally once a week. Take one tablet by mouth on Sundays per patient, Disp: , Rfl:  .  ezetimibe (ZETIA) 10 MG tablet, Take 5 mg by mouth daily., Disp: , Rfl:  .  famotidine (PEPCID) 20 MG tablet, Take one tablet after dinner, Disp: 30 tablet, Rfl: 3 .   GLUCOSAMINE CHONDROITIN COMPLX PO, Take 2 tablets by mouth daily. , Disp: , Rfl:  .  meloxicam (MOBIC) 15 MG tablet, Take 1 tablet by mouth once daily, Disp: 30 tablet, Rfl: 0 .  Multiple Vitamins-Minerals (ALIVE WOMENS 50+ PO), Take 1 tablet by mouth every morning., Disp: , Rfl:  .  OVER THE COUNTER MEDICATION, Take 1 tablet by mouth daily. Tumeric and cumin , Disp: , Rfl:  .  pantoprazole (PROTONIX) 40 MG tablet, Take 1 tablet by mouth once daily, Disp: 90 tablet, Rfl: 0 .  raloxifene (EVISTA) 60 MG tablet, Take 60 mg by mouth daily., Disp: , Rfl:  .  timolol (BETIMOL) 0.5 % ophthalmic solution, Place 1 drop into the left eye 2 (two) times daily., Disp: , Rfl:  .  TRAVATAN Z 0.004 % SOLN ophthalmic solution, Place 1 drop into both eyes at bedtime. , Disp: , Rfl: 1  Allergies  Allergen Reactions  . Amoxicillin Hives    Did it involve swelling of the face/tongue/throat, SOB, or low BP? No Did it involve sudden or severe rash/hives, skin peeling, or any reaction on the inside of your mouth or nose? Yes Did you need to seek medical attention at a hospital or doctor's office? No When did it last happen?>50yrs ago  If all above answers are "NO", may proceed with cephalosporin use.    Objective:   BP 134/88   Ht 5\' 1"  (1.549 m)   Wt 148 lb (67.1 kg)   BMI 27.96 kg/m  AAOx3, NAD NCAT, EOMI No obvious CN deficits Coloring WNL Pt is able to speak clearly, coherently without shortness of breath or increased work of breathing.  Thought process is linear.  Mood is appropriate.   Assessment and Plan:   Wellness- pt is UTD on colon cancer screen, mammo, immunizations.  DEXA ordered today.  Applauded her efforts at healthy diet and regular exercise and her recent 6 lb weight loss.  Discussed importance of daily exercise- particularly given her osteoporosis.  See problem based charting for rest  Annye Asa, MD 02/09/2019

## 2019-02-09 NOTE — Assessment & Plan Note (Signed)
Chronic problem.  Tolerating statin w/o difficulty.  Check labs.  Adjust meds prn  

## 2019-02-09 NOTE — Assessment & Plan Note (Signed)
Ongoing issue for pt.  Due for repeat DEXA (ordered).  On Evista 60mg  daily.  Will continue to follow.

## 2019-02-09 NOTE — Progress Notes (Signed)
I have discussed the procedure for the virtual visit with the patient who has given consent to proceed with assessment and treatment.   Trang Bouse L Willard Madrigal, CMA     

## 2019-02-09 NOTE — Assessment & Plan Note (Signed)
Deteriorated since stopping Meloxicam due to GI issues.  Will start Voltaren gel topically to improve hand pain but avoid systemic side effects.  Pt expressed understanding and is in agreement w/ plan.

## 2019-02-11 ENCOUNTER — Encounter: Payer: Self-pay | Admitting: Family Medicine

## 2019-02-15 ENCOUNTER — Ambulatory Visit: Payer: Medicare Other | Admitting: Gastroenterology

## 2019-02-15 ENCOUNTER — Encounter: Payer: Self-pay | Admitting: Gastroenterology

## 2019-02-15 VITALS — BP 130/80 | HR 86 | Temp 98.2°F | Ht 61.5 in | Wt 150.0 lb

## 2019-02-15 DIAGNOSIS — K219 Gastro-esophageal reflux disease without esophagitis: Secondary | ICD-10-CM

## 2019-02-15 MED ORDER — PANTOPRAZOLE SODIUM 40 MG PO TBEC: 40.0000 mg | DELAYED_RELEASE_TABLET | Freq: Two times a day (BID) | ORAL | 3 refills | Status: DC

## 2019-02-15 NOTE — Progress Notes (Signed)
Review of pertinent gastrointestinal problems: 1.  GERD mild gastritis.  EGD December 2020 done for odynophagia and heartburn showed mild gastritis.  Biopsies were negative for H. Pylori.  At the time of her EGD I recommended that she take Protonix every morning and famotidine at bedtime every night.  I encouraged her to continue avoiding caffeine after around noon.  HPI: This is a very pleasant 73 year old woman whom I last saw at the time of her EGD about 2 weeks ago.  We reviewed the results of the pathology results as well.  Since then she really cut back even further on her caffeine and also tried stopping meloxicam.  Unfortunately when she stopped the meloxicam for just a few days her hand pains became pretty severe even working up into her wrists.  This is a very debilitating pain for her and so she resumed meloxicam.  She is overall definitely improved since her proton pump inhibitor every morning and H2 blocker every bedtime.  The chest spasms have resolved.  She still is bothered a bit by the feeling of food going slowly in her esophagus however.  She tells me she turned in a Cologuard test that was sent to her by her insurance company recently.  She is not sure of the result  Chief complaint is GERD, family history colon cancer  ROS: complete GI ROS as described in HPI, all other review negative.  Constitutional:  No unintentional weight loss   Past Medical History:  Diagnosis Date  . Allergy    post nasal drip  . Arthritis   . Blood transfusion without reported diagnosis    with hysterectomy 11 years ago  . GERD (gastroesophageal reflux disease)   . Glaucoma    bilateral eyes  . History of colonic polyps   . Melanoma of thigh ([place])   . Osteoporosis     Past Surgical History:  Procedure Laterality Date  . Colon polyps removed    . Melanoma removed     R thigh  . TOTAL ABDOMINAL HYSTERECTOMY W/ BILATERAL SALPINGOOPHORECTOMY     uterine fibroids    Current Outpatient  Medications  Medication Sig Dispense Refill  . aspirin 81 MG tablet Take 81 mg by mouth daily.    [name] atorvastatin (LIPITOR) 10 MG tablet Take 1 tablet (10 mg total) by mouth daily. 90 tablet 3  . Calcium Citrate-Vitamin D (CITRACAL/VITAMIN D) 250-200 MG-UNIT TABS Take 2 tablets by mouth at bedtime.     . Coenzyme Q10 (CO Q-10 PO) Take 1 capsule by mouth every morning.    . conjugated estrogens (PREMARIN) vaginal cream Place 1.5 g vaginally once a week. Take one tablet by mouth on Sundays per patient    . ezetimibe (ZETIA) 10 MG tablet Take 5 mg by mouth daily.    . famotidine (PEPCID) 20 MG tablet Take one tablet after dinner 30 tablet 3  . GLUCOSAMINE CHONDROITIN COMPLX PO Take 2 tablets by mouth daily.     . meloxicam (MOBIC) 15 MG tablet Take 1 tablet by mouth once daily 30 tablet 0  . Multiple Vitamins-Minerals (ALIVE WOMENS 50+ PO) Take 1 tablet by mouth every morning.    [name] OVER THE COUNTER MEDICATION Take 1 tablet by mouth daily. Tumeric and cumin     . pantoprazole (PROTONIX) 40 MG tablet Take 1 tablet by mouth once daily 90 tablet 0  . raloxifene (EVISTA) 60 MG tablet Take 60 mg by mouth daily.    . timolol (BETIMOL) 0.5 %  ophthalmic solution Place 1 drop into the left eye 2 (two) times daily.    . TRAVATAN Z 0.004 % SOLN ophthalmic solution Place 1 drop into both eyes at bedtime.   1   No current facility-administered medications for this visit.    Allergies as of 02/15/2019 - Review Complete 02/09/2019  Allergen Reaction Noted  . Amoxicillin Hives     Family History  Problem Relation Age of Onset  . Hypertension Mother   . Cancer Mother        breast,anal  . Osteoporosis Mother   . Breast cancer Mother 21  . Rectal cancer Mother   . CAD Sister   . Hypothyroidism Sister   . Colon polyps Neg Hx   . Esophageal cancer Neg Hx   . Stomach cancer Neg Hx     Social History   Socioeconomic History  . Marital status: Married    Spouse name: Not on file  . Number of  children: Not on file  . Years of education: Not on file  . Highest education level: Not on file  Occupational History  . Not on file  Tobacco Use  . Smoking status: Never Smoker  . Smokeless tobacco: Never Used  Substance and Sexual Activity  . Alcohol use: Yes    Comment:  occasionally  . Drug use: No  . Sexual activity: Not on file  Other Topics Concern  . Not on file  Social History Narrative  . Not on file   Social Determinants of Health   Financial Resource Strain:   . Difficulty of Paying Living Expenses: Not on file  Food Insecurity:   . Worried About Charity fundraiser in the Last Year: Not on file  . Ran Out of Food in the Last Year: Not on file  Transportation Needs:   . Lack of Transportation (Medical): Not on file  . Lack of Transportation (Non-Medical): Not on file  Physical Activity:   . Days of Exercise per Week: Not on file  . Minutes of Exercise per Session: Not on file  Stress:   . Feeling of Stress : Not on file  Social Connections:   . Frequency of Communication with Friends and Family: Not on file  . Frequency of Social Gatherings with Friends and Family: Not on file  . Attends Religious Services: Not on file  . Active Member of Clubs or Organizations: Not on file  . Attends Archivist Meetings: Not on file  . Marital Status: Not on file  Intimate Partner Violence:   . Fear of Current or Ex-Partner: Not on file  . Emotionally Abused: Not on file  . Physically Abused: Not on file  . Sexually Abused: Not on file     Physical Exam: BP 130/80 (BP Location: Left Arm)   Pulse 86   Temp 98.2 F (36.8 C)   Ht 5' 1.5" (1.562 m)   Wt 150 lb (68 kg)   SpO2 98%   BMI 27.88 kg/m  Constitutional: generally well-appearing Psychiatric: alert and oriented x3 Abdomen: soft, nontender, nondistended, no obvious ascites, no peritoneal signs, normal bowel sounds No peripheral edema noted in lower extremities  Assessment and plan: 73 y.o.  female with GERD, family history of colon cancer  A lot of her esophageal symptoms have improved on proton pump inhibitor in the morning and H2 blocker at bedtime every night.  She still bothered by sort of dysphagia or odynophagia and a little bit of pyrosis.  Going  to try doubling her proton pump inhibitor to see if that helps with these residual symptoms.  We will call her in Protonix 40 mg, 1 pill twice daily instead of once daily with a month and 11 refills.  She will call to report on her response in 6 weeks  Document her family history of colon cancer.  Her insurance company sent her Cologuard test which she completed and mailed in.  She has not heard the results of that.  I explained to her that we should have sent that to her given her family history of colon cancer.  If it is negative however I think it is probably safe for her to wait 3 years before considering repeat colon cancer screening.  Please see the "Patient Instructions" section for addition details about the plan.  Owens Loffler, MD [facility] 02/15/2019, 2:36 PM

## 2019-02-15 NOTE — Patient Instructions (Signed)
If you are age 74 or older, your body mass index should be between 23-30. Your Body mass index is 27.88 kg/m. If this is out of the aforementioned range listed, please consider follow up with your Primary Care Provider.  We have sent the following medications to your pharmacy for you to pick up at your convenience: Pantoprazole  1. Increase pantoprazole 40mg  to twice daily.   2.Continue your Pepcid once at bedtime.   3. Call office in 6 weeks to let us know how your doing.   Thank you for choosing me and [facility].  Dr. Ardis Hughs

## 2019-02-20 ENCOUNTER — Other Ambulatory Visit: Payer: Self-pay | Admitting: Family Medicine

## 2019-02-21 ENCOUNTER — Encounter: Payer: Self-pay | Admitting: Family Medicine

## 2019-02-23 ENCOUNTER — Other Ambulatory Visit (INDEPENDENT_AMBULATORY_CARE_PROVIDER_SITE_OTHER): Payer: Medicare Other

## 2019-02-23 DIAGNOSIS — E785 Hyperlipidemia, unspecified: Secondary | ICD-10-CM | POA: Diagnosis not present

## 2019-02-23 LAB — LIPID PANEL
Cholesterol: 113 mg/dL (ref 0–200)
HDL: 50.2 mg/dL (ref >39.00)
LDL Cholesterol: 49 mg/dL (ref 0–99)
NonHDL: 62.84
Total CHOL/HDL Ratio: 2
Triglycerides: 69 mg/dL (ref 0.0–149.0)
VLDL: 13.8 mg/dL (ref 0.0–40.0)

## 2019-02-23 LAB — BASIC METABOLIC PANEL
BUN: 18 mg/dL (ref 6–23)
CO2: 29 mEq/L (ref 19–32)
Calcium: 9.2 mg/dL (ref 8.4–10.5)
Chloride: 104 mEq/L (ref 96–112)
Creatinine, Ser: 0.98 mg/dL (ref 0.40–1.20)
GFR: 55.45 mL/min — ABNORMAL LOW (ref >60.00)
Glucose, Bld: 131 mg/dL — ABNORMAL HIGH (ref 70–99)
Potassium: 3.9 mEq/L (ref 3.5–5.1)
Sodium: 139 mEq/L (ref 135–145)

## 2019-02-23 LAB — HEPATIC FUNCTION PANEL
ALT: 16 U/L (ref 0–35)
AST: 22 U/L (ref 0–37)
Albumin: 4.1 g/dL (ref 3.5–5.2)
Alkaline Phosphatase: 48 U/L (ref 39–117)
Bilirubin, Direct: 0.1 mg/dL (ref 0.0–0.3)
Total Bilirubin: 0.5 mg/dL (ref 0.2–1.2)
Total Protein: 6.2 g/dL (ref 6.0–8.3)

## 2019-02-23 LAB — TSH: TSH: 2.07 u[IU]/mL (ref 0.35–4.50)

## 2019-03-02 DIAGNOSIS — H5711 Ocular pain, right eye: Secondary | ICD-10-CM | POA: Diagnosis not present

## 2019-03-02 DIAGNOSIS — S0501XA Injury of conjunctiva and corneal abrasion without foreign body, right eye, initial encounter: Secondary | ICD-10-CM | POA: Diagnosis not present

## 2019-03-02 DIAGNOSIS — H16221 Keratoconjunctivitis sicca, not specified as Sjogren's, right eye: Secondary | ICD-10-CM | POA: Diagnosis not present

## 2019-03-02 DIAGNOSIS — H401111 Primary open-angle glaucoma, right eye, mild stage: Secondary | ICD-10-CM | POA: Diagnosis not present

## 2019-03-03 DIAGNOSIS — M8589 Other specified disorders of bone density and structure, multiple sites: Secondary | ICD-10-CM | POA: Diagnosis not present

## 2019-03-03 DIAGNOSIS — M818 Other osteoporosis without current pathological fracture: Secondary | ICD-10-CM | POA: Diagnosis not present

## 2019-03-03 DIAGNOSIS — Z780 Asymptomatic menopausal state: Secondary | ICD-10-CM | POA: Diagnosis not present

## 2019-03-03 LAB — HM DEXA SCAN

## 2019-03-08 ENCOUNTER — Encounter: Payer: Self-pay | Admitting: General Practice

## 2019-03-14 ENCOUNTER — Other Ambulatory Visit: Payer: Self-pay | Admitting: Family Medicine

## 2019-03-16 NOTE — Progress Notes (Signed)
Cardiology Office Note   Date:  03/21/2019   ID:  Denise Ewing, DOB [birth year], MRN [id number]  PCP:  [name], MD  Cardiologist:   Dorris Carnes, MD   FU of HL and CAD      History of Present Illness: Denise Ewing is a 74 y.o. female with a history of atypical CP, CAD by CT angiogram (Stenosis 50% or less LAD; calcium score 126) She was seen in ED on 12/16/18 for CP   R/O for MI  Sent home    Seen by B Bhagat on 10/29  Continued pain  Felt GI as responed to carafate   Last lipids in June 2020 LDL 52    The pt says she is doing good   Denies CP   Breathing is OK      Current Meds  Medication Sig  . aspirin 81 MG tablet Take 81 mg by mouth daily.  [name] atorvastatin (LIPITOR) 10 MG tablet Take 1 tablet (10 mg total) by mouth daily.  . Calcium Citrate-Vitamin D (CITRACAL/VITAMIN D) 250-200 MG-UNIT TABS Take 2 tablets by mouth at bedtime.   . Coenzyme Q10 (CO Q-10 PO) Take 1 capsule by mouth every morning.  . conjugated estrogens (PREMARIN) vaginal cream Place 1.5 g vaginally once a week. Take one tablet by mouth on Sundays per patient  . ezetimibe (ZETIA) 10 MG tablet Take 5 mg by mouth daily.  . famotidine (PEPCID) 20 MG tablet Take one tablet after dinner  . GLUCOSAMINE CHONDROITIN COMPLX PO Take 2 tablets by mouth daily.   . meloxicam (MOBIC) 15 MG tablet Take 1 tablet by mouth once daily  . Multiple Vitamins-Minerals (ALIVE WOMENS 50+ PO) Take 1 tablet by mouth every morning.  [name] OVER THE COUNTER MEDICATION Take 1 tablet by mouth daily. Tumeric and cumin   . pantoprazole (PROTONIX) 40 MG tablet Take 1 tablet (40 mg total) by mouth 2 (two) times daily before a meal.  . raloxifene (EVISTA) 60 MG tablet Take 1 tablet by mouth once daily  . timolol (BETIMOL) 0.5 % ophthalmic solution Place 1 drop into the left eye 2 (two) times daily.  . TRAVATAN Z 0.004 % SOLN ophthalmic solution Place 1 drop into both eyes at bedtime.      Allergies:   Amoxicillin   Past Medical History:   Diagnosis Date  . Allergy    post nasal drip  . Arthritis   . Blood transfusion without reported diagnosis    with hysterectomy 11 years ago  . GERD (gastroesophageal reflux disease)   . Glaucoma    bilateral eyes  . History of colonic polyps   . Melanoma of thigh ([place])   . Osteoporosis     Past Surgical History:  Procedure Laterality Date  . Colon polyps removed    . Melanoma removed     R thigh  . TOTAL ABDOMINAL HYSTERECTOMY W/ BILATERAL SALPINGOOPHORECTOMY     uterine fibroids     Social History:  The patient  reports that she has never smoked. She has never used smokeless tobacco. She reports current alcohol use. She reports that she does not use drugs.   Family History:  The patient's family history includes Breast cancer (age of onset: 38) in her mother; CAD in her sister; Cancer in her mother; Hypertension in her mother; Hypothyroidism in her sister; Osteoporosis in her mother; Rectal cancer in her mother.    ROS:  Please see the history of present illness. All other systems  are reviewed and  Negative to the above problem except as noted.    PHYSICAL EXAM: VS:  BP 126/68   Pulse 69   Ht 5' 1.5" (1.562 m)   Wt 153 lb 3.2 oz (69.5 kg)   SpO2 98%   BMI 28.48 kg/m   GEN: Overweight 74 yo in no acute distress  HEENT: normal  Neck: JVP is normal  No, carotid bruits Cardiac: RRR; no murmurs, rubs, or gallops,no LE   edema  Respiratory:  clear to auscultation bilaterally  GI: soft, nontender, nondistended, + BS  No hepatomegaly  MS: no deformity Moving all extremities   Skin: warm and dry, no rash Neuro:  Strength and sensation are intact Psych: euthymic mood, full affect   EKG:  EKG is not ordered today.   Lipid Panel    Component Value Date/Time   CHOL 113 02/23/2019 0937   CHOL 110 04/10/2016 1143   CHOL 96 (L) 12/21/2014 0824   TRIG 69.0 02/23/2019 0937   TRIG 66 12/21/2014 0824   HDL 50.20 02/23/2019 0937   HDL 54 04/10/2016 1143   HDL 45 (L)  12/21/2014 0824   CHOLHDL 2 02/23/2019 0937   VLDL 13.8 02/23/2019 0937   LDLCALC 49 02/23/2019 0937   LDLCALC 45 04/10/2016 1143   LDLCALC 38 12/21/2014 0824      Wt Readings from Last 3 Encounters:  03/21/19 153 lb 3.2 oz (69.5 kg)  02/15/19 150 lb (68 kg)  02/09/19 148 lb (67.1 kg)      ASSESSMENT AND PLAN:   1  CAD Pt with mild dz  Continue to monitor risk factors    2  HL  Last lipids LDL 49  HDL 50    3  HTN   BP is controlled   Continue to follow   4  GI   Pt doing well on current regimen of Protonix and Pepcid at night   Follows by Rande Brunt    Encouraged her to stay active. F/U in Dec 2021    Current medicines are reviewed at length with the patient today.  The patient does not have concerns regarding medicines.  Signed, Dorris Carnes, MD  03/21/2019 9:43 AM    [address], [place], [place]  [address] Phone: [phone]; Fax: [phone]

## 2019-03-18 DIAGNOSIS — H401123 Primary open-angle glaucoma, left eye, severe stage: Secondary | ICD-10-CM | POA: Diagnosis not present

## 2019-03-18 DIAGNOSIS — H04123 Dry eye syndrome of bilateral lacrimal glands: Secondary | ICD-10-CM | POA: Diagnosis not present

## 2019-03-18 DIAGNOSIS — H401111 Primary open-angle glaucoma, right eye, mild stage: Secondary | ICD-10-CM | POA: Diagnosis not present

## 2019-03-21 ENCOUNTER — Ambulatory Visit (INDEPENDENT_AMBULATORY_CARE_PROVIDER_SITE_OTHER): Payer: Medicare Other | Admitting: Internal Medicine

## 2019-03-21 ENCOUNTER — Other Ambulatory Visit: Payer: Self-pay

## 2019-03-21 VITALS — BP 126/68 | HR 69 | Ht 61.5 in | Wt 153.2 lb

## 2019-03-21 DIAGNOSIS — I10 Essential (primary) hypertension: Secondary | ICD-10-CM | POA: Diagnosis not present

## 2019-03-21 DIAGNOSIS — E782 Mixed hyperlipidemia: Secondary | ICD-10-CM

## 2019-03-21 DIAGNOSIS — I2510 Atherosclerotic heart disease of native coronary artery without angina pectoris: Secondary | ICD-10-CM | POA: Diagnosis not present

## 2019-03-21 NOTE — Patient Instructions (Signed)
Medication Instructions:  No changes *If you need a refill on your cardiac medications before your next appointment, please call your pharmacy*  Lab Work: none If you have labs (blood work) drawn today and your tests are completely normal, you will receive your results only by: [name] MyChart Message (if you have MyChart) OR . A paper copy in the mail If you have any lab test that is abnormal or we need to change your treatment, we will call you to review the results.  Testing/Procedures: none  Follow-Up: At [facility], you and your health needs are our priority.  As part of our continuing mission to provide you with exceptional heart care, we have created designated Provider Care Teams.  These Care Teams include your primary Cardiologist (physician) and Advanced Practice Providers (APPs -  Physician Assistants and Nurse Practitioners) who all work together to provide you with the care you need, when you need it.  Your next appointment:   11 month(s)  The format for your next appointment:   Either In Person or Virtual  Provider:   You may see Dorris Carnes, MD or one of the following Advanced Practice Providers on your designated Care Team:    Richardson Dopp, PA-C  [place], [place]  Daune Perch, NP   Other Instructions

## 2019-03-27 ENCOUNTER — Ambulatory Visit: Payer: Medicare Other

## 2019-03-28 DIAGNOSIS — L821 Other seborrheic keratosis: Secondary | ICD-10-CM | POA: Diagnosis not present

## 2019-03-28 DIAGNOSIS — Z85820 Personal history of malignant melanoma of skin: Secondary | ICD-10-CM | POA: Diagnosis not present

## 2019-03-28 DIAGNOSIS — Z08 Encounter for follow-up examination after completed treatment for malignant neoplasm: Secondary | ICD-10-CM | POA: Diagnosis not present

## 2019-04-01 ENCOUNTER — Encounter: Payer: Self-pay | Admitting: Family Medicine

## 2019-04-01 ENCOUNTER — Ambulatory Visit: Payer: Medicare Other | Attending: Internal Medicine

## 2019-04-01 DIAGNOSIS — Z23 Encounter for immunization: Secondary | ICD-10-CM

## 2019-04-01 MED ORDER — TIZANIDINE HCL 4 MG PO TABS: 4.0000 mg | ORAL_TABLET | Freq: Three times a day (TID) | ORAL | 0 refills | Status: DC | PRN

## 2019-04-01 NOTE — Progress Notes (Signed)
   Covid-19 Vaccination Clinic  Name:  Denise Ewing    MRN: [id number] DOB: 17-Mar-1945  04/01/2019  [name] was observed post Covid-19 immunization for 15 minutes without incidence. She was provided with Vaccine Information Sheet and instruction to access the V-Safe system.   [name] was instructed to call 911 with any severe reactions post vaccine: [name] Difficulty breathing  . Swelling of your face and throat  . A fast heartbeat  . A bad rash all over your body  . Dizziness and weakness    Immunizations Administered    Name Date Dose VIS Date Route   Pfizer COVID-19 Vaccine 04/01/2019 10:51 AM 0.3 mL 02/04/2019 Intramuscular   Manufacturer: [place]   Lot: [id number]   [place]: [id number]

## 2019-04-03 ENCOUNTER — Encounter: Payer: Self-pay | Admitting: Family Medicine

## 2019-04-04 DIAGNOSIS — M542 Cervicalgia: Secondary | ICD-10-CM | POA: Diagnosis not present

## 2019-04-07 ENCOUNTER — Ambulatory Visit: Payer: Medicare Other

## 2019-04-08 DIAGNOSIS — M5012 Mid-cervical disc disorder, unspecified level: Secondary | ICD-10-CM | POA: Diagnosis not present

## 2019-04-08 DIAGNOSIS — M6281 Muscle weakness (generalized): Secondary | ICD-10-CM | POA: Diagnosis not present

## 2019-04-12 ENCOUNTER — Other Ambulatory Visit: Payer: Self-pay | Admitting: Family Medicine

## 2019-04-13 DIAGNOSIS — M542 Cervicalgia: Secondary | ICD-10-CM | POA: Diagnosis not present

## 2019-04-13 DIAGNOSIS — M4722 Other spondylosis with radiculopathy, cervical region: Secondary | ICD-10-CM | POA: Diagnosis not present

## 2019-04-19 DIAGNOSIS — M542 Cervicalgia: Secondary | ICD-10-CM | POA: Diagnosis not present

## 2019-04-22 DIAGNOSIS — M4722 Other spondylosis with radiculopathy, cervical region: Secondary | ICD-10-CM | POA: Diagnosis not present

## 2019-04-22 DIAGNOSIS — M542 Cervicalgia: Secondary | ICD-10-CM | POA: Diagnosis not present

## 2019-04-26 ENCOUNTER — Ambulatory Visit: Payer: Medicare Other | Attending: Internal Medicine

## 2019-04-26 DIAGNOSIS — Z23 Encounter for immunization: Secondary | ICD-10-CM | POA: Insufficient documentation

## 2019-04-26 NOTE — Progress Notes (Signed)
   Covid-19 Vaccination Clinic  Name:  Denise Ewing    MRN: [id number] DOB: Jun 24, 1945  04/26/2019  [name] was observed post Covid-19 immunization for 15 minutes without incident. She was provided with Vaccine Information Sheet and instruction to access the V-Safe system.   [name] was instructed to call 911 with any severe reactions post vaccine: [name] Difficulty breathing  . Swelling of face and throat  . A fast heartbeat  . A bad rash all over body  . Dizziness and weakness   Immunizations Administered    Name Date Dose VIS Date Route   Pfizer COVID-19 Vaccine 04/26/2019 10:19 AM 0.3 mL 02/04/2019 Intramuscular   Manufacturer: [place]   Lot: [id number]   [place]: [id number]

## 2019-05-05 DIAGNOSIS — M7918 Myalgia, other site: Secondary | ICD-10-CM | POA: Diagnosis not present

## 2019-05-05 DIAGNOSIS — M5030 Other cervical disc degeneration, unspecified cervical region: Secondary | ICD-10-CM | POA: Diagnosis not present

## 2019-05-05 DIAGNOSIS — M47812 Spondylosis without myelopathy or radiculopathy, cervical region: Secondary | ICD-10-CM | POA: Diagnosis not present

## 2019-05-05 DIAGNOSIS — G894 Chronic pain syndrome: Secondary | ICD-10-CM | POA: Diagnosis not present

## 2019-05-13 ENCOUNTER — Other Ambulatory Visit: Payer: Self-pay | Admitting: Family Medicine

## 2019-05-24 ENCOUNTER — Telehealth: Payer: Self-pay | Admitting: Family Medicine

## 2019-05-24 NOTE — Progress Notes (Signed)
  Chronic Care Management   Note  05/24/2019 Name: Denise Ewing MRN: [id number] DOB: 1945/04/23  Denise Ewing is a 74 y.o. year old female who is a primary care patient of Birdie Riddle, Aundra Millet, MD. I reached out to [employer] by phone today in response to a referral sent by Denise Ewing's PCP, [name], MD.   Denise Ewing was given information about Chronic Care Management services today including:  1. CCM service includes personalized support from designated clinical staff supervised by her physician, including individualized plan of care and coordination with other care providers 2. 24/7 contact phone numbers for assistance for urgent and routine care needs. 3. Service will only be billed when office clinical staff spend 20 minutes or more in a month to coordinate care. 4. Only one practitioner may furnish and bill the service in a calendar month. 5. The patient may stop CCM services at any time (effective at the end of the month) by phone call to the office staff.   Patient agreed to services and verbal consent obtained.   Follow up plan:   Earney Hamburg [employer] Scheduler

## 2019-05-26 ENCOUNTER — Ambulatory Visit: Payer: Medicare Other

## 2019-05-26 DIAGNOSIS — I2510 Atherosclerotic heart disease of native coronary artery without angina pectoris: Secondary | ICD-10-CM

## 2019-05-26 DIAGNOSIS — E785 Hyperlipidemia, unspecified: Secondary | ICD-10-CM

## 2019-05-26 NOTE — Patient Instructions (Addendum)
Visit Information  Goals Addressed            This Visit's Progress   . PharmD Care Plan       CARE PLAN ENTRY  Current Barriers:  . Chronic Disease Management support, education, and care coordination needs related to CAD and HLD  Pharmacist Clinical Goal(s):  [name] LDL <70 . Receive Tdap vaccine within 90 days  Interventions: . Comprehensive medication review performed.  Patient Self Care Activities:  . Patient verbalizes understanding of plan to get Tdap at local pharmacy  Initial goal documentation       Ms. Kibbe was given information about Chronic Care Management services today including:  1. CCM service includes personalized support from designated clinical staff supervised by her physician, including individualized plan of care and coordination with other care providers 2. 24/7 contact phone numbers for assistance for urgent and routine care needs. 3. Standard insurance, coinsurance, copays and deductibles apply for chronic care management only during months in which we provide at least 20 minutes of these services. Most insurances cover these services at 100%, however patients may be responsible for any copay, coinsurance and/or deductible if applicable. This service may help you avoid the need for more expensive face-to-face services. 4. Only one practitioner may furnish and bill the service in a calendar month. 5. The patient may stop CCM services at any time (effective at the end of the month) by phone call to the office staff.  Patient agreed to services and verbal consent obtained.   The patient verbalized understanding of instructions provided today and agreed to receive a mailed copy of patient instruction and/or educational materials. Telephone follow up appointment with pharmacy team member scheduled for: 10/1  Madelin Rear, Pharm.D. Clinical Pharmacist [facility] [phone]   High Cholesterol  High cholesterol is a  condition in which the blood has high levels of a white, waxy, fat-like substance (cholesterol). The human body needs small amounts of cholesterol. The liver makes all the cholesterol that the body needs. Extra (excess) cholesterol comes from the food that we eat. Cholesterol is carried from the liver by the blood through the blood vessels. If you have high cholesterol, deposits (plaques) may build up on the walls of your blood vessels (arteries). Plaques make the arteries narrower and stiffer. Cholesterol plaques increase your risk for heart attack and stroke. Work with your health care provider to keep your cholesterol levels in a healthy range. What increases the risk? This condition is more likely to develop in people who:  Eat foods that are high in animal fat (saturated fat) or cholesterol.  Are overweight.  Are not getting enough exercise.  Have a family history of high cholesterol. What are the signs or symptoms? There are no symptoms of this condition. How is this diagnosed? This condition may be diagnosed from the results of a blood test.  If you are older than age 11, your health care provider may check your cholesterol every 4-6 years.  You may be checked more often if you already have high cholesterol or other risk factors for heart disease. The blood test for cholesterol measures:  "Bad" cholesterol (LDL cholesterol). This is the main type of cholesterol that causes heart disease. The desired level for LDL is less than 100.  "Good" cholesterol (HDL cholesterol). This type helps to protect against heart disease by cleaning the arteries and carrying the LDL away. The desired level for HDL is 60 or higher.  Triglycerides.  These are fats that the body can store or burn for energy. The desired number for triglycerides is lower than 150.  Total cholesterol. This is a measure of the total amount of cholesterol in your blood, including LDL cholesterol, HDL cholesterol, and  triglycerides. A healthy number is less than 200. How is this treated? This condition is treated with diet changes, lifestyle changes, and medicines. Diet changes  This may include eating more whole grains, fruits, vegetables, nuts, and fish.  This may also include cutting back on red meat and foods that have a lot of added sugar. Lifestyle changes  Changes may include getting at least 40 minutes of aerobic exercise 3 times a week. Aerobic exercises include walking, biking, and swimming. Aerobic exercise along with a healthy diet can help you maintain a healthy weight.  Changes may also include quitting smoking. Medicines  Medicines are usually given if diet and lifestyle changes have failed to reduce your cholesterol to healthy levels.  Your health care provider may prescribe a statin medicine. Statin medicines have been shown to reduce cholesterol, which can reduce the risk of heart disease. Follow these instructions at home: Eating and drinking If told by your health care provider:  Eat chicken (without skin), fish, veal, shellfish, ground [place] breast, and round or loin cuts of red meat.  Do not eat fried foods or fatty meats, such as hot dogs and salami.  Eat plenty of fruits, such as apples.  Eat plenty of vegetables, such as broccoli, potatoes, and carrots.  Eat beans, peas, and lentils.  Eat grains such as barley, rice, couscous, and bulgur wheat.  Eat pasta without cream sauces.  Use skim or nonfat milk, and eat low-fat or nonfat yogurt and cheeses.  Do not eat or drink whole milk, cream, ice cream, egg yolks, or hard cheeses.  Do not eat stick margarine or tub margarines that contain trans fats (also called partially hydrogenated oils).  Do not eat saturated tropical oils, such as coconut oil and palm oil.  Do not eat cakes, cookies, crackers, or other baked goods that contain trans fats.  General instructions  Exercise as directed by your health care  provider. Increase your activity level with activities such as gardening, walking, and taking the stairs.  Take over-the-counter and prescription medicines only as told by your health care provider.  Do not use any products that contain nicotine or tobacco, such as cigarettes and e-cigarettes. If you need help quitting, ask your health care provider.  Keep all follow-up visits as told by your health care provider. This is important. Contact a health care provider if:  You are struggling to maintain a healthy diet or weight.  You need help to start on an exercise program.  You need help to stop smoking. Get help right away if:  You have chest pain.  You have trouble breathing. This information is not intended to replace advice given to you by your health care provider. Make sure you discuss any questions you have with your health care provider. Document Revised: 02/13/2017 Document Reviewed: 08/11/2015 Elsevier Patient Education  [place].

## 2019-05-26 NOTE — Progress Notes (Signed)
Chronic Care Management Pharmacy  Name: Denise Ewing   MRN: [id number]  DOB: [birth year]  Chief Complaint/HPI Denise Ewing,  74 y.o. , female presents for their Initial CCM visit with the clinical pharmacist via telephone.  PCP : Denise Minium, MD  Their chronic conditions include: CAD, HLD, GERD  Office Visits: -12/18 (Rx): neck pain;tizanidine 4mg  Q8H. -12/16: recent weight loss of 6 lbs due diet and exercise. Stop meloxicam and start diclofenac gel for hand/wrist pain.    Consult Visits: -3/11 (Neuro, Dr. Francesco Ewing): neck pain. 4/15 f/u. -1/25 (Cardio, Dr. Harrington Ewing): no CP, no medication changes. 11 month f/u. -12/22 (GI, Dr. Ardis Ewing): EGD showed mild gastritis, negative for H. Pylori. Protonix in am, famotidine every evening; avoid caffeine after around noon. Tried stopping meloxicam but resumed due to debilitating hand/wrist pain. -10/29 (Cardio, Denise Ewing): seen after 10/22 ED visit c/o CP. Likely GI as responded to carafate.  Outpatient Encounter Medications as of 05/26/2019  Medication Sig  . aspirin 81 MG tablet Take 81 mg by mouth daily.  [name] atorvastatin (LIPITOR) 10 MG tablet Take 1 tablet (10 mg total) by mouth daily.  . Calcium Citrate-Vitamin D (CITRACAL/VITAMIN D) 250-200 MG-UNIT TABS Take 2 tablets by mouth at bedtime.   . Coenzyme Q10 (CO Q-10 PO) Take 1 capsule by mouth every morning.  . conjugated estrogens (PREMARIN) vaginal cream Place 1.5 g vaginally once a week. Take one tablet by mouth on Sundays per patient  . ezetimibe (ZETIA) 10 MG tablet Take 5 mg by mouth daily.  . famotidine (PEPCID) 20 MG tablet Take one tablet after dinner  . GLUCOSAMINE CHONDROITIN COMPLX PO Take 2 tablets by mouth daily.   . meloxicam (MOBIC) 15 MG tablet Take 1 tablet by mouth once daily  . Multiple Vitamins-Minerals (ALIVE WOMENS 50+ PO) Take 1 tablet by mouth every morning.  [name] OVER THE COUNTER MEDICATION Take 1 tablet by mouth daily. Tumeric and cumin   . pantoprazole (PROTONIX) 40  MG tablet Take 1 tablet (40 mg total) by mouth 2 (two) times daily before a meal.  . raloxifene (EVISTA) 60 MG tablet Take 1 tablet by mouth once daily  . timolol (BETIMOL) 0.5 % ophthalmic solution Place 1 drop into both eyes 2 (two) times daily.   [name] tiZANidine (ZANAFLEX) 4 MG tablet Take 1 tablet (4 mg total) by mouth every 8 (eight) hours as needed for muscle spasms.  . TRAVATAN Z 0.004 % SOLN ophthalmic solution Place 1 drop into both eyes at bedtime.    No facility-administered encounter medications on file as of 05/26/2019.   Current Diagnosis/Assessment:  Goals Addressed            This Visit's Progress   . PharmD Care Plan       CARE PLAN ENTRY  Current Barriers:  . Chronic Disease Management support, education, and care coordination needs related to CAD and HLD  Pharmacist Clinical Goal(s):  [name] LDL <70 . Receive Tdap vaccine within 90 days  Interventions: . Comprehensive medication review performed.  Patient Self Care Activities:  . Patient verbalizes understanding of plan to get Tdap at local pharmacy  Initial goal documentation       Hyperlipidemia   Lipid Panel     Component Value Date/Time   CHOL 113 02/23/2019 0937   CHOL 110 04/10/2016 1143   CHOL 96 (L) 12/21/2014 0824   TRIG 69.0 02/23/2019 0937   TRIG 66 12/21/2014 0824   HDL 50.20 02/23/2019 0937   HDL 54  04/10/2016 1143   HDL 45 (L) 12/21/2014 0824   CHOLHDL 2 02/23/2019 0937   VLDL 13.8 02/23/2019 0937   LDLCALC 49 02/23/2019 0937   LDLCALC 45 04/10/2016 1143   LDLCALC 38 12/21/2014 0824   LABVLDL 11 04/10/2016 1143  LDL at goal <70. Patient is currently controlled on Zetia 10 mg and atorvastatin 10 mg. Chronic Hx of use for each. No report of abdominal pain or n/v. Also taking asa 81 mg for CVD prevention. No side effects such as abnormal bruising, abnormal bleeding from nose or gums, blood in urine or stool.  Plan Continue current medications in combination with exercise and diet.     GERD  Currently taking Protonix 40 mg every morning and famotidine 20 mg nightly. Symptoms of acid reflux are currently well controlled with no recent episode. Drinking coffee throughout the day and eating chocolate are known triggers to patient and are avoided. Denies drinking coffee or any caffeine at all after 12pm.   Counseled on acid suppressive therapies and discussed NSAID-related GI effects at length.  Plan Continue current medications and avoidance of triggers.  Chronic Pain  Currently taking meloxicam 15 mg daily. Has prescription for tizanidine 4mg , uses infrequently because it did not significantly help with pain. In addition to arthritis-related hand/wrist pain, the neck area has become increasingly painful. At time of telephone visit, patient was using a heating pad on neck to help with pain. Will receive first ablation Monday at pain clinic.  Plan Continue current medications.    Vaccines  Reviewed and discussed patient's vaccination history. Last Tdap >10 years ago. Several weeks have passed since receiving 2nd COVID vaccine, should consider Tdap.  Immunization History  Administered Date(s) Administered  . Influenza Split 12/16/2011  . Influenza Whole 12/05/2009, 11/04/2010  . Influenza, High Dose Seasonal PF 11/30/2012, 10/30/2018  . Influenza,inj,Quad PF,6+ Mos 11/15/2013, 11/28/2014, 10/15/2015, 12/16/2016, 12/03/2017  . Influenza-Unspecified 10/25/2012  . PFIZER SARS-COV-2 Vaccination 04/01/2019, 04/26/2019  . Pneumococcal Conjugate-13 01/11/2014  . Pneumococcal Polysaccharide-23 09/05/2011  . Tdap 02/09/2007  . Zoster Recombinat (Shingrix) 08/22/2017, 10/30/2017   Plan Recommended receiving Tdap vaccine at local pharmacy. ______________ Visit Information Denise Ewing was given information about Chronic Care Management services today including:  1. CCM service includes personalized support from designated clinical staff supervised by her physician, including  individualized plan of care and coordination with other care providers 2. 24/7 contact phone numbers for assistance for urgent and routine care needs. 3. Standard insurance, coinsurance, copays and deductibles apply for chronic care management only during months in which we provide at least 20 minutes of these services. Most insurances cover these services at 100%, however patients may be responsible for any copay, coinsurance and/or deductible if applicable. This service may help you avoid the need for more expensive face-to-face services. 4. Only one practitioner may furnish and bill the service in a calendar month. 5. The patient may stop CCM services at any time (effective at the end of the month) by phone call to the office staff.  Patient agreed to services and verbal consent obtained.   Madelin Rear, Pharm.D. Clinical Pharmacist [facility] at [facility] [phone]

## 2019-05-30 DIAGNOSIS — M47812 Spondylosis without myelopathy or radiculopathy, cervical region: Secondary | ICD-10-CM | POA: Diagnosis not present

## 2019-05-31 ENCOUNTER — Other Ambulatory Visit: Payer: Self-pay | Admitting: Family Medicine

## 2019-05-31 DIAGNOSIS — I2510 Atherosclerotic heart disease of native coronary artery without angina pectoris: Secondary | ICD-10-CM

## 2019-05-31 DIAGNOSIS — E785 Hyperlipidemia, unspecified: Secondary | ICD-10-CM

## 2019-05-31 IMAGING — MR MR CERVICAL SPINE WO/W CM
5 of 8 series · 26 of 48 positions shown · IV contrast (multihance)
Comparison: None.

CLINICAL DATA: Numbness of the left face, arm and both legs. One
year duration. History of melanoma.

EXAM:
MRI CERVICAL SPINE WITHOUT AND WITH CONTRAST
TECHNIQUE: Multiplanar and multiecho pulse sequences of the cervical spine, to
include the craniocervical junction and cervicothoracic junction,
were obtained without and with intravenous contrast.
CONTRAST:  13mL MULTIHANCE GADOBENATE DIMEGLUMINE 529 MG/ML IV SOLN

[Series 3: T1 · sagittal · 3.0mm · 0.41mm/px · 4 of 15 slices shown (1 of 2)]
[im 1/15]
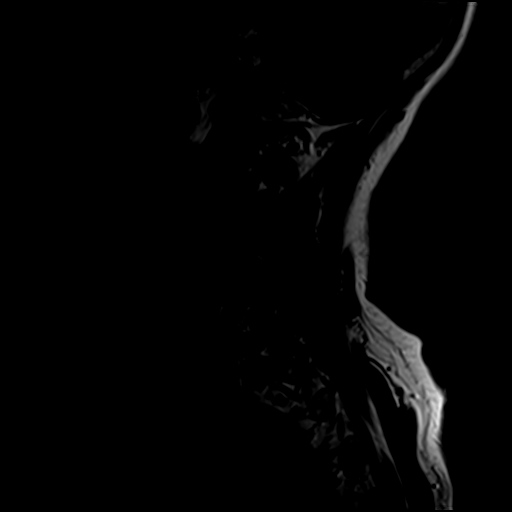
[im 5/15]
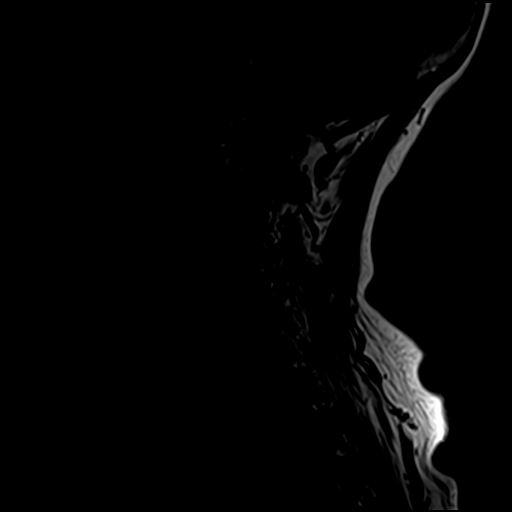
[im 10/15]
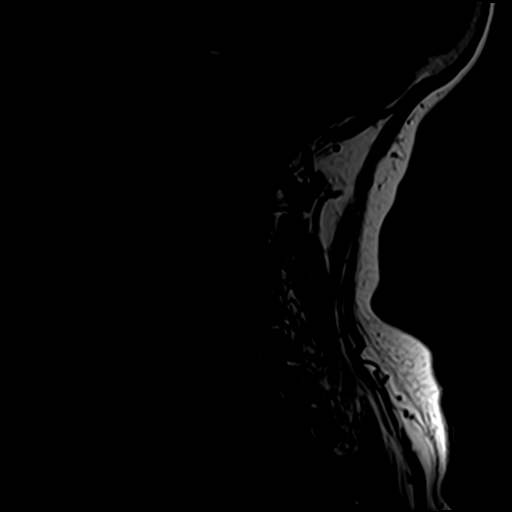
[im 15/15]
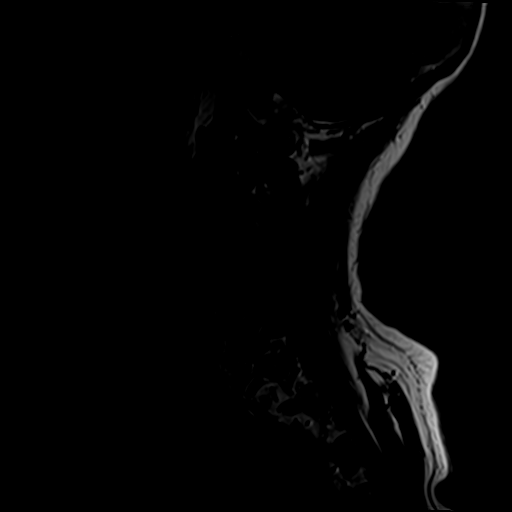

[Series 5: T2 · axial · 3.0mm · 0.70mm/px · z∈[-106,-22]mm · 8 of 25 slices shown (1 of 2)]
[im 1/25]
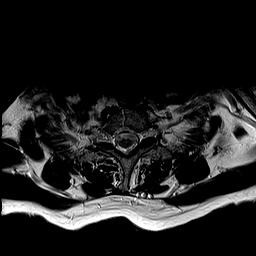
[im 4/25]
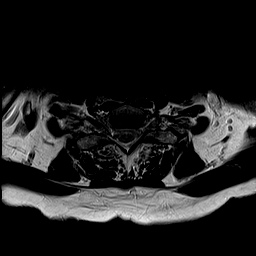
[im 7/25]
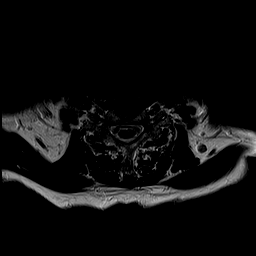
[im 11/25]
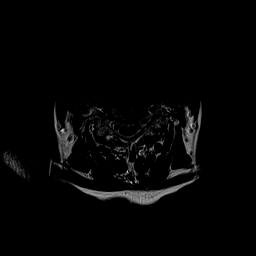
[im 14/25]
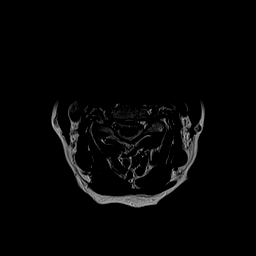
[im 18/25]
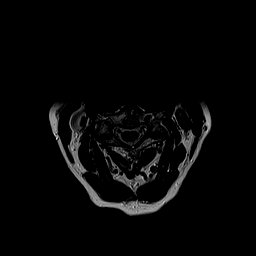
[im 21/25]
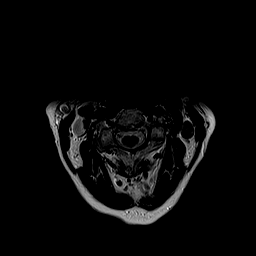
[im 25/25]
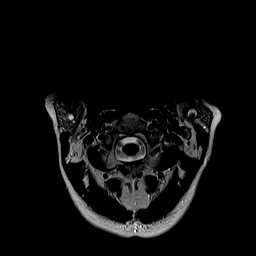

[Series 6: T1 · axial · 3.0mm · 0.35mm/px · z∈[-106,-22]mm · 8 of 25 slices shown (2 of 2)]
[im 1/25]
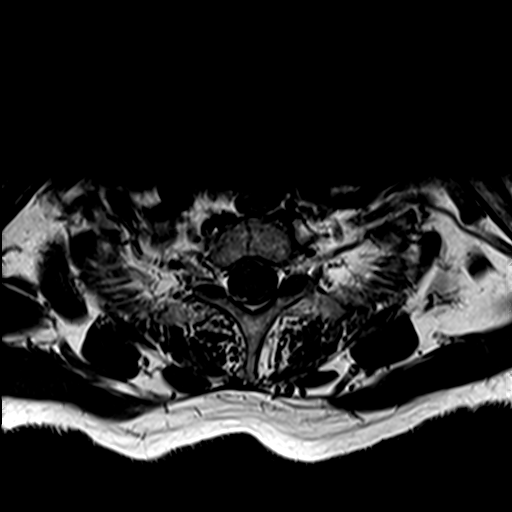
[im 4/25]
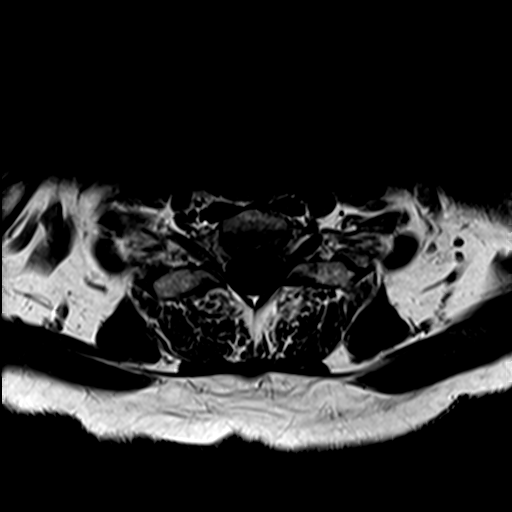
[im 7/25]
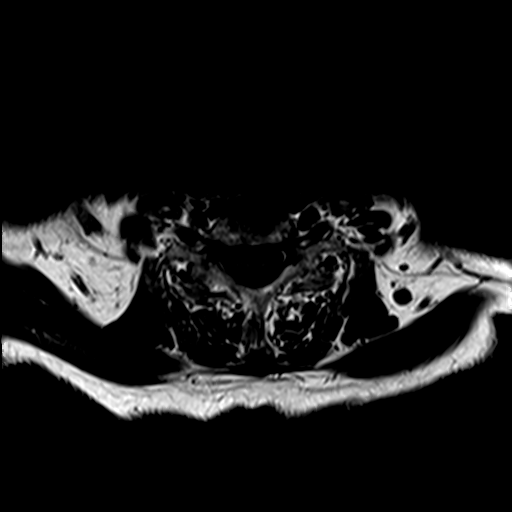
[im 11/25]
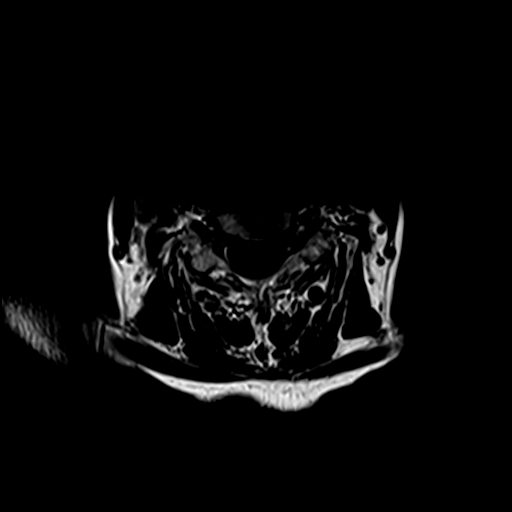
[im 14/25]
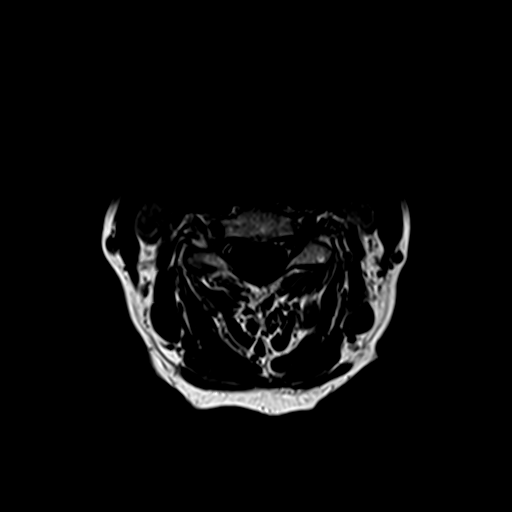
[im 18/25]
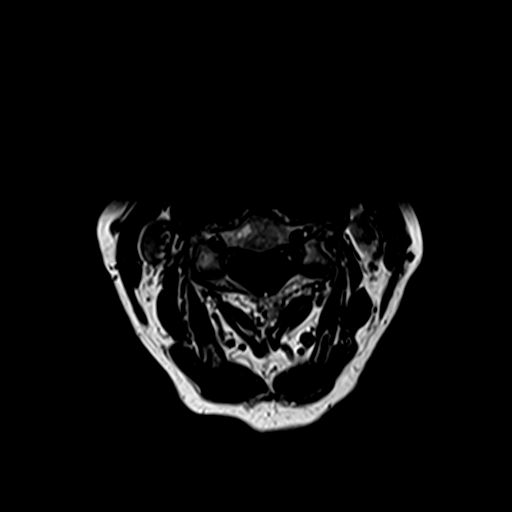
[im 21/25]
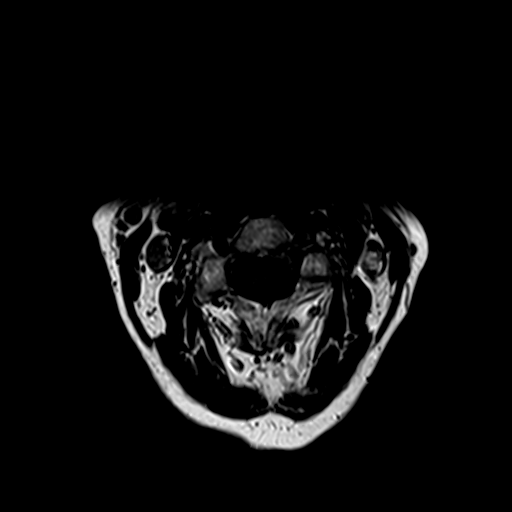
[im 25/25]
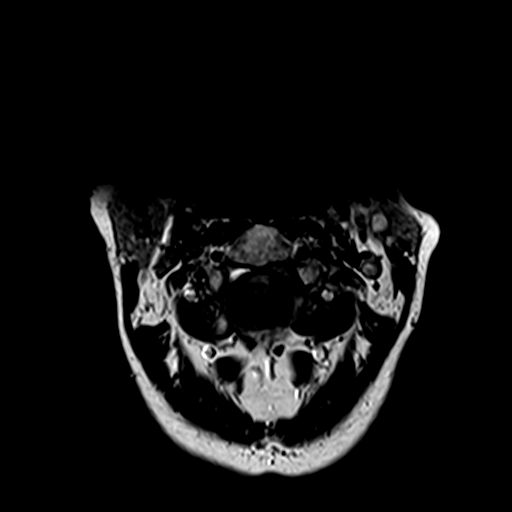

[Series 7: T2 · sagittal · 3.0mm · 0.41mm/px · 4 of 15 slices shown (2 of 2)]
[im 1/15]
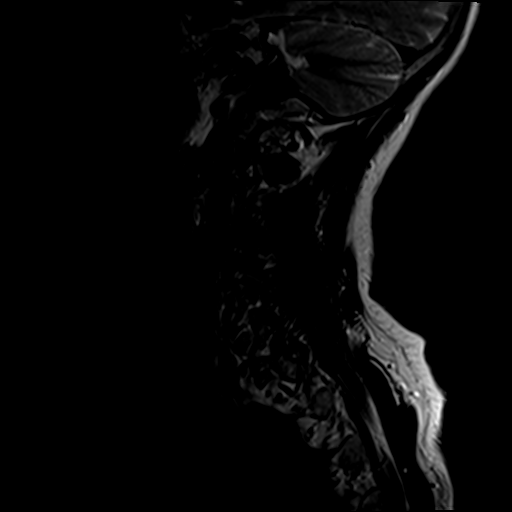
[im 5/15]
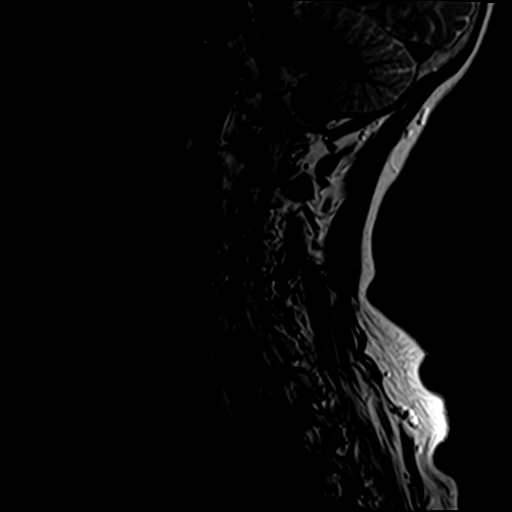
[im 10/15]
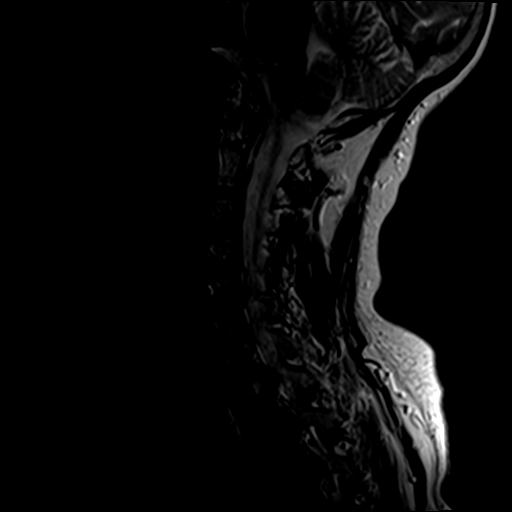
[im 15/15]
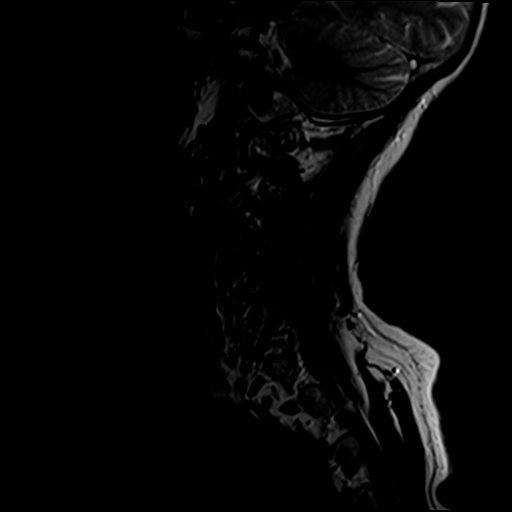

[Series 8: T1 fat-sat post-contrast · sagittal · 3.0mm · 0.82mm/px · 2 of 15 slices shown]
[im 1/15]
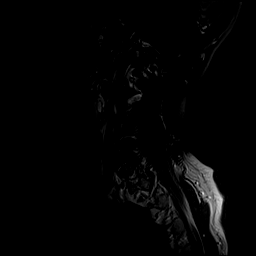
[im 5/15]
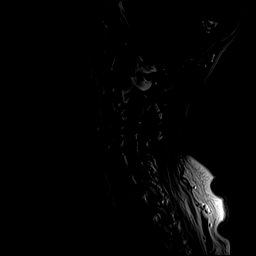

[26 of 48 positions shown; findings below may reference images not displayed]

FINDINGS: Alignment: Normal

Vertebrae: No fracture or primary bone lesion.

Cord: No cord compression or primary cord lesion. No abnormal
contrast enhancement.

Posterior Fossa, vertebral arteries, paraspinal tissues: Mild
chronic appearing small vessel change of the pons.

Disc levels:

Ordinary osteoarthritis at the C1-2 articulation without
encroachment upon the neural spaces.

C2-3 is normal.

C3-4:  Small central disc bulge.  No stenosis.

C4-5: Small endplate osteophytes and mild bulging of the disc. No
stenosis.

C5-6: Left-sided predominant osteophyte and bulging disc with
foraminal encroachment that could affect the left C6 nerve. No
central canal compromise.

C6-7:  Minimal disc bulge.  No stenosis.

C7-T1:  Normal interspace.
IMPRESSION: Left foraminal stenosis at C5-6 because of encroachment by
osteophyte and bulging disc material could compress the left C6
nerve.

No central canal stenosis or primary cord lesion.

Minimal degenerative changes elsewhere as described.

## 2019-06-02 ENCOUNTER — Encounter: Payer: Self-pay | Admitting: Family Medicine

## 2019-06-02 ENCOUNTER — Telehealth (INDEPENDENT_AMBULATORY_CARE_PROVIDER_SITE_OTHER): Payer: Medicare Other | Admitting: Family Medicine

## 2019-06-02 ENCOUNTER — Other Ambulatory Visit: Payer: Self-pay

## 2019-06-02 DIAGNOSIS — J309 Allergic rhinitis, unspecified: Secondary | ICD-10-CM | POA: Diagnosis not present

## 2019-06-02 MED ORDER — FLUTICASONE PROPIONATE 50 MCG/ACT NA SUSP: 2.0000 | Freq: Every day | NASAL | 6 refills | Status: DC

## 2019-06-02 MED ORDER — GUAIFENESIN-CODEINE 100-10 MG/5ML PO SYRP: 10.0000 mL | ORAL_SOLUTION | Freq: Three times a day (TID) | ORAL | 0 refills | Status: DC | PRN

## 2019-06-02 NOTE — Progress Notes (Signed)
Virtual Visit via Video   I connected with patient on 06/02/19 at  9:00 AM EDT by a video enabled telemedicine application and verified that I am speaking with the correct person using two identifiers.  Location patient: Home Location provider: Acupuncturist, Office Persons participating in the virtual visit: Patient, Provider, Golinda (Jess B)  I discussed the limitations of evaluation and management by telemedicine and the availability of in person appointments. The patient expressed understanding and agreed to proceed.  Subjective:   HPI:   URI- 'i'm all stuffed and have a head cold'.  sxs started Monday night w/ sneezing and watery eyes after sitting outside.  Subsequently developed coughing- 'it's really bad at night'.  Takes daily OTC allergy medication- 'generic Zyrtec'.  Does not use nasal spray.  + sick contacts at office.  Has used Nyquil, OTC cough medicine w/o relief.  No fever.  No facial pain/pressure, no tooth pain.  'my whole head feels like a balloon full of water'.  Denies chest congestion or SOB.  Cough is not productive.  ROS:   See pertinent positives and negatives per HPI.  Patient Active Problem List   Diagnosis Date Noted  . Overweight (BMI 25.0-29.9) 02/09/2018  . Numbness and tingling 08/05/2016  . Osteoporosis 04/18/2014  . Hyperlipidemia 04/18/2014  . CAD (coronary artery disease), native coronary artery 01/11/2014  . Chest pain 01/05/2014  . Pain in the chest   . Hand arthritis 03/23/2013  . Eczema 02/08/2013  . Routine general medical examination at a health care facility 02/06/2012  . Lumbar radicular pain 09/05/2011  . Fatigue 12/23/2010  . Neuropathy of foot 11/04/2010  . GERD 12/05/2009  . DYSPAREUNIA 12/05/2009  . SKIN CANCER, HX OF 12/05/2009  . COLONIC POLYPS, HX OF 12/05/2009    Social History   Tobacco Use  . Smoking status: Never Smoker  . Smokeless tobacco: Never Used  Substance Use Topics  . Alcohol use: Yes    Comment:   occasionally    Current Outpatient Medications:  .  aspirin 81 MG tablet, Take 81 mg by mouth daily., Disp: , Rfl:  .  atorvastatin (LIPITOR) 10 MG tablet, Take 1 tablet (10 mg total) by mouth daily., Disp: 90 tablet, Rfl: 3 .  Calcium Citrate-Vitamin D (CITRACAL/VITAMIN D) 250-200 MG-UNIT TABS, Take 2 tablets by mouth at bedtime. , Disp: , Rfl:  .  Coenzyme Q10 (CO Q-10 PO), Take 1 capsule by mouth every morning., Disp: , Rfl:  .  conjugated estrogens (PREMARIN) vaginal cream, Place 1.5 g vaginally once a week. Take one tablet by mouth on Sundays per patient, Disp: , Rfl:  .  ezetimibe (ZETIA) 10 MG tablet, Take 5 mg by mouth daily., Disp: , Rfl:  .  famotidine (PEPCID) 20 MG tablet, Take one tablet after dinner, Disp: 30 tablet, Rfl: 3 .  GLUCOSAMINE CHONDROITIN COMPLX PO, Take 2 tablets by mouth daily. , Disp: , Rfl:  .  meloxicam (MOBIC) 15 MG tablet, Take 1 tablet by mouth once daily, Disp: 30 tablet, Rfl: 0 .  Multiple Vitamins-Minerals (ALIVE WOMENS 50+ PO), Take 1 tablet by mouth every morning., Disp: , Rfl:  .  OVER THE COUNTER MEDICATION, Take 1 tablet by mouth daily. Tumeric and cumin , Disp: , Rfl:  .  pantoprazole (PROTONIX) 40 MG tablet, Take 1 tablet (40 mg total) by mouth 2 (two) times daily before a meal., Disp: 90 tablet, Rfl: 3 .  raloxifene (EVISTA) 60 MG tablet, Take 1 tablet by mouth  once daily, Disp: 90 tablet, Rfl: 0 .  timolol (BETIMOL) 0.5 % ophthalmic solution, Place 1 drop into both eyes 2 (two) times daily. , Disp: , Rfl:  .  tiZANidine (ZANAFLEX) 4 MG tablet, Take 1 tablet (4 mg total) by mouth every 8 (eight) hours as needed for muscle spasms., Disp: 30 tablet, Rfl: 0 .  TRAVATAN Z 0.004 % SOLN ophthalmic solution, Place 1 drop into both eyes at bedtime. , Disp: , Rfl: 1  Allergies  Allergen Reactions  . Amoxicillin Hives    Did it involve swelling of the face/tongue/throat, SOB, or low BP? No Did it involve sudden or severe rash/hives, skin peeling, or any  reaction on the inside of your mouth or nose? Yes Did you need to seek medical attention at a hospital or doctor's office? No When did it last happen?>37yrs ago  If all above answers are "NO", may proceed with cephalosporin use.    Objective:   There were no vitals taken for this visit. AAOx3, NAD NCAT, EOMI No obvious CN deficits Coloring WNL Pt is able to speak clearly, coherently without shortness of breath or increased work of breathing. + dry cough Thought process is linear.  Mood is appropriate.   Assessment and Plan:   Allergic sinusitis- pt's sxs and limited PE consistent w/ allergic inflammation and not infxn.  No benefit to starting abx.  Discussed prednisone taper but pt was emphatic that she not take steroids due to ongoing neck procedures (this is what she was told).  Will add daily nasal steroid and cough syrup.  Continue daily antihistamine.  Reviewed supportive care and red flags that should prompt return.  Pt expressed understanding and is in agreement w/ plan.    Annye Asa, MD 06/02/2019

## 2019-06-02 NOTE — Progress Notes (Signed)
I have discussed the procedure for the virtual visit with the patient who has given consent to proceed with assessment and treatment.   Pt unable to obtain vitals.   Avyn Coate L Korvin Valentine, CMA     

## 2019-06-11 ENCOUNTER — Other Ambulatory Visit: Payer: Self-pay | Admitting: Family Medicine

## 2019-06-30 DIAGNOSIS — M7918 Myalgia, other site: Secondary | ICD-10-CM | POA: Diagnosis not present

## 2019-06-30 DIAGNOSIS — M47812 Spondylosis without myelopathy or radiculopathy, cervical region: Secondary | ICD-10-CM | POA: Diagnosis not present

## 2019-06-30 DIAGNOSIS — G8929 Other chronic pain: Secondary | ICD-10-CM | POA: Diagnosis not present

## 2019-07-11 ENCOUNTER — Other Ambulatory Visit: Payer: Self-pay | Admitting: Family Medicine

## 2019-07-18 ENCOUNTER — Other Ambulatory Visit: Payer: Self-pay | Admitting: Internal Medicine

## 2019-07-26 DIAGNOSIS — M47812 Spondylosis without myelopathy or radiculopathy, cervical region: Secondary | ICD-10-CM | POA: Diagnosis not present

## 2019-07-29 DIAGNOSIS — H2513 Age-related nuclear cataract, bilateral: Secondary | ICD-10-CM | POA: Diagnosis not present

## 2019-07-29 DIAGNOSIS — H401123 Primary open-angle glaucoma, left eye, severe stage: Secondary | ICD-10-CM | POA: Diagnosis not present

## 2019-07-29 DIAGNOSIS — H02052 Trichiasis without entropian right lower eyelid: Secondary | ICD-10-CM | POA: Diagnosis not present

## 2019-08-12 ENCOUNTER — Other Ambulatory Visit: Payer: Self-pay | Admitting: Family Medicine

## 2019-08-16 ENCOUNTER — Other Ambulatory Visit: Payer: Self-pay

## 2019-08-16 ENCOUNTER — Encounter: Payer: Self-pay | Admitting: Family Medicine

## 2019-08-16 ENCOUNTER — Ambulatory Visit (INDEPENDENT_AMBULATORY_CARE_PROVIDER_SITE_OTHER): Payer: Medicare Other | Admitting: Family Medicine

## 2019-08-16 VITALS — BP 124/72 | HR 78 | Temp 98.1°F | Resp 16 | Ht 62.0 in | Wt 152.4 lb

## 2019-08-16 DIAGNOSIS — E785 Hyperlipidemia, unspecified: Secondary | ICD-10-CM

## 2019-08-16 DIAGNOSIS — M818 Other osteoporosis without current pathological fracture: Secondary | ICD-10-CM

## 2019-08-16 DIAGNOSIS — Z1211 Encounter for screening for malignant neoplasm of colon: Secondary | ICD-10-CM | POA: Diagnosis not present

## 2019-08-16 DIAGNOSIS — Z0000 Encounter for general adult medical examination without abnormal findings: Secondary | ICD-10-CM

## 2019-08-16 DIAGNOSIS — M47812 Spondylosis without myelopathy or radiculopathy, cervical region: Secondary | ICD-10-CM | POA: Diagnosis not present

## 2019-08-16 LAB — CBC WITH DIFFERENTIAL/PLATELET
Basophils Absolute: 0.1 10*3/uL (ref 0.0–0.1)
Basophils Relative: 0.8 % (ref 0.0–3.0)
Eosinophils Absolute: 0.4 10*3/uL (ref 0.0–0.7)
Eosinophils Relative: 6.5 % — ABNORMAL HIGH (ref 0.0–5.0)
HCT: 37.4 % (ref 36.0–46.0)
Hemoglobin: 12.7 g/dL (ref 12.0–15.0)
Lymphocytes Relative: 31 % (ref 12.0–46.0)
Lymphs Abs: 1.9 10*3/uL (ref 0.7–4.0)
MCHC: 33.9 g/dL (ref 30.0–36.0)
MCV: 93.1 fl (ref 78.0–100.0)
Monocytes Absolute: 0.5 10*3/uL (ref 0.1–1.0)
Monocytes Relative: 7.5 % (ref 3.0–12.0)
Neutro Abs: 3.3 10*3/uL (ref 1.4–7.7)
Neutrophils Relative %: 54.2 % (ref 43.0–77.0)
Platelets: 268 10*3/uL (ref 150.0–400.0)
RBC: 4.02 Mil/uL (ref 3.87–5.11)
RDW: 12.4 % (ref 11.5–15.5)
WBC: 6.1 10*3/uL (ref 4.0–10.5)

## 2019-08-16 LAB — BASIC METABOLIC PANEL
BUN: 22 mg/dL (ref 6–23)
CO2: 30 mEq/L (ref 19–32)
Calcium: 9.6 mg/dL (ref 8.4–10.5)
Chloride: 103 mEq/L (ref 96–112)
Creatinine, Ser: 0.81 mg/dL (ref 0.40–1.20)
GFR: 68.99 mL/min (ref >60.00)
Glucose, Bld: 102 mg/dL — ABNORMAL HIGH (ref 70–99)
Potassium: 4.4 mEq/L (ref 3.5–5.1)
Sodium: 138 mEq/L (ref 135–145)

## 2019-08-16 LAB — TSH: TSH: 2.89 u[IU]/mL (ref 0.35–4.50)

## 2019-08-16 LAB — HEPATIC FUNCTION PANEL
ALT: 15 U/L (ref 0–35)
AST: 22 U/L (ref 0–37)
Albumin: 4.4 g/dL (ref 3.5–5.2)
Alkaline Phosphatase: 58 U/L (ref 39–117)
Bilirubin, Direct: 0.1 mg/dL (ref 0.0–0.3)
Total Bilirubin: 0.6 mg/dL (ref 0.2–1.2)
Total Protein: 6.5 g/dL (ref 6.0–8.3)

## 2019-08-16 LAB — LIPID PANEL
Cholesterol: 119 mg/dL (ref 0–200)
HDL: 48.4 mg/dL (ref >39.00)
LDL Cholesterol: 55 mg/dL (ref 0–99)
NonHDL: 70.19
Total CHOL/HDL Ratio: 2
Triglycerides: 77 mg/dL (ref 0.0–149.0)
VLDL: 15.4 mg/dL (ref 0.0–40.0)

## 2019-08-16 LAB — VITAMIN D 25 HYDROXY (VIT D DEFICIENCY, FRACTURES): VITD: 67.97 ng/mL (ref 30.00–100.00)

## 2019-08-16 NOTE — Assessment & Plan Note (Signed)
Chronic problem.  UTD on DEXA.  Check Vit D level and replete prn.

## 2019-08-16 NOTE — Assessment & Plan Note (Signed)
Chronic problem.  Tolerating statin w/o difficulty.  Check labs.  Adjust meds prn  

## 2019-08-16 NOTE — Progress Notes (Signed)
   Subjective:    Patient ID: Denise Ewing, female    DOB: 03-09-1945, 74 y.o.   MRN: [id number]  HPI CPE- UTD on mammo, DEXA, Pneumonia vaccines, COVID vaccines.  Due for colon cancer screen.   Review of Systems Patient reports no vision/ hearing changes, adenopathy,fever, weight change,  persistant/recurrent hoarseness , swallowing issues, chest pain, palpitations, edema, persistant/recurrent cough, hemoptysis, dyspnea (rest/exertional/paroxysmal nocturnal), gastrointestinal bleeding (melena, rectal bleeding), abdominal pain, significant heartburn, bowel changes, GU symptoms (dysuria, hematuria, incontinence), Gyn symptoms (abnormal  bleeding, pain),  syncope, focal weakness, memory loss, numbness & tingling, skin/hair/nail changes, abnormal bruising or bleeding, anxiety, or depression.   This visit occurred during the SARS-CoV-2 public health emergency.  Safety protocols were in place, including screening questions prior to the visit, additional usage of staff PPE, and extensive cleaning of exam room while observing appropriate contact time as indicated for disinfecting solutions.       Objective:   Physical Exam General Appearance:    Alert, cooperative, no distress, appears stated age  Head:    Normocephalic, without obvious abnormality, atraumatic  Eyes:    PERRL, conjunctiva/corneas clear, EOM's intact, fundi    benign, both eyes  Ears:    Normal TM's and external ear canals, both ears  Nose:   Deferred due to COVID  Throat:   Neck:   Supple, symmetrical, trachea midline, no adenopathy;    Thyroid: no enlargement/tenderness/nodules  Back:     Symmetric, no curvature, ROM normal, no CVA tenderness  Lungs:     Clear to auscultation bilaterally, respirations unlabored  Chest Wall:    No tenderness or deformity   Heart:    Regular rate and rhythm, S1 and S2 normal, no murmur, rub   or gallop  Breast Exam:    Deferred to mammo  Abdomen:     Soft, non-tender, bowel sounds active all  four quadrants,    no masses, no organomegaly  Genitalia:    Deferred  Rectal:    Extremities:   Extremities normal, atraumatic, no cyanosis or edema  Pulses:   2+ and symmetric all extremities  Skin:   Skin color, texture, turgor normal, no rashes or lesions  Lymph nodes:   Cervical, supraclavicular, and axillary nodes normal  Neurologic:   CNII-XII intact, normal strength, sensation and reflexes    throughout          Assessment & Plan:

## 2019-08-16 NOTE — Patient Instructions (Signed)
Follow up in 6 months to recheck cholesterol We'll notify you of your lab results and make any changes if needed Keep up the good work on healthy diet and regular exercise- you look great! We'll call you with your GI appt for the colon cancer screening Call with any questions or concerns Have a great summer! GOOD LUCK WITH THE HOUSE!!!

## 2019-08-16 NOTE — Assessment & Plan Note (Signed)
Pt's PE WNL.  UTD on mammo, DEXA, pneumonia, and COVID vaccines.  Referred to GI for colon cancer screen.  Check labs.  Anticipatory guidance provided.

## 2019-08-17 ENCOUNTER — Other Ambulatory Visit: Payer: Self-pay | Admitting: Family Medicine

## 2019-08-17 ENCOUNTER — Encounter: Payer: Self-pay | Admitting: General Practice

## 2019-09-06 DIAGNOSIS — M47812 Spondylosis without myelopathy or radiculopathy, cervical region: Secondary | ICD-10-CM | POA: Diagnosis not present

## 2019-09-09 ENCOUNTER — Telehealth: Payer: Self-pay | Admitting: Family Medicine

## 2019-09-09 MED ORDER — MELOXICAM 15 MG PO TABS: 15.0000 mg | ORAL_TABLET | Freq: Every day | ORAL | 0 refills | Status: DC

## 2019-09-09 NOTE — Telephone Encounter (Signed)
Medication filled to pharmacy as requested.   

## 2019-09-09 NOTE — Telephone Encounter (Signed)
Patient would like her Meloxicam  refilled and sent to [facility] Phone # [phone] - Last visit 08/16/2019 next scheduled appt 02-09-2020

## 2019-09-12 ENCOUNTER — Ambulatory Visit: Payer: Medicare Other | Admitting: Physician Assistant

## 2019-09-12 ENCOUNTER — Encounter: Payer: Self-pay | Admitting: Physician Assistant

## 2019-09-12 ENCOUNTER — Other Ambulatory Visit: Payer: Self-pay

## 2019-09-12 VITALS — BP 120/60 | HR 76 | Temp 98.0°F | Resp 14 | Ht 61.5 in | Wt 155.8 lb

## 2019-09-12 DIAGNOSIS — R300 Dysuria: Secondary | ICD-10-CM

## 2019-09-12 LAB — POCT URINALYSIS DIPSTICK
Bilirubin, UA: NEGATIVE
Glucose, UA: NEGATIVE
Ketones, UA: NEGATIVE
Nitrite, UA: NEGATIVE
Protein, UA: NEGATIVE
Spec Grav, UA: 1.015 (ref 1.010–1.025)
Urobilinogen, UA: 0.2 E.U./dL
pH, UA: 5.5 (ref 5.0–8.0)

## 2019-09-12 MED ORDER — SULFAMETHOXAZOLE-TRIMETHOPRIM 800-160 MG PO TABS
1.0000 | ORAL_TABLET | Freq: Two times a day (BID) | ORAL | 0 refills | Status: DC
Start: 2019-09-12 — End: 2020-01-27

## 2019-09-12 NOTE — Progress Notes (Signed)
Patient presents to clinic today c/o 1.5 days of urinary urgency, frequency, hesitancy and dysuria starting after intercourse with her husband. Patient with history of vaginal atrophy, currently using topical estrogen creams. Denies any vaginal pain or discharge. Notes some lower abdominal discomfort. Denies flank pain, nausea/vomiting, fever or chills.   Past Medical History:  Diagnosis Date  . Allergy    post nasal drip  . Arthritis   . Blood transfusion without reported diagnosis    with hysterectomy 11 years ago  . GERD (gastroesophageal reflux disease)   . Glaucoma    bilateral eyes  . History of colonic polyps   . Melanoma of thigh ([place])   . Osteoporosis     Current Outpatient Medications on File Prior to Visit  Medication Sig Dispense Refill  . aspirin 81 MG tablet Take 81 mg by mouth daily.    [name] atorvastatin (LIPITOR) 10 MG tablet Take 1 tablet (10 mg total) by mouth daily. 90 tablet 3  . Calcium Citrate-Vitamin D (CITRACAL/VITAMIN D) 250-200 MG-UNIT TABS Take 2 tablets by mouth at bedtime.     . Coenzyme Q10 (CO Q-10 PO) Take 1 capsule by mouth every morning.    . conjugated estrogens (PREMARIN) vaginal cream Place 1.5 g vaginally once a week. Take one tablet by mouth on Sundays per patient    . ezetimibe (ZETIA) 10 MG tablet Take 0.5 tablets (5 mg total) by mouth daily. 45 tablet 1  . famotidine (PEPCID) 20 MG tablet Take one tablet after dinner 30 tablet 3  . GLUCOSAMINE CHONDROITIN COMPLX PO Take 2 tablets by mouth daily.     . meloxicam (MOBIC) 15 MG tablet Take 1 tablet (15 mg total) by mouth daily. 30 tablet 0  . Multiple Vitamins-Minerals (ALIVE WOMENS 50+ PO) Take 1 tablet by mouth every morning.    [name] OVER THE COUNTER MEDICATION Take 1 tablet by mouth daily. Tumeric and cumin     . pantoprazole (PROTONIX) 40 MG tablet Take 1 tablet (40 mg total) by mouth 2 (two) times daily before a meal. 90 tablet 3  . raloxifene (EVISTA) 60 MG tablet Take 1 tablet by mouth once  daily 90 tablet 0  . timolol (BETIMOL) 0.5 % ophthalmic solution Place 1 drop into both eyes 2 (two) times daily.     . timolol (TIMOPTIC) 0.5 % ophthalmic solution 1 drop 2 (two) times daily.    . TRAVATAN Z 0.004 % SOLN ophthalmic solution Place 1 drop into both eyes at bedtime.   1   No current facility-administered medications on file prior to visit.    Allergies  Allergen Reactions  . Amoxicillin Hives    Did it involve swelling of the face/tongue/throat, SOB, or low BP? No Did it involve sudden or severe rash/hives, skin peeling, or any reaction on the inside of your mouth or nose? Yes Did you need to seek medical attention at a hospital or doctor's office? No When did it last happen?>15yrs ago  If all above answers are "NO", may proceed with cephalosporin use.    Family History  Problem Relation Age of Onset  . Hypertension Mother   . Cancer Mother        breast,anal  . Osteoporosis Mother   . Breast cancer Mother 52  . Rectal cancer Mother   . CAD Sister   . Hypothyroidism Sister   . Colon polyps Neg Hx   . Esophageal cancer Neg Hx   . Stomach cancer Neg Hx  Social History   Socioeconomic History  . Marital status: Married    Spouse name: Not on file  . Number of children: Not on file  . Years of education: Not on file  . Highest education level: Not on file  Occupational History  . Not on file  Tobacco Use  . Smoking status: Never Smoker  . Smokeless tobacco: Never Used  Vaping Use  . Vaping Use: Never used  Substance and Sexual Activity  . Alcohol use: Yes    Comment:  occasionally  . Drug use: No  . Sexual activity: Not on file  Other Topics Concern  . Not on file  Social History Narrative  . Not on file   Social Determinants of Health   Financial Resource Strain:   . Difficulty of Paying Living Expenses:   Food Insecurity:   . Worried About Charity fundraiser in the Last Year:   . Arboriculturist in the Last Year:   Transportation  Needs:   . Film/video editor (Medical):   [name] Lack of Transportation (Non-Medical):   Physical Activity:   . Days of Exercise per Week:   . Minutes of Exercise per Session:   Stress:   . Feeling of Stress :   Social Connections:   . Frequency of Communication with Friends and Family:   . Frequency of Social Gatherings with Friends and Family:   . Attends Religious Services:   . Active Member of Clubs or Organizations:   . Attends Archivist Meetings:   [name] Marital Status:    Review of Systems - See HPI.  All other ROS are negative.  BP 120/60   Pulse 76   Temp 98 F (36.7 C) (Skin)   Resp 14   Ht 5' 1.5" (1.562 m)   Wt 155 lb 12.8 oz (70.7 kg)   SpO2 95%   BMI 28.96 kg/m   Physical Exam Vitals reviewed.  Constitutional:      Appearance: Normal appearance.  Cardiovascular:     Rate and Rhythm: Normal rate and regular rhythm.     Pulses: Normal pulses.     Heart sounds: Normal heart sounds.  Pulmonary:     Effort: Pulmonary effort is normal.     Breath sounds: Normal breath sounds.  Abdominal:     General: Bowel sounds are normal.     Palpations: Abdomen is soft.     Tenderness: There is no abdominal tenderness. There is no right CVA tenderness or left CVA tenderness.  Musculoskeletal:     Cervical back: Neck supple.  Neurological:     General: No focal deficit present.     Mental Status: She is alert and oriented to person, place, and time.  Psychiatric:        Mood and Affect: Mood normal.     Recent Results (from the past 2160 hour(s))  Lipid panel     Status: None   Collection Time: 08/16/19  9:35 AM  Result Value Ref Range   Cholesterol 119 0 - 200 mg/dL    Comment: ATP III Classification       Desirable:  < 200 mg/dL               Borderline High:  200 - 239 mg/dL          High:  > = 240 mg/dL   Triglycerides 77.0 0 - 149 mg/dL    Comment: Normal:  <150 mg/dLBorderline High:  150 -  199 mg/dL   HDL 48.40 >39.00 mg/dL   VLDL 15.4 0.0 - 40.0  mg/dL   LDL Cholesterol 55 0 - 99 mg/dL   Total CHOL/HDL Ratio 2     Comment:                Men          Women1/2 Average Risk     3.4          3.3Average Risk          5.0          4.42X Average Risk          9.6          7.13X Average Risk          15.0          11.0                       NonHDL 70.19     Comment: NOTE:  Non-HDL goal should be 30 mg/dL higher than patient's LDL goal (i.e. LDL goal of < 70 mg/dL, would have non-HDL goal of < 100 mg/dL)  Basic metabolic panel     Status: Abnormal   Collection Time: 08/16/19  9:35 AM  Result Value Ref Range   Sodium 138 135 - 145 mEq/L   Potassium 4.4 3.5 - 5.1 mEq/L   Chloride 103 96 - 112 mEq/L   CO2 30 19 - 32 mEq/L   Glucose, Bld 102 (H) 70 - 99 mg/dL   BUN 22 6 - 23 mg/dL   Creatinine, Ser 0.81 0.40 - 1.20 mg/dL   GFR 68.99 >60.00 mL/min   Calcium 9.6 8.4 - 10.5 mg/dL  TSH     Status: None   Collection Time: 08/16/19  9:35 AM  Result Value Ref Range   TSH 2.89 0.35 - 4.50 uIU/mL  Hepatic function panel     Status: None   Collection Time: 08/16/19  9:35 AM  Result Value Ref Range   Total Bilirubin 0.6 0.2 - 1.2 mg/dL   Bilirubin, Direct 0.1 0.0 - 0.3 mg/dL   Alkaline Phosphatase 58 39 - 117 U/L   AST 22 0 - 37 U/L   ALT 15 0 - 35 U/L   Total Protein 6.5 6.0 - 8.3 g/dL   Albumin 4.4 3.5 - 5.2 g/dL  CBC with Differential/Platelet     Status: Abnormal   Collection Time: 08/16/19  9:35 AM  Result Value Ref Range   WBC 6.1 4.0 - 10.5 K/uL   RBC 4.02 3.87 - 5.11 Mil/uL   Hemoglobin 12.7 12.0 - 15.0 g/dL   HCT 37.4 36 - 46 %   MCV 93.1 78.0 - 100.0 fl   MCHC 33.9 30.0 - 36.0 g/dL   RDW 12.4 11.5 - 15.5 %   Platelets 268.0 150 - 400 K/uL   Neutrophils Relative % 54.2 43 - 77 %   Lymphocytes Relative 31.0 12 - 46 %   Monocytes Relative 7.5 3 - 12 %   Eosinophils Relative 6.5 (H) 0 - 5 %   Basophils Relative 0.8 0 - 3 %   Neutro Abs 3.3 1.4 - 7.7 K/uL   Lymphs Abs 1.9 0.7 - 4.0 K/uL   Monocytes Absolute 0.5 0 - 1 K/uL    Eosinophils Absolute 0.4 0 - 0 K/uL   Basophils Absolute 0.1 0 - 0 K/uL  VITAMIN D 25 Hydroxy (Vit-D Deficiency, Fractures)  Status: None   Collection Time: 08/16/19  9:35 AM  Result Value Ref Range   VITD 67.97 30.00 - 100.00 ng/mL  POCT urinalysis dipstick     Status: Abnormal   Collection Time: 09/12/19  1:27 PM  Result Value Ref Range   Color, UA yellow    Clarity, UA cloudy    Glucose, UA Negative Negative   Bilirubin, UA negative    Ketones, UA negative    Spec Grav, UA 1.015 1.010 - 1.025   Blood, UA trace    pH, UA 5.5 5.0 - 8.0   Protein, UA Negative Negative   Urobilinogen, UA 0.2 0.2 or 1.0 E.U./dL   Nitrite, UA negative    Leukocytes, UA Large (3+) (A) Negative   Appearance     Odor     Assessment/Plan: 1. Dysuria Classic UTI symptoms. No alarm signs/symptoms present. UA with + blood and LE. Cloudy with odor. Urine culture sent. Patient is penicillin allergic. Rx Bactrim BID x 5 days. Will alter regimen based on culture sensitivities. Supportive measures and OTC medications reviewed. Strict return precautions discussed.  - POCT urinalysis dipstick - Urine Culture  This visit occurred during the SARS-CoV-2 public health emergency.  Safety protocols were in place, including screening questions prior to the visit, additional usage of staff PPE, and extensive cleaning of exam room while observing appropriate contact time as indicated for disinfecting solutions.     Leeanne Rio, PA-C

## 2019-09-12 NOTE — Patient Instructions (Signed)
Your symptoms are consistent with a bladder infection, also called acute cystitis. Please take your antibiotic (Bactrim) as directed until all pills are gone.  Stay very well hydrated.  Consider a daily probiotic (Align, Culturelle, or Activia) to help prevent stomach upset caused by the antibiotic.  Taking a probiotic daily may also help prevent recurrent UTIs.  Also consider taking AZO (Phenazopyridine) tablets to help decrease pain with urination.  I will call you with your urine testing results.  We will change antibiotics if indicated.  Call or return to clinic if symptoms are not resolved by completion of antibiotic.   Urinary Tract Infection A urinary tract infection (UTI) can occur any place along the urinary tract. The tract includes the kidneys, ureters, bladder, and urethra. A type of germ called bacteria often causes a UTI. UTIs are often helped with antibiotic medicine.  HOME CARE   If given, take antibiotics as told by your doctor. Finish them even if you start to feel better.  Drink enough fluids to keep your pee (urine) clear or pale yellow.  Avoid tea, drinks with caffeine, and bubbly (carbonated) drinks.  Pee often. Avoid holding your pee in for a long time.  Pee before and after having sex (intercourse).  Wipe from front to back after you poop (bowel movement) if you are a woman. Use each tissue only once. GET HELP RIGHT AWAY IF:   You have back pain.  You have lower belly (abdominal) pain.  You have chills.  You feel sick to your stomach (nauseous).  You throw up (vomit).  Your burning or discomfort with peeing does not go away.  You have a fever.  Your symptoms are not better in 3 days. MAKE SURE YOU:   Understand these instructions.  Will watch your condition.  Will get help right away if you are not doing well or get worse. Document Released: 07/30/2007 Document Revised: 11/05/2011 Document Reviewed: 09/11/2011 [facility] Patient Information 2015  [place], [place]. This information is not intended to replace advice given to you by your health care provider. Make sure you discuss any questions you have with your health care provider.

## 2019-09-13 LAB — URINE CULTURE
MICRO NUMBER:: 10721369
SPECIMEN QUALITY:: ADEQUATE

## 2019-09-15 ENCOUNTER — Telehealth: Payer: Self-pay | Admitting: Gastroenterology

## 2019-09-20 ENCOUNTER — Encounter: Payer: Self-pay | Admitting: Gastroenterology

## 2019-09-22 ENCOUNTER — Telehealth: Payer: Self-pay | Admitting: Family Medicine

## 2019-09-22 MED ORDER — ATORVASTATIN CALCIUM 10 MG PO TABS: 10.0000 mg | ORAL_TABLET | Freq: Every day | ORAL | 1 refills | Status: DC

## 2019-09-22 NOTE — Telephone Encounter (Signed)
Medication filled to pharmacy as requested.   

## 2019-09-22 NOTE — Telephone Encounter (Signed)
Pt's husband called in asking for a new script of the Atorvastatin to be sent into [facility].   Please advise

## 2019-09-27 ENCOUNTER — Telehealth: Payer: Self-pay | Admitting: *Deleted

## 2019-09-27 NOTE — Telephone Encounter (Signed)
Thank you-  [facility] Lelan Pons pV  Recall placed for [phone] # [id number]

## 2019-09-27 NOTE — Telephone Encounter (Signed)
My verbiage was a bit confusing on 01/2019 office note, I think due to dragon transcription related errors.  Her cologuard in 2020 was negative. She should colon cancer screening (I recommend with a colonoscopy and NOT cologuard) 01/2022, can you put her in recall system for that?  thanks

## 2019-09-27 NOTE — Telephone Encounter (Signed)
DR Ardis Hughs,  This pt is scheduled to have a colon with you- you saw her in the office 01-2019 and note ended with this   Document her family history of colon cancer.  Her insurance company sent her Cologuard test which she completed and mailed in.  She has not heard the results of that.  I explained to her that we should have sent that to her given her family history of colon cancer.  If it is negative however I think it is probably safe for her to wait 3 years before considering repeat colon cancer screening.  Her Colo guard 02-07-2019 is negative-  She has a hx of rectal cancer in her mom Is she due now For a colon?  Please advise, thanks so much-- Lelan Pons PV

## 2019-09-28 NOTE — Telephone Encounter (Signed)
Spoke with pt this morning- explained she needs Colon recall 01/2022 per Dr Ardis Hughs-  I canceled PV 8-12, 8-9 and also colon 9-22- Pt explained she has a recall in place for 01/2022

## 2019-09-29 DIAGNOSIS — Z0120 Encounter for dental examination and cleaning without abnormal findings: Secondary | ICD-10-CM | POA: Diagnosis not present

## 2019-10-04 ENCOUNTER — Other Ambulatory Visit: Payer: Self-pay | Admitting: Gastroenterology

## 2019-10-06 DIAGNOSIS — M47812 Spondylosis without myelopathy or radiculopathy, cervical region: Secondary | ICD-10-CM | POA: Diagnosis not present

## 2019-10-12 ENCOUNTER — Other Ambulatory Visit: Payer: Self-pay | Admitting: Family Medicine

## 2019-10-18 ENCOUNTER — Other Ambulatory Visit: Payer: Self-pay | Admitting: Internal Medicine

## 2019-11-02 DIAGNOSIS — H04123 Dry eye syndrome of bilateral lacrimal glands: Secondary | ICD-10-CM | POA: Diagnosis not present

## 2019-11-02 DIAGNOSIS — H401123 Primary open-angle glaucoma, left eye, severe stage: Secondary | ICD-10-CM | POA: Diagnosis not present

## 2019-11-02 DIAGNOSIS — H401111 Primary open-angle glaucoma, right eye, mild stage: Secondary | ICD-10-CM | POA: Diagnosis not present

## 2019-11-04 ENCOUNTER — Other Ambulatory Visit: Payer: Self-pay

## 2019-11-04 ENCOUNTER — Ambulatory Visit (INDEPENDENT_AMBULATORY_CARE_PROVIDER_SITE_OTHER): Payer: Medicare Other

## 2019-11-04 DIAGNOSIS — Z23 Encounter for immunization: Secondary | ICD-10-CM | POA: Diagnosis not present

## 2019-11-09 ENCOUNTER — Other Ambulatory Visit: Payer: Self-pay | Admitting: Family Medicine

## 2019-11-14 ENCOUNTER — Other Ambulatory Visit: Payer: Self-pay | Admitting: General Practice

## 2019-11-14 MED ORDER — RALOXIFENE HCL 60 MG PO TABS: 60.0000 mg | ORAL_TABLET | Freq: Every day | ORAL | 0 refills | Status: DC

## 2019-11-16 ENCOUNTER — Encounter: Payer: Medicare Other | Admitting: Gastroenterology

## 2019-11-25 ENCOUNTER — Ambulatory Visit: Payer: Medicare Other

## 2019-11-25 DIAGNOSIS — E785 Hyperlipidemia, unspecified: Secondary | ICD-10-CM

## 2019-11-25 NOTE — Progress Notes (Signed)
Chronic Care Management Pharmacy  Name: Denise Ewing  MRN: [id number] DOB: 22-Feb-1946  Chief Complaint/ HPI  Denise Ewing,  74 y.o., female presents for their Follow-Up CCM visit with the clinical pharmacist via telephone due to COVID-19 Pandemic.  PCP : [name], MD  Chronic conditions include:  Encounter Diagnosis  Name Primary?  . Hyperlipidemia, unspecified hyperlipidemia type Yes    Office Visits:  09/12/2019 Elyn Aquas): uti. Bactrim +/- azo for pain 08/16/2019 (PCP): referred to GI for colon cancer screen   Patient Active Problem List   Diagnosis Date Noted  . Overweight (BMI 25.0-29.9) 02/09/2018  . Numbness and tingling 08/05/2016  . Osteoporosis 04/18/2014  . Hyperlipidemia 04/18/2014  . CAD (coronary artery disease), native coronary artery 01/11/2014  . Chest pain 01/05/2014  . Pain in the chest   . Hand arthritis 03/23/2013  . Eczema 02/08/2013  . Routine general medical examination at a health care facility 02/06/2012  . Lumbar radicular pain 09/05/2011  . Fatigue 12/23/2010  . Neuropathy of foot 11/04/2010  . GERD 12/05/2009  . DYSPAREUNIA 12/05/2009  . SKIN CANCER, HX OF 12/05/2009  . COLONIC POLYPS, HX OF 12/05/2009   Past Surgical History:  Procedure Laterality Date  . Colon polyps removed    . Melanoma removed     R thigh  . TOTAL ABDOMINAL HYSTERECTOMY W/ BILATERAL SALPINGOOPHORECTOMY     uterine fibroids   Family History  Problem Relation Age of Onset  . Hypertension Mother   . Cancer Mother        breast,anal  . Osteoporosis Mother   . Breast cancer Mother 17  . Rectal cancer Mother   . CAD Sister   . Hypothyroidism Sister   . Colon polyps Neg Hx   . Esophageal cancer Neg Hx   . Stomach cancer Neg Hx    Allergies  Allergen Reactions  . Amoxicillin Hives    Did it involve swelling of the face/tongue/throat, SOB, or low BP? No Did it involve sudden or severe rash/hives, skin peeling, or any reaction on the inside of your  mouth or nose? Yes Did you need to seek medical attention at a hospital or doctor's office? No When did it last happen?>71yr ago  If all above answers are "NO", may proceed with cephalosporin use.   Outpatient Encounter Medications as of 11/25/2019  Medication Sig  . aspirin 81 MG tablet Take 81 mg by mouth daily.  .Marland Kitchenatorvastatin (LIPITOR) 10 MG tablet Take 1 tablet (10 mg total) by mouth daily.  . Calcium Citrate-Vitamin D (CITRACAL/VITAMIN D) 250-200 MG-UNIT TABS Take 2 tablets by mouth at bedtime.   . Coenzyme Q10 (CO Q-10 PO) Take 1 capsule by mouth every morning.  . conjugated estrogens (PREMARIN) vaginal cream Place 1.5 g vaginally once a week. Take one tablet by mouth on Sundays per patient  . ezetimibe (ZETIA) 10 MG tablet TAKE 1/2 TABLET BY MOUTH EVERY DAY  . famotidine (PEPCID) 20 MG tablet Take one tablet after dinner  . GLUCOSAMINE CHONDROITIN COMPLX PO Take 2 tablets by mouth daily.   . meloxicam (MOBIC) 15 MG tablet TAKE 1 TABLET BY MOUTH EVERY DAY  . Multiple Vitamins-Minerals (ALIVE WOMENS 50+ PO) Take 1 tablet by mouth every morning.  .Marland KitchenOVER THE COUNTER MEDICATION Take 1 tablet by mouth daily. Tumeric and cumin   . pantoprazole (PROTONIX) 40 MG tablet TAKE 1 TABLET BY MOUTH 2 TIMES DAILY BEFORE A MEAL  . raloxifene (EVISTA) 60 MG tablet Take 1  tablet (60 mg total) by mouth daily.  [name] sulfamethoxazole-trimethoprim (BACTRIM DS) 800-160 MG tablet Take 1 tablet by mouth 2 (two) times daily.  . timolol (BETIMOL) 0.5 % ophthalmic solution Place 1 drop into both eyes 2 (two) times daily.   . timolol (TIMOPTIC) 0.5 % ophthalmic solution 1 drop 2 (two) times daily.  . TRAVATAN Z 0.004 % SOLN ophthalmic solution Place 1 drop into both eyes at bedtime.    No facility-administered encounter medications on file as of 11/25/2019.   Patient Care Team    Relationship Specialty Notifications Start End  [name], MD PCP - General   02/06/10   Fay Records, MD PCP -  Cardiology Cardiology Admissions 03/21/19   Linda Hedges, DO Consulting Physician Obstetrics and Gynecology  04/18/14   Marygrace Drought, MD Consulting Physician Ophthalmology  08/02/15   Milus Banister, MD Attending Physician Gastroenterology  02/09/19   Madelin Rear, [facility]  Pharmacist  05/24/19    Comment: phone number [phone]   Current Diagnosis/Assessment: Goals Addressed            This Visit's Progress   . PharmD Care Plan   On track    CARE PLAN ENTRY  Current Barriers:  . Chronic Disease Management support, education, and care coordination needs related to CAD and HLD  Pharmacist Clinical Goal(s):  [name] LDL <70 . Receive Tdap vaccine within 90 days  Interventions: . Comprehensive medication review performed.  Patient Self Care Activities:  . Patient verbalizes understanding of plan to get Tdap at local pharmacy  Initial goal documentation       Hyperlipidemia   LDL goal < 70  Lipid Panel     Component Value Date/Time   CHOL 119 08/16/2019 0935   CHOL 110 04/10/2016 1143   CHOL 96 (L) 12/21/2014 0824   TRIG 77.0 08/16/2019 0935   TRIG 66 12/21/2014 0824   HDL 48.40 08/16/2019 0935   HDL 54 04/10/2016 1143   HDL 45 (L) 12/21/2014 0824   LDLCALC 55 08/16/2019 0935   LDLCALC 45 04/10/2016 1143   LDLCALC 38 12/21/2014 0824    Hepatic Function Latest Ref Rng & Units 08/16/2019 02/23/2019 08/11/2018  Total Protein 6.0 - 8.3 g/dL 6.5 6.2 6.2  Albumin 3.5 - 5.2 g/dL 4.4 4.1 4.2  AST 0 - 37 U/L _0 ALT 0 - 35 U/L _1 Alk Phosphatase 39 - 117 U/L 58 48 54  Total Bilirubin 0.2 - 1.2 mg/dL 0.6 0.5 0.5  Bilirubin, Direct 0.0 - 0.3 mg/dL 0.1 0.1 0.1    The ASCVD Risk score ([place]., et al., 2013) failed to calculate for the following reasons:   The valid total cholesterol range is 130 to 320 mg/dL   Patient has failed these meds in past: n/a Patient is currently at goal  on the following medications:  . Atorvastatin 10 mg once daily . Ezetimibe  10 mg once daily  We discussed:  Diet - low carb, no sugar - vegtables, lean meats, chicken, [place].  Plan  Continue current medications and control with diet and exercise.  GERD   Patient reports some recent acid reflux. Rx for BID pantoprazole, taking once daily. Currently taking: . Pantoprazole 40 mg once daily  We discussed: Avoidance of potential triggers such as alcohol, fatty foods and tomato sauce.  Plan   Take pantoprazole 40 mg twice daily as prescribed.  Osteopenia    Last DEXA Scan: 02/2019. Osteopenia  VITD  Date Value Ref Range Status  08/16/2019 67.97 30.00 - 100.00 ng/mL Final     Patient is currently controlled on the following medications:  . Calcium / vitamin d3 supplementation . Evista 60 mg once daily   We discussed:  Recommend [phone] units of vitamin D daily. Counseled on oral bisphosphonate administration: take in the morning, 30 minutes prior to food with 6-8 oz of water. Do not lie down for at least 30 minutes after taking. Recommend weight-bearing and muscle strengthening exercises for building and maintaining bone density.  Plan  Continue current medications.  Vaccines   Immunization History  Administered Date(s) Administered  . Fluad Quad(high Dose 65+) 11/04/2019  . Influenza Split 12/16/2011  . Influenza Whole 12/05/2009, 11/04/2010  . Influenza, High Dose Seasonal PF 11/30/2012, 10/30/2018  . Influenza,inj,Quad PF,6+ Mos 11/15/2013, 11/28/2014, 10/15/2015, 12/16/2016, 12/03/2017  . Influenza-Unspecified 10/25/2012  . PFIZER SARS-COV-2 Vaccination 04/01/2019, 04/26/2019  . Pneumococcal Conjugate-13 01/11/2014  . Pneumococcal Polysaccharide-23 09/05/2011  . Tdap 02/09/2007  . Zoster Recombinat (Shingrix) 08/22/2017, 10/30/2017   Reviewed and discussed patient's vaccination history.    Third booster shot received Wednesday, annual flu already received.   Plan  Recommend patient receive Tdap vaccine.   Medication Management /  Coordination   Receives prescription medications from:  [place], [place] - [address] [place] [address] Phone: [phone] Fax: [phone]   Wanting to consider using less meloxicam on daily basis. Requesting rx for meloxicam 7.5 mg tablet - 1-2 tablets daily as needed.   Plan  Continue current medication management strategy. ___________________________ SDOH (Social Determinants of Health) assessments performed: Yes.  Future Appointments  Date Time Provider [place]  01/27/2020  2:20 PM Fay Records, MD [facility]  02/09/2020  9:00 AM [name], MD [facility]  05/31/2020  1:00 PM [facility] PHARMACIST [facility]   Visit follow-up:  . CPA follow-up: 3 mont gen/adh. . [place] follow-up: 6 month telephone visit.  Madelin Rear, Pharm.D., BCGP Clinical Pharmacist [facility] [phone]

## 2019-11-28 NOTE — Patient Instructions (Signed)
Please review care plan below and call me at [phone] with any questions!  Thank you, Edyth Gunnels., Clinical Pharmacist  Goals Addressed            This Visit's Progress   . PharmD Care Plan   On track    CARE PLAN ENTRY  Current Barriers:  . Chronic Disease Management support, education, and care coordination needs related to CAD and HLD  Pharmacist Clinical Goal(s):  [name] LDL <70 . Receive Tdap vaccine within 90 days  Interventions: . Comprehensive medication review performed.  Patient Self Care Activities:  . Patient verbalizes understanding of plan to get Tdap at local pharmacy  Initial goal documentation       The patient verbalized understanding of instructions provided today and agreed to receive a mailed copy of patient instruction and/or educational materials. Telephone follow up appointment with pharmacy team member scheduled for: See next appointment with "Care Management Staff" under "What's Next" below.   Madelin Rear, Pharm.D., BCGP Clinical Pharmacist [facility] [phone]

## 2019-12-05 MED ORDER — MELOXICAM 7.5 MG PO TABS: ORAL_TABLET | ORAL | 3 refills | Status: DC

## 2019-12-05 NOTE — Addendum Note (Signed)
Addended by: [name] on: 12/05/2019 07:22 AM   Modules accepted: Orders

## 2020-01-27 ENCOUNTER — Other Ambulatory Visit: Payer: Self-pay

## 2020-01-27 ENCOUNTER — Encounter: Payer: Self-pay | Admitting: Internal Medicine

## 2020-01-27 ENCOUNTER — Ambulatory Visit: Payer: Medicare Other | Admitting: Internal Medicine

## 2020-01-27 ENCOUNTER — Telehealth: Payer: Self-pay | Admitting: Radiology

## 2020-01-27 VITALS — BP 134/68 | HR 83 | Ht 61.5 in | Wt 155.0 lb

## 2020-01-27 DIAGNOSIS — R002 Palpitations: Secondary | ICD-10-CM | POA: Diagnosis not present

## 2020-01-27 DIAGNOSIS — I499 Cardiac arrhythmia, unspecified: Secondary | ICD-10-CM | POA: Insufficient documentation

## 2020-01-27 NOTE — Patient Instructions (Signed)
Medication Instructions:  No changes *If you need a refill on your cardiac medications before your next appointment, please call your pharmacy*   Lab Work: none If you have labs (blood work) drawn today and your tests are completely normal, you will receive your results only by:  [place] (if you have MyChart) OR  A paper copy in the mail If you have any lab test that is abnormal or we need to change your treatment, we will call you to review the results.   Testing/Procedures: [place]- Long Term Monitor Instructions   Your physician has requested you wear your ZIO patch monitor 14 days.   This is a single patch monitor.  [employer] supplies one patch monitor per enrollment.  Additional stickers are not available.   Please do not apply patch if you will be having a Nuclear Stress Test, Echocardiogram, Cardiac CT, MRI, or Chest Xray during the time frame you would be wearing the monitor. The patch cannot be worn during these tests.  You cannot remove and re-apply the ZIO XT patch monitor.   Your ZIO patch monitor will be sent USPS Priority mail from [facility] directly to your home address. The monitor may also be mailed to a PO BOX if home delivery is not available.   It may take 3-5 days to receive your monitor after you have been enrolled.   Once you have received you monitor, please review enclosed instructions.  Your monitor has already been registered assigning a specific monitor serial # to you.   Applying the monitor   Shave hair from upper left chest.   Hold abrader disc by orange tab.  Rub abrader in 40 strokes over left upper chest as indicated in your monitor instructions.   Clean area with 4 enclosed alcohol pads .  Use all pads to assure are is cleaned thoroughly.  Let dry.   Apply patch as indicated in monitor instructions.  Patch will be place under collarbone on left side of chest with arrow pointing upward.   Rub patch adhesive wings for 2  minutes.Remove white label marked "1".  Remove white label marked "2".  Rub patch adhesive wings for 2 additional minutes.   While looking in a mirror, press and release button in center of patch.  A small green light will flash 3-4 times .  This will be your only indicator the monitor has been turned on.     Do not shower for the first 24 hours.  You may shower after the first 24 hours.   Press button if you feel a symptom. You will hear a small click.  Record Date, Time and Symptom in the Patient Log Book.   When you are ready to remove patch, follow instructions on last 2 pages of Patient Log Book.  Stick patch monitor onto last page of Patient Log Book.   Place Patient Log Book in [place] box.  Use locking tab on box and tape box closed securely.  The [employer] has [employer] on it.  Please place in mailbox as soon as possible.  Your physician should have your test results approximately 7 days after the monitor has been mailed back to [facility].   Call [place] at [phone] if you have questions regarding your ZIO XT patch monitor.  Call them immediately if you see an orange light blinking on your monitor.   If your monitor falls off in less than 4 days contact our Monitor  department at [phone].  If your monitor becomes loose or falls off after 4 days call [employer] at [phone] for suggestions on securing your monitor.    Follow-Up: At [facility], you and your health needs are our priority.  As part of our continuing mission to provide you with exceptional heart care, we have created designated Provider Care Teams.  These Care Teams include your primary Cardiologist (physician) and Advanced Practice Providers (APPs -  Physician Assistants and Nurse Practitioners) who all work together to provide you with the care you need, when you need it.  Your next appointment:   10 month(s)  The format for your next appointment:   In  Person  Provider:   You may see Dorris Carnes, MD or one of the following Advanced Practice Providers on your designated Care Team:    Richardson Dopp, PA-C  Robbie Lis, [place]   Other Instructions

## 2020-01-27 NOTE — Telephone Encounter (Signed)
Enrolled patient for a 14 day Zio XT Monitor to be mailed to patients home  

## 2020-01-27 NOTE — Progress Notes (Signed)
Cardiology Office Note   Date:  01/27/2020   ID:  Phelicia Dantes, DOB Mar 07, 1945, MRN [id number]  PCP:  [name], MD  Cardiologist:   Dorris Carnes, MD   FU of HL and CAD      History of Present Illness: Denise Ewing is a 74 y.o. female with a history of atypical CP, CAD by CT angiogram (Stenosis 50% or less LAD; calcium score 126) She was seen in ED on 12/16/18 for CP   R/O for MI  Sent home     I last saw the pt in Jan 2021  Since seen she has done well  She deneis CP   Does not occsaionally feeling her heart pause  Occsaional dizziness   Feels she has to cough to break spell        Current Meds  Medication Sig  . aspirin 81 MG tablet Take 81 mg by mouth daily.  [name] atorvastatin (LIPITOR) 10 MG tablet Take 1 tablet (10 mg total) by mouth daily.  . Calcium Citrate-Vitamin D (CITRACAL/VITAMIN D) 250-200 MG-UNIT TABS Take 2 tablets by mouth at bedtime.   . Coenzyme Q10 (CO Q-10 PO) Take 1 capsule by mouth every morning.  . conjugated estrogens (PREMARIN) vaginal cream Place 1.5 g vaginally once a week. Take one tablet by mouth on Sundays per patient  . ezetimibe (ZETIA) 10 MG tablet TAKE 1/2 TABLET BY MOUTH EVERY DAY  . famotidine (PEPCID) 20 MG tablet Take one tablet after dinner  . GLUCOSAMINE CHONDROITIN COMPLX PO Take 2 tablets by mouth daily.   . meloxicam (MOBIC) 7.5 MG tablet 1-2 tabs daily as needed for pain  . Multiple Vitamins-Minerals (ALIVE WOMENS 50+ PO) Take 1 tablet by mouth every morning.  [name] OVER THE COUNTER MEDICATION Take 1 tablet by mouth daily. Tumeric and cumin   . pantoprazole (PROTONIX) 40 MG tablet TAKE 1 TABLET BY MOUTH 2 TIMES DAILY BEFORE A MEAL  . raloxifene (EVISTA) 60 MG tablet Take 1 tablet (60 mg total) by mouth daily.  . timolol (TIMOPTIC) 0.5 % ophthalmic solution 1 drop 2 (two) times daily.  . TRAVATAN Z 0.004 % SOLN ophthalmic solution Place 1 drop into both eyes at bedtime.      Allergies:   Amoxicillin   Past Medical History:   Diagnosis Date  . Allergy    post nasal drip  . Arthritis   . Blood transfusion without reported diagnosis    with hysterectomy 11 years ago  . GERD (gastroesophageal reflux disease)   . Glaucoma    bilateral eyes  . History of colonic polyps   . Melanoma of thigh ([place])   . Osteoporosis     Past Surgical History:  Procedure Laterality Date  . Colon polyps removed    . Melanoma removed     R thigh  . TOTAL ABDOMINAL HYSTERECTOMY W/ BILATERAL SALPINGOOPHORECTOMY     uterine fibroids     Social History:  The patient  reports that she has never smoked. She has never used smokeless tobacco. She reports current alcohol use. She reports that she does not use drugs.   Family History:  The patient's family history includes Breast cancer (age of onset: 21) in her mother; CAD in her sister; Cancer in her mother; Hypertension in her mother; Hypothyroidism in her sister; Osteoporosis in her mother; Rectal cancer in her mother.    ROS:  Please see the history of present illness. All other systems are reviewed and  Negative to  the above problem except as noted.    PHYSICAL EXAM: VS:  BP 134/68   Pulse 83   Ht 5' 1.5" (1.562 m)   Wt 155 lb (70.3 kg)   SpO2 95%   BMI 28.81 kg/m   GEN:  73 yo in no acute distress  HEENT: normal  Neck: JVP is normal  No, carotid bruits Cardiac: RRR; no murmurs.  No  LE   edema  Respiratory:  clear to auscultation bilaterally  GI: soft, nontender, nondistended, + BS  No hepatomegaly  MS: no deformity Moving all extremities   Skin: warm and dry, no rash Neuro:  Strength and sensation are intact Psych: euthymic mood, full affect   EKG:  EKG is not ordered today.   Lipid Panel    Component Value Date/Time   CHOL 119 08/16/2019 0935   CHOL 110 04/10/2016 1143   CHOL 96 (L) 12/21/2014 0824   TRIG 77.0 08/16/2019 0935   TRIG 66 12/21/2014 0824   HDL 48.40 08/16/2019 0935   HDL 54 04/10/2016 1143   HDL 45 (L) 12/21/2014 0824   CHOLHDL 2  08/16/2019 0935   VLDL 15.4 08/16/2019 0935   LDLCALC 55 08/16/2019 0935   LDLCALC 45 04/10/2016 1143   LDLCALC 38 12/21/2014 0824      Wt Readings from Last 3 Encounters:  01/27/20 155 lb (70.3 kg)  09/12/19 155 lb 12.8 oz (70.7 kg)  08/16/19 152 lb 6 oz (69.1 kg)      ASSESSMENT AND PLAN:   1  CAD Pt with mild dz  Continue to monitor risk factors  Pt symptom free    2  Pauses   Will set up for monitor to capture   3 HL  Last LDL 55  Keep on lipitro  4  HTN   BP is controlled  Follow   Further Rx based on monitor results   Otherwise f/u in clinic in 10 months  Current medicines are reviewed at length with the patient today.  The patient does not have concerns regarding medicines.  Signed, Dorris Carnes, MD  01/27/2020 2:59 PM    [place], [place]  [address] Phone: [phone]; Fax: [phone]

## 2020-02-01 ENCOUNTER — Ambulatory Visit (INDEPENDENT_AMBULATORY_CARE_PROVIDER_SITE_OTHER): Payer: Medicare Other

## 2020-02-01 DIAGNOSIS — R002 Palpitations: Secondary | ICD-10-CM | POA: Diagnosis not present

## 2020-02-09 ENCOUNTER — Encounter: Payer: Self-pay | Admitting: Family Medicine

## 2020-02-09 ENCOUNTER — Ambulatory Visit (INDEPENDENT_AMBULATORY_CARE_PROVIDER_SITE_OTHER): Payer: Medicare Other | Admitting: Family Medicine

## 2020-02-09 ENCOUNTER — Other Ambulatory Visit: Payer: Self-pay

## 2020-02-09 VITALS — BP 118/80 | HR 71 | Temp 97.5°F | Resp 19 | Ht 61.0 in | Wt 154.6 lb

## 2020-02-09 DIAGNOSIS — F4321 Adjustment disorder with depressed mood: Secondary | ICD-10-CM | POA: Diagnosis not present

## 2020-02-09 DIAGNOSIS — E785 Hyperlipidemia, unspecified: Secondary | ICD-10-CM

## 2020-02-09 MED ORDER — SERTRALINE HCL 25 MG PO TABS: 25.0000 mg | ORAL_TABLET | Freq: Every day | ORAL | 3 refills | Status: DC

## 2020-02-09 NOTE — Assessment & Plan Note (Signed)
Chronic problem.  Tolerating Lipitor and Zetia w/o difficulty.  Check labs.  Adjust meds prn

## 2020-02-09 NOTE — Patient Instructions (Addendum)
Follow up in 1 month to recheck mood Schedule a lab visit at your convenience and at the location of your choice (or go to [place]- no appt needed) or we can do it at your next appt START the low dose Sertraline daily to help w/ mood It's ok to set boundaries- especially in your own home Call with any questions or concerns Stay Safe!  Stay Healthy! Happy Holidays!
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
## 2020-02-10 ENCOUNTER — Other Ambulatory Visit (INDEPENDENT_AMBULATORY_CARE_PROVIDER_SITE_OTHER): Payer: Medicare Other

## 2020-02-10 DIAGNOSIS — E785 Hyperlipidemia, unspecified: Secondary | ICD-10-CM

## 2020-02-10 LAB — LIPID PANEL
Cholesterol: 138 mg/dL (ref 0–200)
HDL: 55.3 mg/dL (ref >39.00)
LDL Cholesterol: 66 mg/dL (ref 0–99)
NonHDL: 82.41
Total CHOL/HDL Ratio: 2
Triglycerides: 84 mg/dL (ref 0.0–149.0)
VLDL: 16.8 mg/dL (ref 0.0–40.0)

## 2020-02-10 LAB — BASIC METABOLIC PANEL
BUN: 23 mg/dL (ref 6–23)
CO2: 28 mEq/L (ref 19–32)
Calcium: 9.5 mg/dL (ref 8.4–10.5)
Chloride: 103 mEq/L (ref 96–112)
Creatinine, Ser: 0.87 mg/dL (ref 0.40–1.20)
GFR: 65.29 mL/min (ref >60.00)
Glucose, Bld: 106 mg/dL — ABNORMAL HIGH (ref 70–99)
Potassium: 4.3 mEq/L (ref 3.5–5.1)
Sodium: 137 mEq/L (ref 135–145)

## 2020-02-10 LAB — HEPATIC FUNCTION PANEL
ALT: 17 U/L (ref 0–35)
AST: 21 U/L (ref 0–37)
Albumin: 4.4 g/dL (ref 3.5–5.2)
Alkaline Phosphatase: 57 U/L (ref 39–117)
Bilirubin, Direct: 0.2 mg/dL (ref 0.0–0.3)
Total Bilirubin: 0.6 mg/dL (ref 0.2–1.2)
Total Protein: 7 g/dL (ref 6.0–8.3)

## 2020-02-13 ENCOUNTER — Other Ambulatory Visit: Payer: Self-pay | Admitting: Family Medicine

## 2020-02-14 DIAGNOSIS — Z124 Encounter for screening for malignant neoplasm of cervix: Secondary | ICD-10-CM | POA: Diagnosis not present

## 2020-02-22 ENCOUNTER — Encounter: Payer: Self-pay | Admitting: Family Medicine

## 2020-02-22 DIAGNOSIS — R002 Palpitations: Secondary | ICD-10-CM | POA: Diagnosis not present

## 2020-02-27 ENCOUNTER — Ambulatory Visit (INDEPENDENT_AMBULATORY_CARE_PROVIDER_SITE_OTHER): Payer: Medicare Other

## 2020-02-27 ENCOUNTER — Other Ambulatory Visit: Payer: Self-pay

## 2020-02-27 VITALS — Ht 61.0 in | Wt 154.0 lb

## 2020-02-27 DIAGNOSIS — Z0000 Encounter for general adult medical examination without abnormal findings: Secondary | ICD-10-CM | POA: Diagnosis not present

## 2020-02-27 NOTE — Progress Notes (Signed)
Subjective:   Denise Ewing is a 75 y.o. female who presents for Medicare Annual (Subsequent) preventive examination.  I connected with Tanaiya today by telephone and verified that I am speaking with the correct person using two identifiers. Location patient: home Location provider: work Persons participating in the virtual visit: patient, Marine scientist.    I discussed the limitations, risks, security and privacy concerns of performing an evaluation and management service by telephone and the availability of in person appointments. I also discussed with the patient that there may be a patient responsible charge related to this service. The patient expressed understanding and verbally consented to this telephonic visit.    Interactive audio and video telecommunications were attempted between this provider and patient, however failed, due to patient having technical difficulties OR patient did not have access to video capability.  We continued and completed visit with audio only.  Some vital signs may be absent or patient reported.   Time Spent with patient on telephone encounter: 25 minutes   Review of Systems     Cardiac Risk Factors include: advanced age (>13men, >50 women);dyslipidemia     Objective:    Today's Vitals   02/27/20 1415  Weight: 154 lb (69.9 kg)  Height: 5\' 1"  (1.549 m)   Body mass index is 29.1 kg/m.  Advanced Directives 02/27/2020 10/26/2016 01/05/2014  Does Patient Have a Medical Advance Directive? Yes No Yes  Type of Advance Directive Living will - [place]  Does patient want to make changes to medical advance directive? - - No - Patient declined  Copy of [place] in Chart? - - No - copy requested  Would patient like information on creating a medical advance directive? - No - Patient declined -    Current Medications (verified) Outpatient Encounter Medications as of 02/27/2020  Medication Sig  . aspirin 81 MG tablet Take 81 mg  by mouth daily.  [name] atorvastatin (LIPITOR) 10 MG tablet Take 1 tablet (10 mg total) by mouth daily.  . Calcium Citrate-Vitamin D 250-200 MG-UNIT TABS Take 2 tablets by mouth at bedtime.  . Coenzyme Q10 (CO Q-10 PO) Take 1 capsule by mouth every morning.  . conjugated estrogens (PREMARIN) vaginal cream Place 1.5 g vaginally once a week. Take one tablet by mouth on Sundays per patient  . ezetimibe (ZETIA) 10 MG tablet TAKE 1/2 TABLET BY MOUTH EVERY DAY  . famotidine (PEPCID) 20 MG tablet Take one tablet after dinner  . GLUCOSAMINE CHONDROITIN COMPLX PO Take 2 tablets by mouth daily.   . meloxicam (MOBIC) 7.5 MG tablet 1-2 tabs daily as needed for pain  . Multiple Vitamins-Minerals (ALIVE WOMENS 50+ PO) Take 1 tablet by mouth every morning.  [name] OVER THE COUNTER MEDICATION Take 1 tablet by mouth daily. Tumeric and cumin  . pantoprazole (PROTONIX) 40 MG tablet TAKE 1 TABLET BY MOUTH 2 TIMES DAILY BEFORE A MEAL  . raloxifene (EVISTA) 60 MG tablet TAKE 1 TABLET BY MOUTH EVERY DAY  . timolol (TIMOPTIC) 0.5 % ophthalmic solution 1 drop 2 (two) times daily.  . TRAVATAN Z 0.004 % SOLN ophthalmic solution Place 1 drop into both eyes at bedtime.   . sertraline (ZOLOFT) 25 MG tablet Take 1 tablet (25 mg total) by mouth daily. (Patient not taking: Reported on 02/27/2020)   No facility-administered encounter medications on file as of 02/27/2020.    Allergies (verified) Amoxicillin   History: Past Medical History:  Diagnosis Date  . Allergy  post nasal drip  . Arthritis   . Blood transfusion without reported diagnosis    with hysterectomy 11 years ago  . GERD (gastroesophageal reflux disease)   . Glaucoma    bilateral eyes  . History of colonic polyps   . Melanoma of thigh (HCC)   . Osteoporosis    Past Surgical History:  Procedure Laterality Date  . Colon polyps removed    . Melanoma removed     R thigh  . TOTAL ABDOMINAL HYSTERECTOMY W/ BILATERAL SALPINGOOPHORECTOMY     uterine fibroids    Family History  Problem Relation Age of Onset  . Hypertension Mother   . Cancer Mother        breast,anal  . Osteoporosis Mother   . Breast cancer Mother 58  . Rectal cancer Mother   . CAD Sister   . Hypothyroidism Sister   . Colon polyps Neg Hx   . Esophageal cancer Neg Hx   . Stomach cancer Neg Hx    Social History   Socioeconomic History  . Marital status: Married    Spouse name: Not on file  . Number of children: Not on file  . Years of education: Not on file  . Highest education level: Not on file  Occupational History  . Not on file  Tobacco Use  . Smoking status: Never Smoker  . Smokeless tobacco: Never Used  Vaping Use  . Vaping Use: Never used  Substance and Sexual Activity  . Alcohol use: Yes    Comment:  occasionally  . Drug use: No  . Sexual activity: Not on file  Other Topics Concern  . Not on file  Social History Narrative  . Not on file   Social Determinants of Health   Financial Resource Strain: Low Risk   . Difficulty of Paying Living Expenses: Not hard at all  Food Insecurity: No Food Insecurity  . Worried About Programme researcher, broadcasting/film/video in the Last Year: Never true  . Ran Out of Food in the Last Year: Never true  Transportation Needs: No Transportation Needs  . Lack of Transportation (Medical): No  . Lack of Transportation (Non-Medical): No  Physical Activity: Insufficiently Active  . Days of Exercise per Week: 5 days  . Minutes of Exercise per Session: 20 min  Stress: No Stress Concern Present  . Feeling of Stress : Only a little  Social Connections: Moderately Integrated  . Frequency of Communication with Friends and Family: More than three times a week  . Frequency of Social Gatherings with Friends and Family: More than three times a week  . Attends Religious Services: Never  . Active Member of Clubs or Organizations: Yes  . Attends Banker Meetings: More than 4 times per year  . Marital Status: Married    Tobacco  Counseling Counseling given: Not Answered   Clinical Intake:  Pre-visit preparation completed: Yes  Pain : No/denies pain     Nutritional Status: BMI 25 -29 Overweight Nutritional Risks: None Diabetes: No  How often do you need to have someone help you when you read instructions, pamphlets, or other written materials from your doctor or pharmacy?: 1 - Never  Diabetic?No  Interpreter Needed?: No  Information entered by :: Thomasenia Sales LPN   Activities of Daily Living In your present state of health, do you have any difficulty performing the following activities: 02/27/2020 02/09/2020  Hearing? Y N  Comment mild hearing loss -  Vision? N N  Difficulty concentrating or  making decisions? N N  Walking or climbing stairs? N N  Dressing or bathing? N N  Doing errands, shopping? N N  Preparing Food and eating ? N -  Using the Toilet? N -  In the past six months, have you accidently leaked urine? N -  Do you have problems with loss of bowel control? N -  Managing your Medications? N -  Managing your Finances? N -  Housekeeping or managing your Housekeeping? N -  Some recent data might be hidden    Patient Care Team: [name], MD as PCP - General Fay Records, MD as PCP - Cardiology (Cardiology) Linda Hedges, DO as Consulting Physician (Obstetrics and Gynecology) Marygrace Drought, MD as Consulting Physician (Ophthalmology) Milus Banister, MD as Attending Physician (Gastroenterology) Madelin Rear, [facility] (Pharmacist)  Indicate any recent Medical Services you may have received from other than [facility] providers in the past year (date may be approximate).     Assessment:   This is a routine wellness examination for [place].  Hearing/Vision screen  Hearing Screening   125Hz  250Hz  500Hz  1000Hz  2000Hz  3000Hz  4000Hz  6000Hz  8000Hz   Right ear:           Left ear:           Comments: Mild hearing loss  Vision Screening Comments: Reading glasses Last eye  exam-11/2019-Dr.Tanner  Dietary issues and exercise activities discussed: Current Exercise Habits: Home exercise routine, Type of exercise: walking, Time (Minutes): 20, Frequency (Times/Week): 5, Weekly Exercise (Minutes/Week): 100, Exercise limited by: None identified  Goals    . Patient Stated     Increase activity    . PharmD Care Plan     CARE PLAN ENTRY  Current Barriers:  . Chronic Disease Management support, education, and care coordination needs related to CAD and HLD  Pharmacist Clinical Goal(s):  [name] LDL <70 . Receive Tdap vaccine within 90 days  Interventions: . Comprehensive medication review performed.  Patient Self Care Activities:  . Patient verbalizes understanding of plan to get Tdap at local pharmacy  Initial goal documentation       Depression Screen PHQ 2/9 Scores 02/27/2020 02/09/2020 08/16/2019 06/02/2019 02/09/2019 08/11/2018 02/09/2018  PHQ - 2 Score 0 0 0 0 0 0 0  PHQ- 9 Score - 6 0 - 0 0 0  Exception Documentation - - - - - - -    Fall Risk Fall Risk  02/27/2020 02/09/2020 08/16/2019 08/16/2019 06/02/2019  Falls in the past year? 0 0 0 0 0  Number falls in past yr: 0 0 0 0 0  Injury with Fall? 0 0 0 0 0  Comment - - - - -  Risk for fall due to : - No Fall Risks - - -  Follow up Falls prevention discussed - Falls evaluation completed Falls evaluation completed Falls evaluation completed    [place]:  Any stairs in or around the home? Yes  If so, are there any without handrails? No  Home free of loose throw rugs in walkways, pet beds, electrical cords, etc? Yes  Adequate lighting in your home to reduce risk of falls? Yes   ASSISTIVE DEVICES UTILIZED TO PREVENT FALLS:  Life alert? No  Use of a cane, walker or w/c? No  Grab bars in the bathroom? No  Shower chair or bench in shower? No  Elevated toilet seat or a handicapped toilet? No   TIMED UP AND GO:  Was the test performed?  No . Phone visit   Cognitive  Function:Normal cognitive status assessed by this Nurse Health Advisor. No abnormalities found.          Immunizations Immunization History  Administered Date(s) Administered  . Fluad Quad(high Dose 65+) 11/04/2019  . Influenza Split 12/16/2011  . Influenza Whole 12/05/2009, 11/04/2010  . Influenza, High Dose Seasonal PF 11/30/2012, 10/30/2018  . Influenza,inj,Quad PF,6+ Mos 11/15/2013, 11/28/2014, 10/15/2015, 12/16/2016, 12/03/2017  . Influenza-Unspecified 10/25/2012  . PFIZER SARS-COV-2 Vaccination 04/01/2019, 04/26/2019, 11/22/2019  . Pneumococcal Conjugate-13 01/11/2014  . Pneumococcal Polysaccharide-23 09/05/2011  . Tdap 02/09/2007  . Zoster Recombinat (Shingrix) 08/22/2017, 10/30/2017    TDAP status: Due, Education has been provided regarding the importance of this vaccine. Advised may receive this vaccine at local pharmacy or Health Dept. Aware to provide a copy of the vaccination record if obtained from local pharmacy or Health Dept. Verbalized acceptance and understanding.  Flu Vaccine status: Up to date  Pneumococcal vaccine status: Up to date  Covid-19 vaccine status: Completed vaccines  Qualifies for Shingles Vaccine? No   Zostavax completed No   Shingrix Completed?: Yes  Screening Tests Health Maintenance  Topic Date Due  . TETANUS/TDAP  02/08/2017  . COLONOSCOPY (Pts 45-88yrs Insurance coverage will need to be confirmed)  02/24/2017  . MAMMOGRAM  02/27/2020 (Originally 01/28/2020)  . Hepatitis C Screening  08/15/2020 (Originally Nov 13, 1945)  . COLON CANCER SCREENING ANNUAL FOBT  02/29/2020  . DEXA SCAN  03/02/2021  . INFLUENZA VACCINE  Completed  . COVID-19 Vaccine  Completed  . PNA vac Low Risk Adult  Completed    Health Maintenance  Health Maintenance Due  Topic Date Due  . TETANUS/TDAP  02/08/2017  . COLONOSCOPY (Pts 45-37yrs Insurance coverage will need to be confirmed)  02/24/2017    Colorectal cancer screening: Due-Patient to call GI to  schedule colonoscopy.   Mammogram status: Scheduled for 03/20/20  Bone Density status: Completed 03/03/2019. Results reflect: Bone density results: OSTEOPENIA. Repeat every 2 years.  Lung Cancer Screening: (Low Dose CT Chest recommended if Age 22-80 years, 30 pack-year currently smoking OR have quit w/in 15years.) does not qualify.     Additional Screening:  Hepatitis C Screening: does qualify; Discuss with PCP  Vision Screening: Recommended annual ophthalmology exams for early detection of glaucoma and other disorders of the eye. Is the patient up to date with their annual eye exam?  Yes  Who is the provider or what is the name of the office in which the patient attends annual eye exams? Dr. Satira Sark   Dental Screening: Recommended annual dental exams for proper oral hygiene  Community Resource Referral / Chronic Care Management: CRR required this visit?  No   CCM required this visit?  No      Plan:     I have personally reviewed and noted the following in the patient's chart:   . Medical and social history . Use of alcohol, tobacco or illicit drugs  . Current medications and supplements . Functional ability and status . Nutritional status . Physical activity . Advanced directives . List of other physicians . Hospitalizations, surgeries, and ER visits in previous 12 months . Vitals . Screenings to include cognitive, depression, and falls . Referrals and appointments  In addition, I have reviewed and discussed with patient certain preventive protocols, quality metrics, and best practice recommendations. A written personalized care plan for preventive services as well as general preventive health recommendations were provided to patient.   Due to this being a telephonic visit,  the after visit summary with patients personalized plan was offered to patient via mail or my-chart.  Patient would like to access on my-chart.   Marta Antu, LPN   [id number]  Nurse Health  Advisor  Nurse Notes: None

## 2020-02-27 NOTE — Patient Instructions (Signed)
[name] , Thank you for taking time to complete your Medicare Wellness Visit. I appreciate your ongoing commitment to your health goals. Please review the following plan we discussed and let me know if I can assist you in the future.   Screening recommendations/referrals: Colonoscopy: Due. Per our conversation, you will call GI to schedule. Mammogram: Scheduled for 03/20/2020 Bone Density: Completed 03/03/2019-Due 03/02/2021 Recommended yearly ophthalmology/optometry visit for glaucoma screening and checkup Recommended yearly dental visit for hygiene and checkup  Vaccinations: Influenza vaccine: Up to date Pneumococcal vaccine: Completed vaccines Tdap vaccine: Discuss with pharmacy Shingles vaccine: Completed vaccines   Covid-19:Completed vaccines  Advanced directives: Please bring a copy for your chart  Conditions/risks identified: See problem list  Next appointment: Follow up in one year for your annual wellness visit 03/04/2021 @ 2:15   Preventive Care 65 Years and Older, Female Preventive care refers to lifestyle choices and visits with your health care provider that can promote health and wellness. What does preventive care include?  A yearly physical exam. This is also called an annual well check.  Dental exams once or twice a year.  Routine eye exams. Ask your health care provider how often you should have your eyes checked.  Personal lifestyle choices, including:  Daily care of your teeth and gums.  Regular physical activity.  Eating a healthy diet.  Avoiding tobacco and drug use.  Limiting alcohol use.  Practicing safe sex.  Taking low-dose aspirin every day.  Taking vitamin and mineral supplements as recommended by your health care provider. What happens during an annual well check? The services and screenings done by your health care provider during your annual well check will depend on your age, overall health, lifestyle risk factors, and family history of  disease. Counseling  Your health care provider may ask you questions about your:  Alcohol use.  Tobacco use.  Drug use.  Emotional well-being.  Home and relationship well-being.  Sexual activity.  Eating habits.  History of falls.  Memory and ability to understand (cognition).  Work and work Astronomer.  Reproductive health. Screening  You may have the following tests or measurements:  Height, weight, and BMI.  Blood pressure.  Lipid and cholesterol levels. These may be checked every 5 years, or more frequently if you are over 72 years old.  Skin check.  Lung cancer screening. You may have this screening every year starting at age 47 if you have a 30-pack-year history of smoking and currently smoke or have quit within the past 15 years.  Fecal occult blood test (FOBT) of the stool. You may have this test every year starting at age 68.  Flexible sigmoidoscopy or colonoscopy. You may have a sigmoidoscopy every 5 years or a colonoscopy every 10 years starting at age 37.  Hepatitis C blood test.  Hepatitis B blood test.  Sexually transmitted disease (STD) testing.  Diabetes screening. This is done by checking your blood sugar (glucose) after you have not eaten for a while (fasting). You may have this done every 1-3 years.  Bone density scan. This is done to screen for osteoporosis. You may have this done starting at age 10.  Mammogram. This may be done every 1-2 years. Talk to your health care provider about how often you should have regular mammograms. Talk with your health care provider about your test results, treatment options, and if necessary, the need for more tests. Vaccines  Your health care provider may recommend certain vaccines, such as:  Influenza vaccine.  This is recommended every year.  Tetanus, diphtheria, and acellular pertussis (Tdap, Td) vaccine. You may need a Td booster every 10 years.  Zoster vaccine. You may need this after age  40.  Pneumococcal 13-valent conjugate (PCV13) vaccine. One dose is recommended after age 73.  Pneumococcal polysaccharide (PPSV23) vaccine. One dose is recommended after age 31. Talk to your health care provider about which screenings and vaccines you need and how often you need them. This information is not intended to replace advice given to you by your health care provider. Make sure you discuss any questions you have with your health care provider. Document Released: 03/09/2015 Document Revised: 10/31/2015 Document Reviewed: 12/12/2014 Elsevier Interactive Patient Education  2017 [place] Prevention in the Home Falls can cause injuries. They can happen to people of all ages. There are many things you can do to make your home safe and to help prevent falls. What can I do on the outside of my home?  Regularly fix the edges of walkways and driveways and fix any cracks.  Remove anything that might make you trip as you walk through a door, such as a raised step or threshold.  Trim any bushes or trees on the path to your home.  Use bright outdoor lighting.  Clear any walking paths of anything that might make someone trip, such as rocks or tools.  Regularly check to see if handrails are loose or broken. Make sure that both sides of any steps have handrails.  Any raised decks and porches should have guardrails on the edges.  Have any leaves, snow, or ice cleared regularly.  Use sand or salt on walking paths during winter.  Clean up any spills in your garage right away. This includes oil or grease spills. What can I do in the bathroom?  Use night lights.  Install grab bars by the toilet and in the tub and shower. Do not use towel bars as grab bars.  Use non-skid mats or decals in the tub or shower.  If you need to sit down in the shower, use a plastic, non-slip stool.  Keep the floor dry. Clean up any water that spills on the floor as soon as it happens.  Remove  soap buildup in the tub or shower regularly.  Attach bath mats securely with double-sided non-slip rug tape.  Do not have throw rugs and other things on the floor that can make you trip. What can I do in the bedroom?  Use night lights.  Make sure that you have a light by your bed that is easy to reach.  Do not use any sheets or blankets that are too big for your bed. They should not hang down onto the floor.  Have a firm chair that has side arms. You can use this for support while you get dressed.  Do not have throw rugs and other things on the floor that can make you trip. What can I do in the kitchen?  Clean up any spills right away.  Avoid walking on wet floors.  Keep items that you use a lot in easy-to-reach places.  If you need to reach something above you, use a strong step stool that has a grab bar.  Keep electrical cords out of the way.  Do not use floor polish or wax that makes floors slippery. If you must use wax, use non-skid floor wax.  Do not have throw rugs and other things on the floor that can make  you trip. What can I do with my stairs?  Do not leave any items on the stairs.  Make sure that there are handrails on both sides of the stairs and use them. Fix handrails that are broken or loose. Make sure that handrails are as long as the stairways.  Check any carpeting to make sure that it is firmly attached to the stairs. Fix any carpet that is loose or worn.  Avoid having throw rugs at the top or bottom of the stairs. If you do have throw rugs, attach them to the floor with carpet tape.  Make sure that you have a light switch at the top of the stairs and the bottom of the stairs. If you do not have them, ask someone to add them for you. What else can I do to help prevent falls?  Wear shoes that:  Do not have high heels.  Have rubber bottoms.  Are comfortable and fit you well.  Are closed at the toe. Do not wear sandals.  If you use a  stepladder:  Make sure that it is fully opened. Do not climb a closed stepladder.  Make sure that both sides of the stepladder are locked into place.  Ask someone to hold it for you, if possible.  Clearly mark and make sure that you can see:  Any grab bars or handrails.  First and last steps.  Where the edge of each step is.  Use tools that help you move around (mobility aids) if they are needed. These include:  Canes.  Walkers.  Scooters.  Crutches.  Turn on the lights when you go into a dark area. Replace any light bulbs as soon as they burn out.  Set up your furniture so you have a clear path. Avoid moving your furniture around.  If any of your floors are uneven, fix them.  If there are any pets around you, be aware of where they are.  Review your medicines with your doctor. Some medicines can make you feel dizzy. This can increase your chance of falling. Ask your doctor what other things that you can do to help prevent falls. This information is not intended to replace advice given to you by your health care provider. Make sure you discuss any questions you have with your health care provider. Document Released: 12/07/2008 Document Revised: 07/19/2015 Document Reviewed: 03/17/2014 Elsevier Interactive Patient Education  2017 [employer].

## 2020-03-05 ENCOUNTER — Telehealth: Payer: Self-pay | Admitting: Internal Medicine

## 2020-03-05 ENCOUNTER — Encounter: Payer: Self-pay | Admitting: Internal Medicine

## 2020-03-05 MED ORDER — METOPROLOL SUCCINATE ER 25 MG PO TB24: ORAL_TABLET | ORAL | 3 refills | Status: DC

## 2020-03-05 NOTE — Telephone Encounter (Signed)
Sent prescription to pharmacy. Pt will call/message in couple weeks with how she is feeling.   Reviewed results with patient. There were other times she sensed palpitations that she was unable to record right at that moment.  At times she would become lightheaded.  She will start with 1/2 Toprol with plan to increase if symptoms do not improve.    She wanted to let Dr. Harrington Challenger know both her sister and her brother have been diagnosed with atrial fibrillation.  Family history updated.

## 2020-03-05 NOTE — Telephone Encounter (Signed)
-----   Message from Dorris Carnes V, MD sent at 02/25/2020 10:56 PM EST ----- Sinus rhythm   Short bursts of SVT noted   No pauses ON one burst palpitation sensed. May be sensing perioad when HR back to normal Recomm:  Could try low dose toprol   Start Toprol XL 1/2  of 25 mg     Can increase to 1 tab.  Follow

## 2020-03-05 NOTE — Telephone Encounter (Signed)
Patient is returning Denise Ewing's call about results. Please advise.

## 2020-03-07 ENCOUNTER — Telehealth: Payer: Self-pay

## 2020-03-07 DIAGNOSIS — H04123 Dry eye syndrome of bilateral lacrimal glands: Secondary | ICD-10-CM | POA: Diagnosis not present

## 2020-03-07 DIAGNOSIS — H401123 Primary open-angle glaucoma, left eye, severe stage: Secondary | ICD-10-CM | POA: Diagnosis not present

## 2020-03-07 NOTE — Chronic Care Management (AMB) (Signed)
Chronic Care Management Pharmacy Assistant   Name: Denise Ewing  MRN: [id number] DOB: 01/10/1946  Reason for Encounter: Disease State/General  Patient Questions:  1.  Have you seen any other providers since your last visit? Yes, patient saw her gynecologist Vernie Ammons, DO on 02/14/2020, she had her Medicare Annual Wellness with Caroleen Hamman, LPN on 5/73/2202 and patient had an appointment with her Ophthalmologist today on 03/08/2019 with Marygrace Drought, MD.   2.  Any changes in your medicines or health? Patient states she has not had any changes to her medications or health.    PCP : [name], MD  Allergies:   Allergies  Allergen Reactions  . Amoxicillin Hives    Did it involve swelling of the face/tongue/throat, SOB, or low BP? No Did it involve sudden or severe rash/hives, skin peeling, or any reaction on the inside of your mouth or nose? Yes Did you need to seek medical attention at a hospital or doctor's office? No When did it last happen?>32yrs ago  If all above answers are "NO", may proceed with cephalosporin use.    Medications: Outpatient Encounter Medications as of 03/07/2020  Medication Sig  . aspirin 81 MG tablet Take 81 mg by mouth daily.  [name] atorvastatin (LIPITOR) 10 MG tablet Take 1 tablet (10 mg total) by mouth daily.  . Calcium Citrate-Vitamin D 250-200 MG-UNIT TABS Take 2 tablets by mouth at bedtime.  . Coenzyme Q10 (CO Q-10 PO) Take 1 capsule by mouth every morning.  . conjugated estrogens (PREMARIN) vaginal cream Place 1.5 g vaginally once a week. Take one tablet by mouth on Sundays per patient  . ezetimibe (ZETIA) 10 MG tablet TAKE 1/2 TABLET BY MOUTH EVERY DAY  . famotidine (PEPCID) 20 MG tablet Take one tablet after dinner  . GLUCOSAMINE CHONDROITIN COMPLX PO Take 2 tablets by mouth daily.   . meloxicam (MOBIC) 7.5 MG tablet 1-2 tabs daily as needed for pain  . metoprolol succinate (TOPROL XL) 25 MG 24 hr tablet Take 0.5 (12.5 mg)_to 1  tablet (25 mg) daily  . Multiple Vitamins-Minerals (ALIVE WOMENS 50+ PO) Take 1 tablet by mouth every morning.  [name] OVER THE COUNTER MEDICATION Take 1 tablet by mouth daily. Tumeric and cumin  . pantoprazole (PROTONIX) 40 MG tablet TAKE 1 TABLET BY MOUTH 2 TIMES DAILY BEFORE A MEAL  . raloxifene (EVISTA) 60 MG tablet TAKE 1 TABLET BY MOUTH EVERY DAY  . sertraline (ZOLOFT) 25 MG tablet Take 1 tablet (25 mg total) by mouth daily. (Patient not taking: Reported on 02/27/2020)  . timolol (TIMOPTIC) 0.5 % ophthalmic solution 1 drop 2 (two) times daily.  . TRAVATAN Z 0.004 % SOLN ophthalmic solution Place 1 drop into both eyes at bedtime.    No facility-administered encounter medications on file as of 03/07/2020.    Current Diagnosis: Patient Active Problem List   Diagnosis Date Noted  . Adjustment disorder with depressed mood 02/09/2020  . Irregular heart beat 01/27/2020  . Overweight (BMI 25.0-29.9) 02/09/2018  . Numbness and tingling 08/05/2016  . Osteoporosis 04/18/2014  . Hyperlipidemia 04/18/2014  . CAD (coronary artery disease), native coronary artery 01/11/2014  . Chest pain 01/05/2014  . Pain in the chest   . Hand arthritis 03/23/2013  . Eczema 02/08/2013  . Routine general medical examination at a health care facility 02/06/2012  . Lumbar radicular pain 09/05/2011  . Fatigue 12/23/2010  . Neuropathy of foot 11/04/2010  . GERD 12/05/2009  . DYSPAREUNIA 12/05/2009  .  SKIN CANCER, HX OF 12/05/2009  . COLONIC POLYPS, HX OF 12/05/2009    Have you had any problems recently with your health?  Patient states she has not had any problems recently with her health.  Have you had any problems with your pharmacy?  Patient states she has not had any problems with her pharmacy and states she has an awesome relationship with her pharmacy at [facility].  What issues or side effects are you having with your medications?  Patient states she is not having any issues or side effects from  her medications. Although patient did note that she has not yet started taking Zoloft for her depression and anxiety from Dr. Birdie Riddle.  What would you like me to pass along to Madelin Rear, CPP for them to help you with?   Patient states there is nothing she would like to pass along to the pharmacist Madelin Rear, CPP  What can we do to take care of you better?  Patient states there is nothing we can do to take care of her better as we are "already great".  Theone Murdoch, [place] Pharmacist Assistant [phone]  Follow-Up:  Pharmacist Review

## 2020-03-13 ENCOUNTER — Other Ambulatory Visit: Payer: Self-pay | Admitting: Family Medicine

## 2020-03-19 ENCOUNTER — Other Ambulatory Visit: Payer: Self-pay | Admitting: Internal Medicine

## 2020-03-20 ENCOUNTER — Other Ambulatory Visit: Payer: Self-pay

## 2020-03-20 ENCOUNTER — Encounter: Payer: Self-pay | Admitting: Emergency Medicine

## 2020-03-20 DIAGNOSIS — Z1231 Encounter for screening mammogram for malignant neoplasm of breast: Secondary | ICD-10-CM | POA: Diagnosis not present

## 2020-03-20 LAB — HM MAMMOGRAPHY

## 2020-03-20 MED ORDER — ATORVASTATIN CALCIUM 10 MG PO TABS: 10.0000 mg | ORAL_TABLET | Freq: Every day | ORAL | 2 refills | Status: DC

## 2020-03-20 NOTE — Telephone Encounter (Signed)
Pt's pharmacy is requesting a refill on atorvastatin. Would Dr. Harrington Challenger like to refill this medication? Please address

## 2020-03-28 DIAGNOSIS — Z85820 Personal history of malignant melanoma of skin: Secondary | ICD-10-CM | POA: Diagnosis not present

## 2020-03-28 DIAGNOSIS — L821 Other seborrheic keratosis: Secondary | ICD-10-CM | POA: Diagnosis not present

## 2020-03-29 ENCOUNTER — Ambulatory Visit (INDEPENDENT_AMBULATORY_CARE_PROVIDER_SITE_OTHER): Payer: Medicare Other

## 2020-03-29 DIAGNOSIS — E785 Hyperlipidemia, unspecified: Secondary | ICD-10-CM | POA: Diagnosis not present

## 2020-03-29 DIAGNOSIS — K2100 Gastro-esophageal reflux disease with esophagitis, without bleeding: Secondary | ICD-10-CM

## 2020-03-29 NOTE — Progress Notes (Signed)
Chronic Care Management Pharmacy  Name: Denise Ewing  MRN: [id number] DOB: [birth year]  Chief Complaint/ HPI  Denise Ewing,  75 y.o., female presents for their Follow-Up CCM visit with the clinical pharmacist via telephone due to COVID-19 Pandemic.  PCP : [name], MD  Chronic conditions include:  Encounter Diagnoses  Name Primary?  . Hyperlipidemia, unspecified hyperlipidemia type Yes  . Gastroesophageal reflux disease with esophagitis, unspecified whether hemorrhage    Patient Active Problem List   Diagnosis Date Noted  . Adjustment disorder with depressed mood 02/09/2020  . Irregular heart beat 01/27/2020  . Overweight (BMI 25.0-29.9) 02/09/2018  . Numbness and tingling 08/05/2016  . Osteoporosis 04/18/2014  . Hyperlipidemia 04/18/2014  . CAD (coronary artery disease), native coronary artery 01/11/2014  . Chest pain 01/05/2014  . Pain in the chest   . Hand arthritis 03/23/2013  . Eczema 02/08/2013  . Routine general medical examination at a health care facility 02/06/2012  . Lumbar radicular pain 09/05/2011  . Fatigue 12/23/2010  . Neuropathy of foot 11/04/2010  . GERD 12/05/2009  . DYSPAREUNIA 12/05/2009  . SKIN CANCER, HX OF 12/05/2009  . COLONIC POLYPS, HX OF 12/05/2009   Past Surgical History:  Procedure Laterality Date  . Colon polyps removed    . Melanoma removed     R thigh  . TOTAL ABDOMINAL HYSTERECTOMY W/ BILATERAL SALPINGOOPHORECTOMY     uterine fibroids   Family History  Problem Relation Age of Onset  . Hypertension Mother   . Cancer Mother        breast,anal  . Osteoporosis Mother   . Breast cancer Mother 26  . Rectal cancer Mother   . CAD Sister   . Arrhythmia Sister   . Hypothyroidism Sister   . Arrhythmia Brother   . Colon polyps Neg Hx   . Esophageal cancer Neg Hx   . Stomach cancer Neg Hx    Allergies  Allergen Reactions  . Amoxicillin Hives    Did it involve swelling of the face/tongue/throat, SOB, or low BP? No Did  it involve sudden or severe rash/hives, skin peeling, or any reaction on the inside of your mouth or nose? Yes Did you need to seek medical attention at a hospital or doctor's office? No When did it last happen?>51yr ago  If all above answers are "NO", may proceed with cephalosporin use.   Outpatient Encounter Medications as of 03/29/2020  Medication Sig  . aspirin 81 MG tablet Take 81 mg by mouth daily.  .Marland Kitchenatorvastatin (LIPITOR) 10 MG tablet Take 1 tablet (10 mg total) by mouth daily.  . Calcium Citrate-Vitamin D 250-200 MG-UNIT TABS Take 2 tablets by mouth at bedtime.  . Coenzyme Q10 (CO Q-10 PO) Take 1 capsule by mouth every morning.  . conjugated estrogens (PREMARIN) vaginal cream Place 1.5 g vaginally once a week. Take one tablet by mouth on Sundays per patient  . ezetimibe (ZETIA) 10 MG tablet TAKE 1/2 TABLET BY MOUTH EVERY DAY  . famotidine (PEPCID) 20 MG tablet Take one tablet after dinner  . GLUCOSAMINE CHONDROITIN COMPLX PO Take 2 tablets by mouth daily.   . meloxicam (MOBIC) 7.5 MG tablet 1-2 tabs daily as needed for pain  . metoprolol succinate (TOPROL XL) 25 MG 24 hr tablet Take 0.5 (12.5 mg)_to 1 tablet (25 mg) daily  . Multiple Vitamins-Minerals (ALIVE WOMENS 50+ PO) Take 1 tablet by mouth every morning.  .Marland KitchenOVER THE COUNTER MEDICATION Take 1 tablet by mouth daily. Tumeric and cumin  .  pantoprazole (PROTONIX) 40 MG tablet TAKE 1 TABLET BY MOUTH 2 TIMES DAILY BEFORE A MEAL  . raloxifene (EVISTA) 60 MG tablet TAKE 1 TABLET BY MOUTH EVERY DAY  . sertraline (ZOLOFT) 25 MG tablet Take 1 tablet (25 mg total) by mouth daily. (Patient not taking: Reported on 02/27/2020)  . timolol (TIMOPTIC) 0.5 % ophthalmic solution 1 drop 2 (two) times daily.  . TRAVATAN Z 0.004 % SOLN ophthalmic solution Place 1 drop into both eyes at bedtime.    No facility-administered encounter medications on file as of 03/29/2020.   Patient Care Team    Relationship Specialty Notifications Start End   [name], MD PCP - General   02/06/10   Fay Records, MD PCP - Cardiology Cardiology Admissions 03/21/19   Linda Hedges, DO Consulting Physician Obstetrics and Gynecology  04/18/14   Marygrace Drought, MD Consulting Physician Ophthalmology  08/02/15   Milus Banister, MD Attending Physician Gastroenterology  02/09/19   Madelin Rear, [facility]  Pharmacist  05/24/19    Comment: phone number [phone]   Current Diagnosis/Assessment: Goals Addressed            This Visit's Progress   . PharmD Care Plan   On track    CARE PLAN ENTRY  Current Barriers:  . Chronic Disease Management support, education, and care coordination needs related to CAD and HLD  Pharmacist Clinical Goal(s):  [name] LDL <70 . Receive Tdap vaccine within 90 days  GERD . Pharmacist Clinical Goal(s) o Over the next 180 days, patient will work with PharmD and providers to minimize symptoms of acid reflux . Current regimen:  o Pantoprazole 40 mg twice daily . Interventions: o Reviewed reflux triggers  . Patient self care activities - Over the next 180 days, patient will: o Minimize exposure to triggers, contact pharmacist with any questions Interventions: . Comprehensive medication review performed.  Patient Self Care Activities:  . Patient verbalizes understanding of plan to get Tdap at local pharmacy Initial goal documentation.       Hyperlipidemia   LDL goal < 70  Lipid Panel     Component Value Date/Time   CHOL 138 02/10/2020 0814   CHOL 110 04/10/2016 1143   CHOL 96 (L) 12/21/2014 0824   TRIG 84.0 02/10/2020 0814   TRIG 66 12/21/2014 0824   HDL 55.30 02/10/2020 0814   HDL 54 04/10/2016 1143   HDL 45 (L) 12/21/2014 0824   LDLCALC 66 02/10/2020 0814   LDLCALC 45 04/10/2016 1143   LDLCALC 38 12/21/2014 0824    Hepatic Function Latest Ref Rng & Units 02/10/2020 08/16/2019 02/23/2019  Total Protein 6.0 - 8.3 g/dL 7.0 6.5 6.2  Albumin 3.5 - 5.2 g/dL 4.4 4.4 4.1  AST 0 - 37 U/L _0 ALT  0 - 35 U/L _1 Alk Phosphatase 39 - 117 U/L 57 58 48  Total Bilirubin 0.2 - 1.2 mg/dL 0.6 0.6 0.5  Bilirubin, Direct 0.0 - 0.3 mg/dL 0.2 0.1 0.1    The 10-year ASCVD risk score Mikey Bussing DC Jr., et al., 2013) is: 18.2%   Values used to calculate the score:     Age: 34 years     Sex: Female     Is Non-Hispanic African American: No     Diabetic: No     Tobacco smoker: No     Systolic Blood Pressure: 789 mmHg     Is BP treated: Yes     HDL Cholesterol: 55.3 mg/dL  Total Cholesterol: 138 mg/dL   Patient has failed these meds in past: n/a. Has been making efforts to walk more consistently.  Patient is currently at goal  on the following medications:  . Atorvastatin 10 mg once daily . Ezetimibe 10 mg once daily  CAD. Denies any abnormal bruising, bleeding from nose or gums or blood in urine or stool. Patient is currently controlled on the following medications:  . Aspirin 81 mg once daily   Plan  Continue current medications and control with diet and exercise.  Supraventricular tachyvardia   Pulse Readings from Last 3 Encounters:  02/09/20 71  01/27/20 83  09/12/19 76   BP Readings from Last 3 Encounters:  02/09/20 118/80  01/27/20 134/68  09/12/19 120/60  Recent home readings: 852D systolic BP, 78E-42P pulse.  Denies symptoms or side effects, has been taking lower doses of metoprolol and feels mindfulness/stress management has helped to slow heart rate. Patient is currently controlled on the following medications:  [name] Metoprolol succinate 25 mg tablet - take half (12.5 mg) to whole tablet (25 mg) daily   We discussed home monitoring recommendations.   Plan  Continue current medications    GERD   Reflux has been well controlled after taking pantoprazole twice daily as discussed at last OV. Eating smaller meal portions, trying to eat less at night, avoiding soups. Currently taking: . Pantoprazole 40 mg twice daily  We reviewed: Avoidance of potential triggers such  as alcohol, fatty foods and tomato sauce.  Plan   Take pantoprazole 40 mg twice daily as prescribed.  Osteopenia    Last DEXA Scan: 02/2019. Osteopenia.  VITD  Date Value Ref Range Status  08/16/2019 67.97 30.00 - 100.00 ng/mL Final    Walking daily. Patient is currently controlled on the following medications:  . Calcium / vitamin d3 supplementation . Evista 60 mg once daily   We discussed:  Recommend [phone] units of vitamin D daily. Recommend weight-bearing and muscle strengthening exercises for building and maintaining bone density.  Plan  Continue   Depression/Anxiety   PHQ9 SCORE ONLY 03/29/2020 02/27/2020 02/09/2020  PHQ-9 Total Score 0 0 6   Previous medications: Zoloft to be initiated 01/2020. Decided to work through personal/family without the help of medications and feels this has worked well for her, not wanting to take medication at this time. Patient is currently controlled on the following medications:  . n/a  Plan  Continue current management.  Vaccines   Immunization History  Administered Date(s) Administered  . Fluad Quad(high Dose 65+) 11/04/2019  . Influenza Split 12/16/2011  . Influenza Whole 12/05/2009, 11/04/2010  . Influenza, High Dose Seasonal PF 11/30/2012, 10/30/2018  . Influenza,inj,Quad PF,6+ Mos 11/15/2013, 11/28/2014, 10/15/2015, 12/16/2016, 12/03/2017  . Influenza-Unspecified 10/25/2012, 10/09/2018  . PFIZER(Purple Top)SARS-COV-2 Vaccination 04/01/2019, 04/26/2019, 11/22/2019  . Pneumococcal Conjugate-13 01/11/2014  . Pneumococcal Polysaccharide-23 09/05/2011  . Pneumococcal-Unspecified 12/08/2017  . Tdap 02/09/2007  . Zoster 12/08/2017  . Zoster Recombinat (Shingrix) 08/22/2017, 10/30/2017   Reviewed patient's vaccination history. Still needing tdap, otherwise up to date.   Plan  Recommend patient receive Tdap vaccine.   Medication Management / Coordination   Receives prescription medications from:  [place], [place] - [address] [place] [address] Phone: [phone] Fax: [phone]   Denies any issues with current medication management.   Plan  Continue current medication management strategy. ___________________________ SDOH (Social Determinants of Health) assessments performed: Yes.  Future Appointments  Date Time Provider Department  Center  09/27/2020 10:30 AM [facility] CCM PHARMACIST [facility]  03/04/2021  2:15 PM [facility] HEALTH COACH [facility]   Visit follow-up:  . [facility] follow-up: 3 mont gen/adh. . [place] follow-up: 6 month telephone visit.  Madelin Rear, Pharm.D., BCGP Clinical Pharmacist [facility] [phone]

## 2020-03-30 NOTE — Patient Instructions (Signed)
Ms. Fritsch,  Thank you for taking the time to review your medications with me today.  I have included our care plan/goals in the following pages. Please review and call me at [phone] with any questions!  Thanks! Ellin Mayhew, Pharm.D., BCGP Clinical Pharmacist [facility] at [facility] [phone]  Goals Addressed            This Visit's Progress   . PharmD Care Plan   On track    CARE PLAN ENTRY  Current Barriers:  . Chronic Disease Management support, education, and care coordination needs related to CAD and HLD  Pharmacist Clinical Goal(s):  [name] LDL <70 . Receive Tdap vaccine within 90 days  GERD . Pharmacist Clinical Goal(s) o Over the next 180 days, patient will work with PharmD and providers to minimize symptoms of acid reflux . Current regimen:  o Pantoprazole 40 mg twice daily . Interventions: o Reviewed reflux triggers  . Patient self care activities - Over the next 180 days, patient will: o Minimize exposure to triggers, contact pharmacist with any questions Interventions: . Comprehensive medication review performed.  Patient Self Care Activities:  . Patient verbalizes understanding of plan to get Tdap at local pharmacy Initial goal documentation.       The patient verbalized understanding of instructions provided today and agreed to receive a MyChart copy of patient instruction and/or educational materials. Telephone follow up appointment with pharmacy team member scheduled for: See next appointment with "Care Management Staff" under "What's Next" below.

## 2020-05-03 ENCOUNTER — Other Ambulatory Visit: Payer: Self-pay

## 2020-05-04 ENCOUNTER — Ambulatory Visit
Admission: RE | Admit: 2020-05-04 | Discharge: 2020-05-04 | Disposition: A | Discharge status: Home / Self Care | Payer: Medicare Other | Source: Ambulatory Visit | Attending: Family Medicine | Admitting: Family Medicine

## 2020-05-04 ENCOUNTER — Encounter: Payer: Self-pay | Admitting: Family Medicine

## 2020-05-04 ENCOUNTER — Telehealth: Payer: Self-pay | Admitting: Family Medicine

## 2020-05-04 ENCOUNTER — Ambulatory Visit (INDEPENDENT_AMBULATORY_CARE_PROVIDER_SITE_OTHER): Payer: Medicare Other | Admitting: Family Medicine

## 2020-05-04 VITALS — BP 120/70 | HR 62 | Temp 96.4°F | Resp 16 | Ht 61.0 in | Wt 157.0 lb

## 2020-05-04 DIAGNOSIS — M79662 Pain in left lower leg: Secondary | ICD-10-CM

## 2020-05-04 DIAGNOSIS — M25562 Pain in left knee: Secondary | ICD-10-CM | POA: Diagnosis not present

## 2020-05-04 MED ORDER — MELOXICAM 7.5 MG PO TABS: ORAL_TABLET | ORAL | 3 refills | Status: DC

## 2020-05-04 NOTE — Telephone Encounter (Signed)
FYI - [facility] got her worked in today so you do not need to make her any appointments

## 2020-05-04 NOTE — Progress Notes (Signed)
   Subjective:    Patient ID: Denise Ewing, female    DOB: [birth year], 75 y.o.   MRN: [id number]  HPI L knee pain- pt was crawling on the floor with the puppy over the weekend when she developed shooting pain down L leg.  Has 1 particular place along superior lateral knee that is TTP.  If she sits for any period of time, leg gets very stiff and she has pain and difficulty getting up.  When sitting will have pain behind her knee and down into her calf.  No redness.  Leg feels 'tight, thick'.  Taking OTC anti-inflammatories w/ temporary and mild improvement.  Some numbness this AM of posterior calf- 'it feels like a dead piece of wood'.  Sister died of 'blood clot'.   Review of Systems For ROS see HPI   This visit occurred during the SARS-CoV-2 public health emergency.  Safety protocols were in place, including screening questions prior to the visit, additional usage of staff PPE, and extensive cleaning of exam room while observing appropriate contact time as indicated for disinfecting solutions.       Objective:   Physical Exam Vitals reviewed.  Constitutional:      General: She is not in acute distress.    Appearance: Normal appearance. She is not ill-appearing.  HENT:     Head: Normocephalic and atraumatic.  Cardiovascular:     Pulses: Normal pulses.  Musculoskeletal:        General: Tenderness (TTP over L lateral femoral condyle) present. No deformity.     Right lower leg: No edema.     Left lower leg: No edema.     Comments: Minimal TTP over L calf, no palpable cord, no redness or warmth  Neurological:     General: No focal deficit present.     Mental Status: She is alert and oriented to person, place, and time.     Cranial Nerves: No cranial nerve deficit.     Motor: Weakness (stiff, antalgic gait upon first standing.  improves after a few steps) present.     Coordination: Coordination normal.           Assessment & Plan:  L lateral knee pain- new.  Suspect a strain or  nerve irritation from kneeling/crawling on the floor.  No bony abnormality and pain is very localized.  Increase Meloxicam to 15mg  daily x7-10 days.  Ice.  Pt expressed understanding and is in agreement w/ plan.   Calf pain- new.  Difficult to reproduce on PE today.  Pt is anxious about possibility of clot b/c sister died from a blood clot.  No redness, warmth, palpable cord.  No period of recent immobility.  Get [place] to assess.  Pt expressed understanding and is in agreement w/ plan.

## 2020-05-04 NOTE — Telephone Encounter (Signed)
FYI

## 2020-05-04 NOTE — Telephone Encounter (Signed)
[facility] called and said that the patients results are in Epic now if you would like to review them.

## 2020-05-04 NOTE — Patient Instructions (Addendum)
We are trying to get your [place] scheduled for later today.  We will call you once we have that appt DOUBLE your Meloxicam to 15 mg (2 tabs) for the next 7-10 days ICE! Call with any questions or concerns Hang in there!

## 2020-05-10 ENCOUNTER — Other Ambulatory Visit: Payer: Self-pay | Admitting: Family Medicine

## 2020-05-18 DIAGNOSIS — M25562 Pain in left knee: Secondary | ICD-10-CM | POA: Diagnosis not present

## 2020-05-18 DIAGNOSIS — S83282A Other tear of lateral meniscus, current injury, left knee, initial encounter: Secondary | ICD-10-CM | POA: Diagnosis not present

## 2020-05-25 DIAGNOSIS — M1611 Unilateral primary osteoarthritis, right hip: Secondary | ICD-10-CM | POA: Diagnosis not present

## 2020-05-25 DIAGNOSIS — M25551 Pain in right hip: Secondary | ICD-10-CM | POA: Diagnosis not present

## 2020-05-28 DIAGNOSIS — M25551 Pain in right hip: Secondary | ICD-10-CM | POA: Diagnosis not present

## 2020-05-31 ENCOUNTER — Telehealth: Payer: Medicare Other

## 2020-06-04 ENCOUNTER — Other Ambulatory Visit: Payer: Self-pay | Admitting: Family Medicine

## 2020-06-07 ENCOUNTER — Other Ambulatory Visit: Payer: Self-pay | Admitting: Family Medicine

## 2020-06-18 ENCOUNTER — Ambulatory Visit (INDEPENDENT_AMBULATORY_CARE_PROVIDER_SITE_OTHER): Payer: Medicare Other | Admitting: Registered Nurse

## 2020-06-18 ENCOUNTER — Encounter: Payer: Self-pay | Admitting: Registered Nurse

## 2020-06-18 ENCOUNTER — Other Ambulatory Visit: Payer: Self-pay

## 2020-06-18 VITALS — Temp 97.8°F | Wt 154.0 lb

## 2020-06-18 DIAGNOSIS — B9689 Other specified bacterial agents as the cause of diseases classified elsewhere: Secondary | ICD-10-CM | POA: Diagnosis not present

## 2020-06-18 DIAGNOSIS — R059 Cough, unspecified: Secondary | ICD-10-CM

## 2020-06-18 DIAGNOSIS — J019 Acute sinusitis, unspecified: Secondary | ICD-10-CM

## 2020-06-18 MED ORDER — BENZONATATE 100 MG PO CAPS: 100.0000 mg | ORAL_CAPSULE | Freq: Two times a day (BID) | ORAL | 0 refills | Status: DC | PRN

## 2020-06-18 MED ORDER — AZITHROMYCIN 250 MG PO TABS: ORAL_TABLET | ORAL | 0 refills | Status: AC

## 2020-06-18 NOTE — Patient Instructions (Signed)
° ° ° °  If you have lab work done today you will be contacted with your lab results within the next 2 weeks.  If you have not heard from us then please contact us. The fastest way to get your results is to register for My Chart. ° ° °IF you received an x-ray today, you will receive an invoice from [facility]. Please contact [facility] at [phone] with questions or concerns regarding your invoice.  ° °IF you received labwork today, you will receive an invoice from [facility]. Please contact [facility] at [phone] with questions or concerns regarding your invoice.  ° °Our billing staff will not be able to assist you with questions regarding bills from these companies. ° °You will be contacted with the lab results as soon as they are available. The fastest way to get your results is to activate your My Chart account. Instructions are located on the last page of this paperwork. If you have not heard from us regarding the results in 2 weeks, please contact this office. °  ° ° ° °

## 2020-06-18 NOTE — Progress Notes (Addendum)
Telemedicine Encounter- SOAP NOTE Established Patient  This telephone encounter was conducted with the patient's (or proxy's) verbal consent via audio telecommunications: yes  Patient was instructed to have this encounter in a suitably private space; and to only have persons present to whom they give permission to participate. In addition, patient identity was confirmed by use of name plus two identifiers (DOB and address).  I discussed the limitations, risks, security and privacy concerns of performing an evaluation and management service by telephone and the availability of in person appointments. I also discussed with the patient that there may be a patient responsible charge related to this service. The patient expressed understanding and agreed to proceed.  I spent a total of 15 minutes talking with the patient or their proxy.  Patient at home Provider in office Persons participating in visit: Provider Kathrin Ruddy NP and patient Denise Ewing  Chief Complaint  Patient presents with  . Cough    Patient states she has been experiencing a head cold last Monday. Patient states on Thursday she noticed some coughing and the congestion then moved to her chest. She took a covid test on Friday it was negative then she is starting to cough up blood so she is wondering whats her next steps.    Subjective   Denise Ewing is a 75 y.o. established patient. Telephone visit today for cough  HPI Onset last Monday, worsening URI symptoms developing into cough and chest congestion Has happened before but has been some time Cough is worse at night due to PND Has used mucinex and tylenol PM with some effect Does take an OTC allergy med daily during allergy season, usually doesn't have substantial allergy symptoms Negative COVID test on Friday On Saturday was coughing very hard, some bright red blood streaked in sputum. Has not recurred. Has happened in the past with URI No bowel changes, no nvd No  lightheadedness, dizziness, headaches, or other neuro changes.  Otherwise no concerns.  Patient Active Problem List   Diagnosis Date Noted  . Adjustment disorder with depressed mood 02/09/2020  . Irregular heart beat 01/27/2020  . Overweight (BMI 25.0-29.9) 02/09/2018  . Numbness and tingling 08/05/2016  . Osteoporosis 04/18/2014  . Hyperlipidemia 04/18/2014  . CAD (coronary artery disease), native coronary artery 01/11/2014  . Chest pain 01/05/2014  . Pain in the chest   . Hand arthritis 03/23/2013  . Eczema 02/08/2013  . Routine general medical examination at a health care facility 02/06/2012  . Lumbar radicular pain 09/05/2011  . Fatigue 12/23/2010  . Neuropathy of foot 11/04/2010  . GERD 12/05/2009  . DYSPAREUNIA 12/05/2009  . SKIN CANCER, HX OF 12/05/2009  . COLONIC POLYPS, HX OF 12/05/2009    Past Medical History:  Diagnosis Date  . Allergy    post nasal drip  . Arthritis   . Blood transfusion without reported diagnosis    with hysterectomy 11 years ago  . GERD (gastroesophageal reflux disease)   . Glaucoma    bilateral eyes  . History of colonic polyps   . Melanoma of thigh ([place])   . Osteoporosis     Current Outpatient Medications  Medication Sig Dispense Refill  . aspirin 81 MG tablet Take 81 mg by mouth daily.    [name] atorvastatin (LIPITOR) 10 MG tablet Take 1 tablet (10 mg total) by mouth daily. 90 tablet 2  . azithromycin (ZITHROMAX) 250 MG tablet Take 2 tablets on day 1, then 1 tablet daily on days 2 through 5  6 tablet 0  . benzonatate (TESSALON) 100 MG capsule Take 1 capsule (100 mg total) by mouth 2 (two) times daily as needed for cough. 20 capsule 0  . Calcium Citrate-Vitamin D 250-200 MG-UNIT TABS Take 2 tablets by mouth at bedtime.    . Coenzyme Q10 (CO Q-10 PO) Take 1 capsule by mouth every morning.    . conjugated estrogens (PREMARIN) vaginal cream Place 1.5 g vaginally once a week. Take one tablet by mouth on Sundays per patient    . ezetimibe  (ZETIA) 10 MG tablet TAKE 1/2 TABLET BY MOUTH EVERY DAY 45 tablet 1  . famotidine (PEPCID) 20 MG tablet Take one tablet after dinner 30 tablet 3  . GLUCOSAMINE CHONDROITIN COMPLX PO Take 2 tablets by mouth daily.     . meloxicam (MOBIC) 7.5 MG tablet 1-2 tabs daily as needed for pain 60 tablet 3  . metoprolol succinate (TOPROL XL) 25 MG 24 hr tablet Take 0.5 (12.5 mg)_to 1 tablet (25 mg) daily 90 tablet 3  . Multiple Vitamins-Minerals (ALIVE WOMENS 50+ PO) Take 1 tablet by mouth every morning.    [name] OVER THE COUNTER MEDICATION Take 1 tablet by mouth daily. Tumeric and cumin    . pantoprazole (PROTONIX) 40 MG tablet TAKE 1 TABLET BY MOUTH 2 TIMES DAILY BEFORE A MEAL 180 tablet 3  . raloxifene (EVISTA) 60 MG tablet TAKE 1 TABLET BY MOUTH EVERY DAY 90 tablet 0  . sertraline (ZOLOFT) 25 MG tablet TAKE 1 TABLET BY MOUTH EVERY DAY 30 tablet 2  . timolol (TIMOPTIC) 0.5 % ophthalmic solution 1 drop 2 (two) times daily.    . TRAVATAN Z 0.004 % SOLN ophthalmic solution Place 1 drop into both eyes at bedtime.   1   No current facility-administered medications for this visit.    Allergies  Allergen Reactions  . Amoxicillin Hives    Did it involve swelling of the face/tongue/throat, SOB, or low BP? No Did it involve sudden or severe rash/hives, skin peeling, or any reaction on the inside of your mouth or nose? Yes Did you need to seek medical attention at a hospital or doctor's office? No When did it last happen?>70yrs ago  If all above answers are "NO", may proceed with cephalosporin use.    Social History   Socioeconomic History  . Marital status: Married    Spouse name: Not on file  . Number of children: Not on file  . Years of education: Not on file  . Highest education level: Not on file  Occupational History  . Not on file  Tobacco Use  . Smoking status: Never Smoker  . Smokeless tobacco: Never Used  Vaping Use  . Vaping Use: Never used  Substance and Sexual Activity  .  Alcohol use: Yes    Comment:  occasionally  . Drug use: No  . Sexual activity: Not on file  Other Topics Concern  . Not on file  Social History Narrative  . Not on file   Social Determinants of Health   Financial Resource Strain: Low Risk   . Difficulty of Paying Living Expenses: Not hard at all  Food Insecurity: No Food Insecurity  . Worried About Charity fundraiser in the Last Year: Never true  . Ran Out of Food in the Last Year: Never true  Transportation Needs: No Transportation Needs  . Lack of Transportation (Medical): No  . Lack of Transportation (Non-Medical): No  Physical Activity: Insufficiently Active  . Days of Exercise  per Week: 5 days  . Minutes of Exercise per Session: 20 min  Stress: No Stress Concern Present  . Feeling of Stress : Only a little  Social Connections: Moderately Integrated  . Frequency of Communication with Friends and Family: More than three times a week  . Frequency of Social Gatherings with Friends and Family: More than three times a week  . Attends Religious Services: Never  . Active Member of Clubs or Organizations: Yes  . Attends Archivist Meetings: More than 4 times per year  . Marital Status: Married  Human resources officer Violence: Not At Risk  . Fear of Current or Ex-Partner: No  . Emotionally Abused: No  . Physically Abused: No  . Sexually Abused: No    ROS Per hpi   Objective   Vitals as reported by the patient: Today's Vitals   06/18/20 1113  Temp: 97.8 F (36.6 C)  Weight: 154 lb (69.9 kg)    Hanley was seen today for cough.  Diagnoses and all orders for this visit:  Cough -     benzonatate (TESSALON) 100 MG capsule; Take 1 capsule (100 mg total) by mouth 2 (two) times daily as needed for cough.  Acute bacterial sinusitis -     azithromycin (ZITHROMAX) 250 MG tablet; Take 2 tablets on day 1, then 1 tablet daily on days 2 through 5 -     benzonatate (TESSALON) 100 MG capsule; Take 1 capsule (100 mg total)  by mouth 2 (two) times daily as needed for cough.   PLAN  Given duration and worsening symptoms, suspect bacterial respiratory infection. Will give z pack given her amoxicillin intolerance  Would give albuterol or prednisone course but pt has not tolerated these well and is taking meloxicam for knee pain already - does not want to combine with prednisone  Tessalon for symptom relief. Ok to use mucinex and tylenol PM  Return by end of week if worsening or failing to improve  Patient encouraged to call clinic with any questions, comments, or concerns.  I discussed the assessment and treatment plan with the patient. The patient was provided an opportunity to ask questions and all were answered. The patient agreed with the plan and demonstrated an understanding of the instructions.   The patient was advised to call back or seek an in-person evaluation if the symptoms worsen or if the condition fails to improve as anticipated.  I provided 15 minutes of non-face-to-face time during this encounter.  [name], NP  Primary [facility] at [facility]

## 2020-06-19 ENCOUNTER — Encounter: Payer: Self-pay | Admitting: Registered Nurse

## 2020-06-20 ENCOUNTER — Other Ambulatory Visit: Payer: Self-pay | Admitting: Registered Nurse

## 2020-06-20 DIAGNOSIS — R059 Cough, unspecified: Secondary | ICD-10-CM

## 2020-06-20 MED ORDER — GUAIFENESIN-CODEINE 100-10 MG/5ML PO SYRP: 5.0000 mL | ORAL_SOLUTION | Freq: Every evening | ORAL | 0 refills | Status: DC | PRN

## 2020-06-27 DIAGNOSIS — H25013 Cortical age-related cataract, bilateral: Secondary | ICD-10-CM | POA: Diagnosis not present

## 2020-06-27 DIAGNOSIS — H2513 Age-related nuclear cataract, bilateral: Secondary | ICD-10-CM | POA: Diagnosis not present

## 2020-06-27 DIAGNOSIS — H401123 Primary open-angle glaucoma, left eye, severe stage: Secondary | ICD-10-CM | POA: Diagnosis not present

## 2020-06-27 DIAGNOSIS — H401111 Primary open-angle glaucoma, right eye, mild stage: Secondary | ICD-10-CM | POA: Diagnosis not present

## 2020-07-04 ENCOUNTER — Other Ambulatory Visit: Payer: Self-pay | Admitting: Gastroenterology

## 2020-08-10 ENCOUNTER — Other Ambulatory Visit: Payer: Self-pay | Admitting: Family Medicine

## 2020-08-22 ENCOUNTER — Encounter: Payer: Self-pay | Admitting: *Deleted

## 2020-09-13 ENCOUNTER — Telehealth: Payer: Self-pay

## 2020-09-13 NOTE — Chronic Care Management (AMB) (Signed)
Chronic Care Management Pharmacy Assistant   Name: Denise Ewing  MRN: [id number] DOB: Aug 17, 1945   Reason for Encounter: General Adherence Disease State Call    Recent office visits:  06/18/2020 Denise Gitelman, NP; Start benzonatate 100 mg BID prn and Azithromycin 250 mg as directed for cough.  05/04/2020 [facility] PCP Denise Minium, MD; left lateral knee pain, increase Meloxicam to 15 mg daily x 7-10 days.  Recent consult visits:  05/28/2020 [facility], Denise Jaeger, MD; no medication changes indicated.  05/25/2020 [facility], Denise Sprung, MD; no further information available.  05/18/2020 [facility], Denise Sprung, MD; no further information available.  Hospital visits:  None in previous 6 months  Medications: Outpatient Encounter Medications as of 09/13/2020  Medication Sig   aspirin 81 MG tablet Take 81 mg by mouth daily.   atorvastatin (LIPITOR) 10 MG tablet Take 1 tablet (10 mg total) by mouth daily.   benzonatate (TESSALON) 100 MG capsule Take 1 capsule (100 mg total) by mouth 2 (two) times daily as needed for cough.   Calcium Citrate-Vitamin D 250-200 MG-UNIT TABS Take 2 tablets by mouth at bedtime.   Coenzyme Q10 (CO Q-10 PO) Take 1 capsule by mouth every morning.   conjugated estrogens (PREMARIN) vaginal cream Place 1.5 g vaginally once a week. Take one tablet by mouth on Sundays per patient   ezetimibe (ZETIA) 10 MG tablet TAKE 1/2 TABLET BY MOUTH EVERY DAY   famotidine (PEPCID) 20 MG tablet Take one tablet after dinner   GLUCOSAMINE CHONDROITIN COMPLX PO Take 2 tablets by mouth daily.    guaiFENesin-codeine (ROBITUSSIN AC) 100-10 MG/5ML syrup Take 5 mLs by mouth at bedtime as needed for cough.   meloxicam (MOBIC) 7.5 MG tablet 1-2 tabs daily as needed for pain   metoprolol succinate (TOPROL XL) 25 MG 24 hr tablet Take 0.5 (12.5 mg)_to 1 tablet (25 mg) daily   Multiple Vitamins-Minerals (ALIVE WOMENS 50+ PO) Take 1 tablet by mouth every  morning.   OVER THE COUNTER MEDICATION Take 1 tablet by mouth daily. Tumeric and cumin   pantoprazole (PROTONIX) 40 MG tablet TAKE 1 TABLET BY MOUTH 2 TIMES DAILY BEFORE A MEAL   raloxifene (EVISTA) 60 MG tablet TAKE 1 TABLET BY MOUTH EVERY DAY   sertraline (ZOLOFT) 25 MG tablet TAKE 1 TABLET BY MOUTH EVERY DAY   timolol (TIMOPTIC) 0.5 % ophthalmic solution 1 drop 2 (two) times daily.   TRAVATAN Z 0.004 % SOLN ophthalmic solution Place 1 drop into both eyes at bedtime.    No facility-administered encounter medications on file as of 09/13/2020.    Have you had any problems recently with your health? Patient states she has had some aches and pains recently. She states her right hip is improving as she has been getting steroid injections. She states her knee pain is currently stable.  Have you had any problems with your pharmacy? Patient states she has not had any problems recently with her pharmacy.  What issues or side effects are you having with your medications? Patient states she is not currently having any issues or side effects from any of her medications.  What would you like me to pass along to Madelin Rear, CPP for him to help you with?  Patient states she does not have anything for me to pass along at this time.  What can we do to take care of you better? Patient did not have any suggestions.  Future Appointments  Date  Time Provider [place]  09/27/2020 10:30 AM [facility] PHARMACIST [facility]  03/04/2021  2:15 PM [facility] HEALTH COACH [facility]     Star Rating Drugs: Atorvastatin 10 mg last filled 08/30/2020 90 DS  April D Calhoun, [place] Pharmacist Assistant [phone]

## 2020-09-17 DIAGNOSIS — M1611 Unilateral primary osteoarthritis, right hip: Secondary | ICD-10-CM | POA: Diagnosis not present

## 2020-09-19 ENCOUNTER — Telehealth: Payer: Self-pay

## 2020-09-19 NOTE — Chronic Care Management (AMB) (Signed)
    Chronic Care Management Pharmacy Assistant   Name: Denise Ewing  MRN: [id number] DOB: Dec 04, 1945  Reason for Encounter: Reschedule appointment with clinical pharmacist.   Patient rescheduled her appointment with clinical pharmacist to 10/03/2020 at 2:00 pm.  Future Appointments  Date Time Provider [place]  10/03/2020  2:00 PM [facility] PHARMACIST [facility]  03/04/2021  2:15 PM [facility] HEALTH COACH [facility]    General Adherence Call completed 09/13/2020. Patient states she does not have any health concerns at this time.  April D Calhoun, [place] Pharmacist Assistant [phone]

## 2020-09-27 ENCOUNTER — Telehealth: Payer: Medicare Other

## 2020-10-03 ENCOUNTER — Ambulatory Visit (INDEPENDENT_AMBULATORY_CARE_PROVIDER_SITE_OTHER): Payer: Medicare Other

## 2020-10-03 DIAGNOSIS — M818 Other osteoporosis without current pathological fracture: Secondary | ICD-10-CM

## 2020-10-03 DIAGNOSIS — E785 Hyperlipidemia, unspecified: Secondary | ICD-10-CM

## 2020-10-03 NOTE — Patient Instructions (Signed)
Ms. Vanacker,  Thank you for talking with me today. I have included our care plan/goals in the following pages.   Please review and call me at [phone] with any questions.  Thanks! Ellin Mayhew, PharmD, CPP Clinical Pharmacist Practitioner  [phone]  Patient Care Plan: ccm pharmacy care plan     Problem Identified: Osteoporosis, CAD, HLD, Anxiety, Depression, and GERD   Priority: High     Long-Range Goal: Disease management   Start Date: 10/03/2020  Expected End Date: 10/03/2021  This Visit's Progress: On track  Priority: High  Note:    Pharmacist Clinical Goal(s):  Patient will achieve adherence to monitoring guidelines and medication adherence to achieve therapeutic efficacy contact provider office for questions/concerns as evidenced notation of same in electronic health record through collaboration with PharmD and provider.   Interventions: 1:1 collaboration with [name], MD regarding development and update of comprehensive plan of care as evidenced by provider attestation and co-signature Inter-disciplinary care team collaboration (see longitudinal plan of care) Comprehensive medication review performed; medication list updated in electronic medical record  Hyperlipidemia: (LDL goal < 70) -Controlled - needing f/u labs -Previous blockage noted on CT, high risk CAC -Current treatment: Zetia 10 mg tablet - half tablet once daily (06/04/20, 08/30/20)  Atorvastatin 10 mg once daily (06/04/20, 08/30/20) - -Also on ASA 81 mg -Reviewed tolerability/side effects - no current issues -Reviewed Fill hx - no gaps noted -Educated on Cholesterol goals;  -Recommended to continue current medication  Osteopenia (Goal ensure appropriate supplementation/tx) -Controlled -Last DEXA Scan: 04/2019 - osteopenia  -Patient is not a candidate for pharmacologic treatment -Current treatment  Vitamin D3-Calcium supplementation Evisata 60 mg once daily  -Recommend  weight-bearing and muscle strengthening exercises for building and maintaining bone density. -Recommended to continue current medication  Osteoarthritis (Goal: optimize medication safety -Controlled -Receives steroid injections. Uses meloxicam every day regardless of pain.  -Current treatment  Meloxicam 7.5 mg twice daily  -Reviewed potential risks and agreeable to transition to as needed use with tylenol as first line.    Patient Goals/Self-Care Activities Patient will:  - target a minimum of 150 minutes of moderate intensity exercise weekly  Medication Assistance: None required.  Patient affirms current coverage meets needs.  Patient's preferred pharmacy is:  [facility] - [place], [place] - [address] [place] [address] Phone: [phone] Fax: [phone]  Follow Up:  Patient agrees to Care Plan and Follow-up.  Follow Up Plan: [place] f/u call 6 months, CPA gen/adh 3 months. Scheduled for physical with pcp 11/2020     The patient verbalized understanding of instructions provided today and agreed to receive a MyChart copy of patient instruction and/or educational materials. Telephone follow up appointment with pharmacy team member scheduled for: See next appointment with "Care Management Staff" under "What's Next" below.

## 2020-10-03 NOTE — Progress Notes (Signed)
Chronic Care Management Pharmacy Note  10/03/2020 Name:  Denise Ewing MRN:  [id number] DOB:  07/09/1945  Summary: Never started SSRI - feels depressive symptoms have completely gone away, taken off med list  Plan:  Subjective: Denise Ewing is an 75 y.o. year old female who is a primary patient of Tabori, Aundra Millet, MD.  The CCM team was consulted for assistance with disease management and care coordination needs.    Engaged with patient by telephone for follow up visit in response to provider referral for pharmacy case management and/or care coordination services.   Consent to Services:  The patient was given information about Chronic Care Management services, agreed to services, and gave verbal consent prior to initiation of services.  Please see initial visit note for detailed documentation.   Patient Care Team: [name], MD as PCP - Desma Paganini, MD as PCP - Cardiology (Cardiology) Linda Hedges, DO as Consulting Physician (Obstetrics and Gynecology) Marygrace Drought, MD as Consulting Physician (Ophthalmology) Milus Banister, MD as Attending Physician (Gastroenterology) Madelin Rear, [facility] (Pharmacist)  Objective:  Lab Results  Component Value Date   CREATININE 0.87 02/10/2020   CREATININE 0.81 08/16/2019   CREATININE 0.98 02/23/2019    No results found for: HGBA1C Last diabetic Eye exam: No results found for: HMDIABEYEEXA  Last diabetic Foot exam: No results found for: HMDIABFOOTEX      Component Value Date/Time   CHOL 138 02/10/2020 0814   CHOL 110 04/10/2016 1143   CHOL 96 (L) 12/21/2014 0824   TRIG 84.0 02/10/2020 0814   TRIG 66 12/21/2014 0824   HDL 55.30 02/10/2020 0814   HDL 54 04/10/2016 1143   HDL 45 (L) 12/21/2014 0824   CHOLHDL 2 02/10/2020 0814   VLDL 16.8 02/10/2020 0814   LDLCALC 66 02/10/2020 0814   [place] 45 04/10/2016 1143   [place] 38 12/21/2014 0824    Hepatic Function Latest Ref Rng & Units 02/10/2020 08/16/2019  02/23/2019  Total Protein 6.0 - 8.3 g/dL 7.0 6.5 6.2  Albumin 3.5 - 5.2 g/dL 4.4 4.4 4.1  AST 0 - 37 U/L $Remo'21 22 22  'QAFTE$ ALT 0 - 35 U/L $Remo'17 15 16  'jfhlu$ Alk Phosphatase 39 - 117 U/L 57 58 48  Total Bilirubin 0.2 - 1.2 mg/dL 0.6 0.6 0.5  Bilirubin, Direct 0.0 - 0.3 mg/dL 0.2 0.1 0.1    Lab Results  Component Value Date/Time   TSH 2.89 08/16/2019 09:35 AM   TSH 2.07 02/23/2019 09:37 AM    CBC Latest Ref Rng & Units 08/16/2019 12/16/2018 08/11/2018  WBC 4.0 - 10.5 K/uL 6.1 6.3 6.0  Hemoglobin 12.0 - 15.0 g/dL 12.7 13.0 12.9  Hematocrit 36.0 - 46.0 % 37.4 40.0 38.6  Platelets 150.0 - 400.0 K/uL 268.0 254 240.0    Lab Results  Component Value Date/Time   VD25OH 67.97 08/16/2019 09:35 AM   VD25OH 58.36 08/11/2018 09:08 AM   Clinical ASCVD:  The 10-year ASCVD risk score Mikey Bussing DC Jr., et al., 2013) is: 18.2%   Values used to calculate the score:     Age: 7 years     Sex: Female     Is Non-Hispanic African American: No     Diabetic: No     Tobacco smoker: No     Systolic Blood Pressure: 423 mmHg     Is BP treated: Yes     HDL Cholesterol: 55.3 mg/dL     Total Cholesterol: 138 mg/dL    Social History  Tobacco Use  Smoking Status Never  Smokeless Tobacco Never   BP Readings from Last 3 Encounters:  05/04/20 120/70  02/09/20 118/80  01/27/20 134/68   Pulse Readings from Last 3 Encounters:  05/04/20 62  02/09/20 71  01/27/20 83   Wt Readings from Last 3 Encounters:  06/18/20 154 lb (69.9 kg)  05/04/20 157 lb (71.2 kg)  02/27/20 154 lb (69.9 kg)    Assessment: Review of patient past medical history, allergies, medications, health status, including review of consultants reports, laboratory and other test data, was performed as part of comprehensive evaluation and provision of chronic care management services.   SDOH:  (Social Determinants of Health) assessments and interventions performed: Yes   CCM Care Plan  Allergies  Allergen Reactions   Amoxicillin Hives    Did it  involve swelling of the face/tongue/throat, SOB, or low BP? No Did it involve sudden or severe rash/hives, skin peeling, or any reaction on the inside of your mouth or nose? Yes Did you need to seek medical attention at a hospital or doctor's office? No When did it last happen?   >26yrs ago     If all above answers are "NO", may proceed with cephalosporin use.    Medications Reviewed Today     Reviewed by Madelin Rear, [facility] (Pharmacist) on 10/03/20 at 49  Med List Status: <None>   Medication Order Taking? Sig Documenting Provider Last Dose Status Informant  aspirin 81 MG tablet [id number] Yes Take 81 mg by mouth daily. [provider] Taking Active Self  atorvastatin (LIPITOR) 10 MG tablet [id number] Yes Take 1 tablet (10 mg total) by mouth daily. Fay Records, MD Taking Active   Calcium Citrate-Vitamin D 250-200 MG-UNIT TABS [id number] Yes Take 2 tablets by mouth at bedtime. [provider] Taking Active Self  Coenzyme Q10 (CO Q-10 PO) [id number] Yes Take 1 capsule by mouth every morning. [provider] Taking Active Self  conjugated estrogens (PREMARIN) vaginal cream [id number] Yes Place 1.5 g vaginally once a week. Take one tablet by mouth on Sundays per patient [provider] Taking Active Self           Med Note Danny Lawless, Jason Nest Apr 19, 2015 10:30 AM)    ezetimibe (ZETIA) 10 MG tablet [id number] Yes TAKE 1/2 TABLET BY MOUTH EVERY DAY Fay Records, MD Taking Active   famotidine (PEPCID) 20 MG tablet [id number]  Take one tablet after dinner Noralyn Pick, NP  Active   GLUCOSAMINE CHONDROITIN COMPLX PO [id number] Yes Take 2 tablets by mouth daily.  [provider] Taking Active Self  meloxicam (MOBIC) 7.5 MG tablet [id number]  1-2 tabs daily as needed for pain [name], MD  Active   metoprolol succinate (TOPROL XL) 25 MG 24 hr tablet [id number] No Take 0.5 (12.5 mg)_to 1 tablet (25 mg) daily  Patient not taking: Reported on  10/03/2020   Fay Records, MD Not Taking Active   Multiple Vitamins-Minerals (ALIVE WOMENS 50+ PO) [id number] Yes Take 1 tablet by mouth every morning. [provider] Taking Active Self  OVER THE COUNTER MEDICATION [id number]  Take 1 tablet by mouth daily. Tumeric and cumin [provider]  Active Self  pantoprazole (PROTONIX) 40 MG tablet [id number] Yes TAKE 1 TABLET BY MOUTH 2 TIMES DAILY BEFORE A MEAL Milus Banister, MD Taking Active   raloxifene (EVISTA) 60 MG tablet [id number]  TAKE 1 TABLET BY MOUTH EVERY DAY [name], MD  Active   timolol (TIMOPTIC) 0.5 % ophthalmic solution [id number]  1 drop 2 (two) times daily. [provider]  Active   TRAVATAN Z 0.004 % SOLN ophthalmic solution [id number]  Place 1 drop into both eyes at bedtime.  [provider]  Active Self           Med Note (S[place]TER[place], Blanchie Serve Dec 16, 2018  3:08 PM)              Patient Active Problem List   Diagnosis Date Noted   Adjustment disorder with depressed mood 02/09/2020   Irregular heart beat 01/27/2020   Overweight (BMI 25.0-29.9) 02/09/2018   Numbness and tingling 08/05/2016   Osteoporosis 04/18/2014   Hyperlipidemia 04/18/2014   CAD (coronary artery disease), native coronary artery 01/11/2014   Chest pain 01/05/2014   Pain in the chest    Hand arthritis 03/23/2013   Eczema 02/08/2013   Routine general medical examination at a health care facility 02/06/2012   Lumbar radicular pain 09/05/2011   Fatigue 12/23/2010   Neuropathy of foot 11/04/2010   GERD 12/05/2009   DYSPAREUNIA 12/05/2009   SKIN CANCER, HX OF 12/05/2009   COLONIC POLYPS, HX OF 12/05/2009    Immunization History  Administered Date(s) Administered   Fluad Quad(high Dose 65+) 11/04/2019   Influenza Split 12/16/2011   Influenza Whole 12/05/2009, 11/04/2010   Influenza, High Dose Seasonal PF 11/30/2012, 10/30/2018   Influenza,inj,Quad PF,6+ Mos 11/15/2013, 11/28/2014, 10/15/2015,  12/16/2016, 12/03/2017   Influenza-Unspecified 10/25/2012, 10/09/2018   PFIZER(Purple Top)SARS-COV-2 Vaccination 04/01/2019, 04/26/2019, 11/22/2019   Pneumococcal Conjugate-13 01/11/2014   Pneumococcal Polysaccharide-23 09/05/2011   Pneumococcal-Unspecified 12/08/2017   Tdap 02/09/2007   Zoster Recombinat (Shingrix) 08/22/2017, 10/30/2017   Zoster, Live 12/08/2017   Conditions to be addressed/monitored: CAD, HLD, Anxiety, Depression, and GERD  Care Plan : ccm pharmacy care plan  Updates made by Madelin Rear, [facility] since 10/03/2020 12:00 AM     Problem: Osteoporosis, CAD, HLD, Anxiety, Depression, and GERD   Priority: High     Long-Range Goal: Disease management   Start Date: 10/03/2020  Expected End Date: 10/03/2021  This Visit's Progress: On track  Priority: High  Note:    Pharmacist Clinical Goal(s):  Patient will achieve adherence to monitoring guidelines and medication adherence to achieve therapeutic efficacy contact provider office for questions/concerns as evidenced notation of same in electronic health record through collaboration with PharmD and provider.   Interventions: 1:1 collaboration with [name], MD regarding development and update of comprehensive plan of care as evidenced by provider attestation and co-signature Inter-disciplinary care team collaboration (see longitudinal plan of care) Comprehensive medication review performed; medication list updated in electronic medical record  Hyperlipidemia: (LDL goal < 70) -Controlled - needing f/u labs -Previous blockage noted on CT, high risk CAC -Current treatment: Zetia 10 mg tablet - half tablet once daily (06/04/20, 08/30/20)  Atorvastatin 10 mg once daily (06/04/20, 08/30/20) - -Also on ASA 81 mg -Reviewed tolerability/side effects - no current issues -Reviewed Fill hx - no gaps noted -Educated on Cholesterol goals;  -Recommended to continue current medication  Osteopenia (Goal ensure appropriate  supplementation/tx) -Controlled -Last DEXA Scan: 04/2019 - osteopenia  -Patient is not a candidate for pharmacologic treatment -Current treatment  Vitamin D3-Calcium supplementation E[place] 60 mg once daily  -Recommend weight-bearing and muscle strengthening exercises for building and maintaining bone density. -Recommended to continue current medication  Osteoarthritis (Goal: optimize medication safety -Controlled -Receives steroid injections. Uses meloxicam every day regardless of  pain.  -Current treatment  Meloxicam 7.5 mg twice daily  -Reviewed potential risks and agreeable to transition to as needed use with tylenol as first line.    Patient Goals/Self-Care Activities Patient will:  - target a minimum of 150 minutes of moderate intensity exercise weekly  Medication Assistance: None required.  Patient affirms current coverage meets needs.  Patient's preferred pharmacy is:  [facility] - [place], [place] - [address] [place] [address] Phone: [phone] Fax: [phone]  Follow Up:  Patient agrees to Care Plan and Follow-up.  Follow Up Plan: [place] f/u call 6 months, CPA gen/adh 3 months. Scheduled for physical with pcp 11/2020    Future Appointments  Date Time Provider [place]  12/20/2020  9:00 AM [name], MD [facility]  01/09/2021  2:00 PM [facility] CCM PHARMACIST [facility]  03/04/2021  2:15 PM [facility] HEALTH COACH [facility] [place], PharmD, CPP Clinical Pharmacist Practitioner  [facility]  [phone]

## 2020-10-16 ENCOUNTER — Other Ambulatory Visit: Payer: Self-pay | Admitting: Internal Medicine

## 2020-10-31 DIAGNOSIS — H401123 Primary open-angle glaucoma, left eye, severe stage: Secondary | ICD-10-CM | POA: Diagnosis not present

## 2020-10-31 DIAGNOSIS — H401111 Primary open-angle glaucoma, right eye, mild stage: Secondary | ICD-10-CM | POA: Diagnosis not present

## 2020-10-31 DIAGNOSIS — H2513 Age-related nuclear cataract, bilateral: Secondary | ICD-10-CM | POA: Diagnosis not present

## 2020-11-06 ENCOUNTER — Other Ambulatory Visit: Payer: Self-pay

## 2020-11-06 ENCOUNTER — Telehealth (INDEPENDENT_AMBULATORY_CARE_PROVIDER_SITE_OTHER): Payer: Medicare Other | Admitting: Medical

## 2020-11-06 DIAGNOSIS — R197 Diarrhea, unspecified: Secondary | ICD-10-CM

## 2020-11-06 DIAGNOSIS — R5383 Other fatigue: Secondary | ICD-10-CM

## 2020-11-06 NOTE — Patient Instructions (Addendum)
Recent diarrhea, chills, subjective fever and sweating over the weekend.  Some 4 pound weight loss so probable mild dehydration.  Visit was done initially by video but audio was not working.  Most of the visit by phone.  Unfortunately vitals not taken.  Overall patient describes feeling improved and sounds stable.  Since this morning no diarrhea.  Other accompanying symptoms have resolved as well.  Describes  only minimal fatigue but much worse  fatigue at onset of illness.  Recent illnessmay have been a viral gastroenteritis.  I doubt COVID as did not have any accompanying respiratory type symptoms.  She also tested negative for COVID yesterday.  Stressed hydration with propel fitness water or sugar-free Gatorade.  Eat bland foods.  Avoid fried foods, meat and fruit juices.  Recommend holding Imodium presently.  Important to know if your diarrhea has resolved or if persisting.  If recurrent diarrhea/watery stools then want you to have a CBC, CMP and stool studies.  Orders are placed in epic.  Your PCP office is closest to your home.  In the event of recurrent signs or symptoms I want you to call your office and arrange for labs and stool studies.  If studies can't be done  at PCP office then notify me and could try to get you scheduled for labs at med center before the weekend.

## 2020-11-06 NOTE — Progress Notes (Addendum)
Subjective:    Patient ID: Denise Ewing, female    DOB: November 19, 1945, 75 y.o.   MRN: [id number]  HPI Virtual Visit via Telephone Note  I connected with Denise Ewing on 11/06/20 at  2:40 PM EDT by telephone and verified that I am speaking with the correct person using two identifiers.  Location: Patient: home Provider: office.  Pt did not check bp or pulse today. Audio did not work on [employer] visit.   I discussed the limitations, risks, security and privacy concerns of performing an evaluation and management service by telephone and the availability of in person appointments. I also discussed with the patient that there may be a patient responsible charge related to this service. The patient expressed understanding and agreed to proceed.   History of Present Illness:  Pt states has loose stool on Friday morning. Friday night felt chills, and felt warm. She was sweating. Never checked her temp.  No fever today. 98.4 this am.   Pt lost appetite over the weekend. Pt was vomiting on Friday.  Pt did have some abdomen pain over the weekend. She states was pain was lower abdomen below her belly button. That pain passed. No uti symptoms.  Pt appetite is better. Ate noodles and oatmeal today. No nauseu, no vomiting. No abdomen pain. O abdomen bloating.  Pt states on Friday had 2-3 loose stools. Saturday and Sunday about 5-6 loose stools each day. Pt has lost 4 pounds since last Friday.   Pt had no recent antibiotics.   Pt states this morning just had one loose stool.   Pt took 2 immodium this morning at 8 am. Since then no loose stools.  Pt did covid test yesterday and was negative. Pt had 2 covid vaccines and booster.    Observations/Objective:  General-no acute distress, pleasant, oriented. Lungs- on inspection lungs appear unlabored. Neck- no tracheal deviation or jvd on inspection. Neuro- gross motor function appears intact.   Assessment and Plan:  Patient  Instructions  Recent diarrhea, chills, subjective fever and sweating over the weekend.  Some 4 pound weight loss so probable mild dehydration.  Visit was done initially by video but audio was not working.  Most of the visit by phone.  Unfortunately vitals not taken.  Overall patient describes feeling improved and sounds stable.  Since this morning no diarrhea.  Other accompanying symptoms have resolved as well.  Describes  only minimal fatigue but much worse  fatigue at onset of illness.  Recent illnessmay have been a viral gastroenteritis.  I doubt COVID as did not have any accompanying respiratory type symptoms.  She also tested negative for COVID yesterday.  Stressed hydration with propel fitness water or sugar-free Gatorade.  Eat bland foods.  Avoid fried foods, meat and fruit juices.  Recommend holding Imodium presently.  Important to know if your diarrhea has resolved or if persisting.  If recurrent diarrhea/watery stools then want you to have a CBC, CMP and stool studies.  Orders are placed in epic.  Your PCP office is closest to your home.  In the event of recurrent signs or symptoms I want you to call your office and arrange for labs and stool studies.  If studies can't be done  at PCP office then notify me and could try to get you scheduled for labs at med center before the weekend.     Mackie Pai, PA-C   Time spent with patient today was  31 minutes which consisted of chart review, discussing  diagnosis, work up treatment and documentation.  Follow Up Instructions:    I discussed the assessment and treatment plan with the patient. The patient was provided an opportunity to ask questions and all were answered. The patient agreed with the plan and demonstrated an understanding of the instructions.   The patient was advised to call back or seek an in-person evaluation if the symptoms worsen or if the condition fails to improve as anticipated.     Mackie Pai, PA-C    Review  of Systems  Constitutional:  Positive for chills, fatigue and fever.       Now much improved/resolved.  Respiratory:  Negative for cough, chest tightness and wheezing.   Cardiovascular:  Negative for chest pain and palpitations.  Gastrointestinal:  Positive for abdominal pain, diarrhea and vomiting. Negative for abdominal distention, blood in stool and constipation.       Resolved presently.  Genitourinary:  Negative for dysuria and frequency.  Musculoskeletal:  Negative for back pain.  Skin:  Negative for rash.  Neurological:  Negative for dizziness, syncope, weakness and headaches.      Objective:   Physical Exam        Assessment & Plan:

## 2020-11-08 ENCOUNTER — Other Ambulatory Visit: Payer: Self-pay | Admitting: Family Medicine

## 2020-11-12 ENCOUNTER — Telehealth: Payer: Self-pay

## 2020-11-12 NOTE — Progress Notes (Signed)
    Chronic Care Management Pharmacy Assistant   Name: Denise Ewing  MRN: [id number] DOB: [birth year]   Reason for Encounter: Disease State - General Adherence Call    Recent office visits:  11/06/20 Mackie Pai, MD - Family Medicine (Video Visit) - Diarrhea - C Diff and stool study labs ordered. Follow up not indicated.   Recent consult visits:  None noted.  Hospital visits:  None in previous 6 months  Medications: Outpatient Encounter Medications as of 11/12/2020  Medication Sig   aspirin 81 MG tablet Take 81 mg by mouth daily.   atorvastatin (LIPITOR) 10 MG tablet Take 1 tablet (10 mg total) by mouth daily.   Calcium Citrate-Vitamin D 250-200 MG-UNIT TABS Take 2 tablets by mouth at bedtime.   Coenzyme Q10 (CO Q-10 PO) Take 1 capsule by mouth every morning.   conjugated estrogens (PREMARIN) vaginal cream Place 1.5 g vaginally once a week. Take one tablet by mouth on Sundays per patient   ezetimibe (ZETIA) 10 MG tablet TAKE 1/2 TABLET BY MOUTH EVERY DAY   famotidine (PEPCID) 20 MG tablet Take one tablet after dinner   GLUCOSAMINE CHONDROITIN COMPLX PO Take 2 tablets by mouth daily.    meloxicam (MOBIC) 7.5 MG tablet 1-2 tabs daily as needed for pain   metoprolol succinate (TOPROL XL) 25 MG 24 hr tablet Take 0.5 (12.5 mg)_to 1 tablet (25 mg) daily (Patient not taking: Reported on 10/03/2020)   Multiple Vitamins-Minerals (ALIVE WOMENS 50+ PO) Take 1 tablet by mouth every morning.   OVER THE COUNTER MEDICATION Take 1 tablet by mouth daily. Tumeric and cumin   pantoprazole (PROTONIX) 40 MG tablet TAKE 1 TABLET BY MOUTH 2 TIMES DAILY BEFORE A MEAL   raloxifene (EVISTA) 60 MG tablet TAKE 1 TABLET BY MOUTH EVERY DAY   timolol (TIMOPTIC) 0.5 % ophthalmic solution 1 drop 2 (two) times daily.   TRAVATAN Z 0.004 % SOLN ophthalmic solution Place 1 drop into both eyes at bedtime.    No facility-administered encounter medications on file as of 11/12/2020.    Have you had any problems  recently with your health? Patient denied having and recent problems or concerns with her health.   Have you had any problems with your pharmacy? Patient denied any problems with her current pharmacy.   What issues or side effects are you having with your medications? Patient denied any side effects or issues with her current medications.  What would you like me to pass along to Madelin Rear, CPP for them to help you with?  Patient did not have anything to pass along to CPP at this time.   What can we do to take care of you better? Patient did not have any suggestions at this time. She is current satisfied with her current level of care.   Care Gaps  AWV: done 02/27/20 Colonoscopy: due 06/18/21 DM Eye Exam: N/A DM Foot Exam: N/A Microalbumin: N/A HbgAIC: N/A DEXA: due 03/02/21 Mammogram: due 03/20/2021   Star Rating Drugs: Atorvastatin (LIPITOR) 10 MG tablet - last filled 08/30/20 90 days  Future Appointments  Date Time Provider [place]  12/20/2020  9:00 AM [name], MD [facility]  01/09/2021  2:00 PM [facility] CCM PHARMACIST [facility]  03/04/2021  2:15 PM [facility] HEALTH COACH [facility] [employer] [place] Pharmacist Assistant  [phone]  Time Spent: 30 minutes

## 2020-11-16 ENCOUNTER — Other Ambulatory Visit: Payer: Self-pay

## 2020-11-16 ENCOUNTER — Ambulatory Visit (INDEPENDENT_AMBULATORY_CARE_PROVIDER_SITE_OTHER): Payer: Medicare Other | Admitting: Family Medicine

## 2020-11-16 DIAGNOSIS — Z23 Encounter for immunization: Secondary | ICD-10-CM

## 2020-12-04 ENCOUNTER — Other Ambulatory Visit: Payer: Self-pay | Admitting: Internal Medicine

## 2020-12-05 DIAGNOSIS — H401111 Primary open-angle glaucoma, right eye, mild stage: Secondary | ICD-10-CM | POA: Diagnosis not present

## 2020-12-05 DIAGNOSIS — H401123 Primary open-angle glaucoma, left eye, severe stage: Secondary | ICD-10-CM | POA: Diagnosis not present

## 2020-12-10 ENCOUNTER — Other Ambulatory Visit: Payer: Self-pay | Admitting: Family Medicine

## 2020-12-20 ENCOUNTER — Encounter: Payer: Self-pay | Admitting: Family Medicine

## 2020-12-20 ENCOUNTER — Ambulatory Visit (INDEPENDENT_AMBULATORY_CARE_PROVIDER_SITE_OTHER): Payer: Medicare Other | Admitting: Family Medicine

## 2020-12-20 ENCOUNTER — Other Ambulatory Visit: Payer: Self-pay

## 2020-12-20 VITALS — BP 122/70 | HR 66 | Temp 97.9°F | Resp 18 | Ht 61.5 in | Wt 157.8 lb

## 2020-12-20 DIAGNOSIS — Z0000 Encounter for general adult medical examination without abnormal findings: Secondary | ICD-10-CM

## 2020-12-20 DIAGNOSIS — M818 Other osteoporosis without current pathological fracture: Secondary | ICD-10-CM

## 2020-12-20 DIAGNOSIS — E785 Hyperlipidemia, unspecified: Secondary | ICD-10-CM | POA: Diagnosis not present

## 2020-12-20 LAB — LIPID PANEL
Cholesterol: 135 mg/dL (ref 0–200)
HDL: 57.9 mg/dL (ref >39.00)
LDL Cholesterol: 61 mg/dL (ref 0–99)
NonHDL: 77.2
Total CHOL/HDL Ratio: 2
Triglycerides: 82 mg/dL (ref 0.0–149.0)
VLDL: 16.4 mg/dL (ref 0.0–40.0)

## 2020-12-20 LAB — CBC WITH DIFFERENTIAL/PLATELET
Basophils Absolute: 0.1 10*3/uL (ref 0.0–0.1)
Basophils Relative: 1.5 % (ref 0.0–3.0)
Eosinophils Absolute: 0.3 10*3/uL (ref 0.0–0.7)
Eosinophils Relative: 4.8 % (ref 0.0–5.0)
HCT: 39.5 % (ref 36.0–46.0)
Hemoglobin: 13.1 g/dL (ref 12.0–15.0)
Lymphocytes Relative: 35.4 % (ref 12.0–46.0)
Lymphs Abs: 2.5 10*3/uL (ref 0.7–4.0)
MCHC: 33.3 g/dL (ref 30.0–36.0)
MCV: 94.7 fl (ref 78.0–100.0)
Monocytes Absolute: 0.5 10*3/uL (ref 0.1–1.0)
Monocytes Relative: 7.3 % (ref 3.0–12.0)
Neutro Abs: 3.5 10*3/uL (ref 1.4–7.7)
Neutrophils Relative %: 51 % (ref 43.0–77.0)
Platelets: 245 10*3/uL (ref 150.0–400.0)
RBC: 4.17 Mil/uL (ref 3.87–5.11)
RDW: 13.4 % (ref 11.5–15.5)
WBC: 6.9 10*3/uL (ref 4.0–10.5)

## 2020-12-20 LAB — HEPATIC FUNCTION PANEL
ALT: 18 U/L (ref 0–35)
AST: 29 U/L (ref 0–37)
Albumin: 4.4 g/dL (ref 3.5–5.2)
Alkaline Phosphatase: 51 U/L (ref 39–117)
Bilirubin, Direct: 0.1 mg/dL (ref 0.0–0.3)
Total Bilirubin: 0.7 mg/dL (ref 0.2–1.2)
Total Protein: 6.6 g/dL (ref 6.0–8.3)

## 2020-12-20 LAB — BASIC METABOLIC PANEL
BUN: 15 mg/dL (ref 6–23)
CO2: 26 mEq/L (ref 19–32)
Calcium: 9.4 mg/dL (ref 8.4–10.5)
Chloride: 103 mEq/L (ref 96–112)
Creatinine, Ser: 0.86 mg/dL (ref 0.40–1.20)
GFR: 65.8 mL/min (ref >60.00)
Glucose, Bld: 91 mg/dL (ref 70–99)
Potassium: 4.1 mEq/L (ref 3.5–5.1)
Sodium: 138 mEq/L (ref 135–145)

## 2020-12-20 LAB — TSH: TSH: 1.87 u[IU]/mL (ref 0.35–5.50)

## 2020-12-20 LAB — VITAMIN D 25 HYDROXY (VIT D DEFICIENCY, FRACTURES): VITD: 85.41 ng/mL (ref 30.00–100.00)

## 2020-12-20 NOTE — Assessment & Plan Note (Signed)
UTD on DEXA.  Check Vit D and replete prn. 

## 2020-12-20 NOTE — Assessment & Plan Note (Signed)
Chronic problem.  Tolerating statin w/o difficulty.  Check labs.  Adjust meds prn  

## 2020-12-20 NOTE — Patient Instructions (Signed)
Follow up in 1 year or as needed We'll notify you of your lab results and make any changes if needed Continue to work on healthy diet and regular exercise- you can do it! Call with any questions or concerns Stay Safe!  Stay Healthy! Happy [place] Birthday!!!

## 2020-12-20 NOTE — Assessment & Plan Note (Signed)
Pt's PE WNL.  UTD on mammo, DEXA, cologuard, vaccines.  Check labs.  Anticipatory guidance provided.

## 2020-12-20 NOTE — Progress Notes (Signed)
   Subjective:    Patient ID: Denise Ewing, female    DOB: April 23, 1945, 75 y.o.   MRN: [id number]  HPI CPE- UTD on cologuard, mammo, DEXA, PNA vaccines, flu.  Pt reports feeling good.  No concerns today.  Patient Care Team    Relationship Specialty Notifications Start End  [name], MD PCP - General   02/06/10   Fay Records, MD PCP - Cardiology Cardiology Admissions 03/21/19   Linda Hedges, DO Consulting Physician Obstetrics and Gynecology  04/18/14   Marygrace Drought, MD Consulting Physician Ophthalmology  08/02/15   Milus Banister, MD Attending Physician Gastroenterology  02/09/19   Madelin Rear, [facility]  Pharmacist  05/24/19    Comment: phone number [phone]     Health Maintenance  Topic Date Due   COVID-19 Vaccine (4 - Booster for [place] series) 01/05/2021 (Originally 01/17/2020)   TETANUS/TDAP  06/18/2021 (Originally 02/08/2017)   Hepatitis C Screening  12/20/2021 (Originally 12/12/1962)   DEXA SCAN  03/02/2021   MAMMOGRAM  03/20/2021   Pneumonia Vaccine 34+ Years old  Completed   INFLUENZA VACCINE  Completed   Zoster Vaccines- Shingrix  Completed   HPV VACCINES  Aged Out      Review of Systems Patient reports no vision/ hearing changes, adenopathy,fever, weight change,  persistant/recurrent hoarseness , swallowing issues, chest pain, palpitations, edema, persistant/recurrent cough, hemoptysis, dyspnea (rest/exertional/paroxysmal nocturnal), gastrointestinal bleeding (melena, rectal bleeding), abdominal pain, significant heartburn, bowel changes, GU symptoms (dysuria, hematuria, incontinence), Gyn symptoms (abnormal  bleeding, pain),  syncope, focal weakness, memory loss, numbness & tingling, skin/hair/nail changes, abnormal bruising or bleeding, anxiety, or depression.   This visit occurred during the SARS-CoV-2 public health emergency.  Safety protocols were in place, including screening questions prior to the visit, additional usage of staff PPE, and extensive  cleaning of exam room while observing appropriate contact time as indicated for disinfecting solutions.      Objective:   Physical Exam General Appearance:    Alert, cooperative, no distress, appears stated age  Head:    Normocephalic, without obvious abnormality, atraumatic  Eyes:    PERRL, conjunctiva/corneas clear, EOM's intact, fundi    benign, both eyes  Ears:    Normal TM's and external ear canals, both ears  Nose:   Deferred due to COVID  Throat:   Neck:   Supple, symmetrical, trachea midline, no adenopathy;    Thyroid: no enlargement/tenderness/nodules  Back:     Symmetric, no curvature, ROM normal, no CVA tenderness  Lungs:     Clear to auscultation bilaterally, respirations unlabored  Chest Wall:    No tenderness or deformity   Heart:    Regular rate and rhythm, S1 and S2 normal, no murmur, rub   or gallop  Breast Exam:    Deferred to mammo  Abdomen:     Soft, non-tender, bowel sounds active all four quadrants,    no masses, no organomegaly  Genitalia:    Deferred to GYN  Rectal:    Extremities:   Extremities normal, atraumatic, no cyanosis or edema  Pulses:   2+ and symmetric all extremities  Skin:   Skin color, texture, turgor normal, no rashes or lesions  Lymph nodes:   Cervical, supraclavicular, and axillary nodes normal  Neurologic:   CNII-XII intact, normal strength, sensation and reflexes    throughout          Assessment & Plan:

## 2020-12-31 ENCOUNTER — Telehealth (INDEPENDENT_AMBULATORY_CARE_PROVIDER_SITE_OTHER): Payer: Medicare Other | Admitting: Registered Nurse

## 2020-12-31 DIAGNOSIS — U071 COVID-19: Secondary | ICD-10-CM

## 2020-12-31 MED ORDER — PROMETHAZINE-DM 6.25-15 MG/5ML PO SYRP: 5.0000 mL | ORAL_SOLUTION | Freq: Four times a day (QID) | ORAL | 0 refills | Status: DC | PRN

## 2020-12-31 MED ORDER — NIRMATRELVIR/RITONAVIR (PAXLOVID)TABLET: 3.0000 | ORAL_TABLET | Freq: Two times a day (BID) | ORAL | 0 refills | Status: AC

## 2020-12-31 MED ORDER — BENZONATATE 100 MG PO CAPS: 100.0000 mg | ORAL_CAPSULE | Freq: Two times a day (BID) | ORAL | 0 refills | Status: DC | PRN

## 2020-12-31 NOTE — Progress Notes (Addendum)
Telemedicine Encounter- SOAP NOTE Established Patient  This telephone encounter was conducted with the patient's (or proxy's) verbal consent via audio telecommunications: yes/no: Yes Patient was instructed to have this encounter in a suitably private space; and to only have persons present to whom they give permission to participate. In addition, patient identity was confirmed by use of name plus two identifiers (DOB and address).  I discussed the limitations, risks, security and privacy concerns of performing an evaluation and management service by telephone and the availability of in person appointments. I also discussed with the patient that there may be a patient responsible charge related to this service. The patient expressed understanding and agreed to proceed.  I spent a total of 16 minutes talking with the patient or their proxy.  Patient at home Provider in office  Participants: Kathrin Ruddy, NP and [name] and Juliann Pulse, husband  Chief Complaint  Patient presents with   Covid Positive    Tested positive Friday, symptoms onset Friday.    Subjective   Denise Ewing is a 75 y.o. established patient. Telephone visit today for COVID+  HPI Symptoms onset Friday with mild cough Tested positive Has had low grade temp, mucus production, cough  Has taken guaifenesin and tylenol with limited relief  Husband is also sick  Reviewed recent labs  Denies shob, doe, chest pain, headaches, nvd  Patient Active Problem List   Diagnosis Date Noted   Adjustment disorder with depressed mood 02/09/2020   Irregular heart beat 01/27/2020   Overweight (BMI 25.0-29.9) 02/09/2018   Numbness and tingling 08/05/2016   Osteoporosis 04/18/2014   Hyperlipidemia 04/18/2014   CAD (coronary artery disease), native coronary artery 01/11/2014   Chest pain 01/05/2014   Pain in the chest    Hand arthritis 03/23/2013   Eczema 02/08/2013   Routine general medical examination at a health care  facility 02/06/2012   Lumbar radicular pain 09/05/2011   Fatigue 12/23/2010   Neuropathy of foot 11/04/2010   GERD 12/05/2009   DYSPAREUNIA 12/05/2009   SKIN CANCER, HX OF 12/05/2009   COLONIC POLYPS, HX OF 12/05/2009    Past Medical History:  Diagnosis Date   Allergy    post nasal drip   Arthritis    Blood transfusion without reported diagnosis    with hysterectomy 11 years ago   GERD (gastroesophageal reflux disease)    Glaucoma    bilateral eyes   History of colonic polyps    Melanoma of thigh (HCC)    Osteoporosis     Current Outpatient Medications  Medication Sig Dispense Refill   benzonatate (TESSALON) 100 MG capsule Take 1 capsule (100 mg total) by mouth 2 (two) times daily as needed for cough. 20 capsule 0   nirmatrelvir/ritonavir EUA (PAXLOVID) 20 x 150 MG & 10 x 100MG  TABS Take 3 tablets by mouth 2 (two) times daily for 5 days. (Take nirmatrelvir 150 mg two tablets twice daily for 5 days and ritonavir 100 mg one tablet twice daily for 5 days) Patient GFR is 65 30 tablet 0   promethazine-dextromethorphan (PROMETHAZINE-DM) 6.25-15 MG/5ML syrup Take 5 mLs by mouth 4 (four) times daily as needed for cough. 118 mL 0   aspirin 81 MG tablet Take 81 mg by mouth daily.     atorvastatin (LIPITOR) 10 MG tablet TAKE 1 TABLET BY MOUTH EVERY DAY 90 tablet 0   Calcium Citrate-Vitamin D 250-200 MG-UNIT TABS Take 2 tablets by mouth at bedtime.     Coenzyme Q10 (CO  Q-10 PO) Take 1 capsule by mouth every morning.     conjugated estrogens (PREMARIN) vaginal cream Place 1.5 g vaginally once a week. Take one tablet by mouth on Sundays per patient     dorzolamide-timolol (COSOPT) 22.3-6.8 MG/ML ophthalmic solution 1 drop 2 (two) times daily.     ezetimibe (ZETIA) 10 MG tablet TAKE 1/2 TABLET BY MOUTH EVERY DAY 45 tablet 1   famotidine (PEPCID) 20 MG tablet Take one tablet after dinner 30 tablet 3   GLUCOSAMINE CHONDROITIN COMPLX PO Take 2 tablets by mouth daily.      Influenza Vac High-Dose  Quad (FLUZONE HIGH-DOSE QUADRIVALENT) 0.7 ML SUSY Fluzone High-Dose Quad 2020-21 (PF) 240 mcg/0.7 mL IM syringe  PHARMACIST ADMINISTERED IMMUNIZATION ADMINISTERED AT TIME OF DISPENSING     meloxicam (MOBIC) 7.5 MG tablet TAKE 1 to 2 TABLETS BY MOUTH DAILY as needed for pain 60 tablet 3   metoprolol succinate (TOPROL XL) 25 MG 24 hr tablet Take 0.5 (12.5 mg)_to 1 tablet (25 mg) daily (Patient not taking: No sig reported) 90 tablet 3   Multiple Vitamins-Minerals (ALIVE WOMENS 50+ PO) Take 1 tablet by mouth every morning.     OVER THE COUNTER MEDICATION Take 1 tablet by mouth daily. Tumeric and cumin     pantoprazole (PROTONIX) 40 MG tablet TAKE 1 TABLET BY MOUTH 2 TIMES DAILY BEFORE A MEAL 180 tablet 2   raloxifene (EVISTA) 60 MG tablet TAKE 1 TABLET BY MOUTH EVERY DAY 90 tablet 0   timolol (TIMOPTIC) 0.5 % ophthalmic solution 1 drop 2 (two) times daily.     TRAVATAN Z 0.004 % SOLN ophthalmic solution Place 1 drop into both eyes at bedtime.   1   No current facility-administered medications for this visit.    Allergies  Allergen Reactions   Amoxicillin Hives    Did it involve swelling of the face/tongue/throat, SOB, or low BP? No Did it involve sudden or severe rash/hives, skin peeling, or any reaction on the inside of your mouth or nose? Yes Did you need to seek medical attention at a hospital or doctor's office? No When did it last happen?   >62yrs ago     If all above answers are "NO", may proceed with cephalosporin use.    Social History   Socioeconomic History   Marital status: Married    Spouse name: Not on file   Number of children: Not on file   Years of education: Not on file   Highest education level: Not on file  Occupational History   Not on file  Tobacco Use   Smoking status: Never   Smokeless tobacco: Never  Vaping Use   Vaping Use: Never used  Substance and Sexual Activity   Alcohol use: Yes    Comment:  occasionally   Drug use: No   Sexual activity: Not on  file  Other Topics Concern   Not on file  Social History Narrative   Not on file   Social Determinants of Health   Financial Resource Strain: Low Risk    Difficulty of Paying Living Expenses: Not hard at all  Food Insecurity: No Food Insecurity   Worried About Running Out of Food in the Last Year: Never true   [place] in the Last Year: Never true  Transportation Needs: No Transportation Needs   Lack of Transportation (Medical): No   Lack of Transportation (Non-Medical): No  Physical Activity: Insufficiently Active   Days of Exercise per Week: 5 days  Minutes of Exercise per Session: 20 min  Stress: No Stress Concern Present   Feeling of Stress : Only a little  Social Connections: Moderately Integrated   Frequency of Communication with Friends and Family: More than three times a week   Frequency of Social Gatherings with Friends and Family: More than three times a week   Attends Religious Services: Never   Marine scientist or Organizations: Yes   Attends Music therapist: More than 4 times per year   Marital Status: Married  Human resources officer Violence: Not At Risk   Fear of Current or Ex-Partner: No   Emotionally Abused: No   Physically Abused: No   Sexually Abused: No    ROS Per hpi   Objective  No apparent shob on phone. No evidence of cognitive changes from baseline.  Vitals as reported by the patient: There were no vitals filed for this visit.  Sheketa was seen today for covid positive.  Diagnoses and all orders for this visit:  COVID-19 -     nirmatrelvir/ritonavir EUA (PAXLOVID) 20 x 150 MG & 10 x 100MG  TABS; Take 3 tablets by mouth 2 (two) times daily for 5 days. (Take nirmatrelvir 150 mg two tablets twice daily for 5 days and ritonavir 100 mg one tablet twice daily for 5 days) Patient GFR is 65 -     promethazine-dextromethorphan (PROMETHAZINE-DM) 6.25-15 MG/5ML syrup; Take 5 mLs by mouth 4 (four) times daily as needed for cough. -      benzonatate (TESSALON) 100 MG capsule; Take 1 capsule (100 mg total) by mouth 2 (two) times daily as needed for cough.   PLAN Discussed options with patient. Will opt for paxlovid as above.  Promethazine-dextromethorphan and tessalon for cough relief. Discussed risks, benefits, alternatives, and addressed concerns regarding medications. Pt voices understanding. Return precautions reviewed. Isolation guidelines reviewed Hold atorvastatin while taking paxlovid Patient encouraged to call clinic with any questions, comments, or concerns.   I discussed the assessment and treatment plan with the patient. The patient was provided an opportunity to ask questions and all were answered. The patient agreed with the plan and demonstrated an understanding of the instructions.   The patient was advised to call back or seek an in-person evaluation if the symptoms worsen or if the condition fails to improve as anticipated.  I provided 16 minutes of non-face-to-face time during this encounter.  [name], NP

## 2021-01-02 ENCOUNTER — Encounter: Payer: Self-pay | Admitting: Registered Nurse

## 2021-01-02 NOTE — Progress Notes (Signed)
Chronic Care Management Pharmacy Note  01/09/2021 Name:  Denise Ewing MRN:  [id number] DOB:  [birth year]  Summary: No changes at this time, patient doing well overall.   Plan: FU 6 months  Subjective: Denise Ewing is an 75 y.o. year old female who is a primary patient of Tabori, Aundra Millet, MD.  The CCM team was consulted for assistance with disease management and care coordination needs.    Engaged with patient by telephone for follow up visit in response to provider referral for pharmacy case management and/or care coordination services.   Recent OV's 12/20/20 Birdie Riddle) - all labs normal - no changes at this time  Consent to Services:  The patient was given information about Chronic Care Management services, agreed to services, and gave verbal consent prior to initiation of services.  Please see initial visit note for detailed documentation.   Patient Care Team: [name], MD as PCP - Desma Paganini, MD as PCP - Cardiology (Cardiology) Linda Hedges, DO as Consulting Physician (Obstetrics and Gynecology) Marygrace Drought, MD as Consulting Physician (Ophthalmology) Milus Banister, MD as Attending Physician (Gastroenterology) Edythe Clarity, [facility] (Pharmacist)  Objective:  Lab Results  Component Value Date   CREATININE 0.86 12/20/2020   CREATININE 0.87 02/10/2020   CREATININE 0.81 08/16/2019    No results found for: HGBA1C Last diabetic Eye exam: No results found for: HMDIABEYEEXA  Last diabetic Foot exam: No results found for: HMDIABFOOTEX      Component Value Date/Time   CHOL 135 12/20/2020 0958   CHOL 110 04/10/2016 1143   CHOL 96 (L) 12/21/2014 0824   TRIG 82.0 12/20/2020 0958   TRIG 66 12/21/2014 0824   HDL 57.90 12/20/2020 0958   HDL 54 04/10/2016 1143   HDL 45 (L) 12/21/2014 0824   CHOLHDL 2 12/20/2020 0958   VLDL 16.4 12/20/2020 0958   LDLCALC 61 12/20/2020 0958   [place] 45 04/10/2016 1143   [place] 38 12/21/2014 0824     Hepatic Function Latest Ref Rng & Units 12/20/2020 02/10/2020 08/16/2019  Total Protein 6.0 - 8.3 g/dL 6.6 7.0 6.5  Albumin 3.5 - 5.2 g/dL 4.4 4.4 4.4  AST 0 - 37 U/L _0 ALT 0 - 35 U/L _1 Alk Phosphatase 39 - 117 U/L 51 57 58  Total Bilirubin 0.2 - 1.2 mg/dL 0.7 0.6 0.6  Bilirubin, Direct 0.0 - 0.3 mg/dL 0.1 0.2 0.1    Lab Results  Component Value Date/Time   TSH 1.87 12/20/2020 09:58 AM   TSH 2.89 08/16/2019 09:35 AM    CBC Latest Ref Rng & Units 12/20/2020 08/16/2019 12/16/2018  WBC 4.0 - 10.5 K/uL 6.9 6.1 6.3  Hemoglobin 12.0 - 15.0 g/dL 13.1 12.7 13.0  Hematocrit 36.0 - 46.0 % 39.5 37.4 40.0  Platelets 150.0 - 400.0 K/uL 245.0 268.0 254    Lab Results  Component Value Date/Time   VD25OH 85.41 12/20/2020 09:58 AM   VD25OH 67.97 08/16/2019 09:35 AM   Clinical ASCVD:  The 10-year ASCVD risk score (Arnett DK, et al., 2019) is: 21%   Values used to calculate the score:     Age: 52 years     Sex: Female     Is Non-Hispanic African American: No     Diabetic: No     Tobacco smoker: No     Systolic Blood Pressure: 166 mmHg     Is BP treated: Yes     HDL Cholesterol: 57.9 mg/dL  Total Cholesterol: 135 mg/dL    Social History   Tobacco Use  Smoking Status Never  Smokeless Tobacco Never   BP Readings from Last 3 Encounters:  12/20/20 122/70  05/04/20 120/70  02/09/20 118/80   Pulse Readings from Last 3 Encounters:  12/20/20 66  05/04/20 62  02/09/20 71   Wt Readings from Last 3 Encounters:  12/20/20 157 lb 12.8 oz (71.6 kg)  06/18/20 154 lb (69.9 kg)  05/04/20 157 lb (71.2 kg)    Assessment: Review of patient past medical history, allergies, medications, health status, including review of consultants reports, laboratory and other test data, was performed as part of comprehensive evaluation and provision of chronic care management services.   SDOH:  (Social Determinants of Health) assessments and interventions performed: Yes   CCM Care  Plan  Allergies  Allergen Reactions   Amoxicillin Hives    Did it involve swelling of the face/tongue/throat, SOB, or low BP? No Did it involve sudden or severe rash/hives, skin peeling, or any reaction on the inside of your mouth or nose? Yes Did you need to seek medical attention at a hospital or doctor's office? No When did it last happen?   >21yr ago     If all above answers are "NO", may proceed with cephalosporin use.    Medications Reviewed Today     Reviewed by DEdythe Clarity [facility](Pharmacist) on 01/09/21 at 198 Med List Status: <None>   Medication Order Taking? Sig Documenting Provider Last Dose Status Informant  aspirin 81 MG tablet [id number] Take 81 mg by mouth daily. [provider] Taking Active Self  atorvastatin (LIPITOR) 10 MG tablet [id number] TAKE 1 TABLET BY MOUTH EVERY DAY RFay Records MD Taking Active   benzonatate (TESSALON) 100 MG capsule [id number] Take 1 capsule (100 mg total) by mouth 2 (two) times daily as needed for cough. MMaximiano Coss NP Taking Active   Calcium Citrate-Vitamin D 250-200 MG-UNIT TABS [id number] Take 2 tablets by mouth at bedtime. [provider] Taking Active Self  Coenzyme Q10 (CO Q-10 PO) [id number] Take 1 capsule by mouth every morning. [provider] Taking Active Self  conjugated estrogens (PREMARIN) vaginal cream [id number] Place 1.5 g vaginally once a week. Take one tablet by mouth on Sundays per patient [provider] Taking Active Self           Med Note ([facility] TJunius Argyle  Thu Apr 19, 2015 10:30 AM)    dorzolamide-timolol (COSOPT) 22.3-6.8 MG/ML ophthalmic solution [id number] 1 drop 2 (two) times daily. [provider] Taking Active   ezetimibe (ZETIA) 10 MG tablet [id number] TAKE 1/2 TABLET BY MOUTH EVERY DAY RFay Records MD Taking Active   famotidine (PEPCID) 20 MG tablet [id number] Take one tablet after dinner KNoralyn Pick NP Taking  Active   GLUCOSAMINE CHONDROITIN COMPLX PO [id number] Take 2 tablets by mouth daily.  [provider] Taking Active Self  Influenza Vac High-Dose Quad (FLUZONE HIGH-DOSE QUADRIVALENT) 0.7 ML SUSY [id number] Fluzone High-Dose Quad 2020-21 (PF) 240 mcg/0.7 mL IM syringe  PHARMACIST ADMINISTERED IMMUNIZATION ADMINISTERED AT TIME OF DISPENSING [provider] Taking Active   meloxicam (MOBIC) 7.5 MG tablet [id number] TAKE 1 to 2 TABLETS BY MOUTH DAILY as needed for pain TMidge Minium MD Taking Active   metoprolol succinate (TOPROL XL) 25 MG 24 hr tablet [id number] Take 0.5 (12.5 mg)_to 1 tablet (25 mg) daily  Fay Records, MD Taking Active   Multiple Vitamins-Minerals (ALIVE WOMENS 50+ PO) [id number] Yes Take 1 tablet by mouth every morning. [provider] Taking Active Self  ondansetron (ZOFRAN) 4 MG tablet [id number] Yes Take 1 tablet (4 mg total) by mouth every 8 (eight) hours as needed for nausea or vomiting. [name], NP Taking Active   OVER THE COUNTER MEDICATION [id number] Yes Take 1 tablet by mouth daily. Tumeric and cumin [provider] Taking Active Self  pantoprazole (PROTONIX) 40 MG tablet [id number] Yes TAKE 1 TABLET BY MOUTH 2 TIMES DAILY BEFORE A MEAL Milus Banister, MD Taking Active   promethazine-dextromethorphan (PROMETHAZINE-DM) 6.25-15 MG/5ML syrup [id number] Yes Take 5 mLs by mouth 4 (four) times daily as needed for cough. [name], NP Taking Active   raloxifene (EVISTA) 60 MG tablet [id number] Yes TAKE 1 TABLET BY MOUTH EVERY DAY [name], MD Taking Active   timolol (TIMOPTIC) 0.5 % ophthalmic solution [id number] Yes 1 drop 2 (two) times daily. [provider] Taking Active   TRAVATAN Z 0.004 % SOLN ophthalmic solution [id number] Yes Place 1 drop into both eyes at bedtime.  [provider] Taking Active Self           Med Note ([place], Blanchie Serve Dec 16, 2018  3:08 PM)               Patient Active Problem List   Diagnosis Date Noted   Adjustment disorder with depressed mood 02/09/2020   Irregular heart beat 01/27/2020   Overweight (BMI 25.0-29.9) 02/09/2018   Numbness and tingling 08/05/2016   Osteoporosis 04/18/2014   Hyperlipidemia 04/18/2014   CAD (coronary artery disease), native coronary artery 01/11/2014   Chest pain 01/05/2014   Pain in the chest    Hand arthritis 03/23/2013   Eczema 02/08/2013   Routine general medical examination at a health care facility 02/06/2012   Lumbar radicular pain 09/05/2011   Fatigue 12/23/2010   Neuropathy of foot 11/04/2010   GERD 12/05/2009   DYSPAREUNIA 12/05/2009   SKIN CANCER, HX OF 12/05/2009   COLONIC POLYPS, HX OF 12/05/2009    Immunization History  Administered Date(s) Administered   Fluad Quad(high Dose 65+) 11/04/2019, 11/16/2020   Influenza Split 12/16/2011   Influenza Whole 12/05/2009, 11/04/2010   Influenza, High Dose Seasonal PF 11/30/2012, 10/30/2018   Influenza,inj,Quad PF,6+ Mos 11/15/2013, 11/28/2014, 10/15/2015, 12/16/2016, 12/03/2017   Influenza-Unspecified 10/25/2012, 10/09/2018   PFIZER(Purple Top)SARS-COV-2 Vaccination 04/01/2019, 04/26/2019, 11/22/2019   Pneumococcal Conjugate-13 01/11/2014   Pneumococcal Polysaccharide-23 09/05/2011   Pneumococcal-Unspecified 12/08/2017   Tdap 02/09/2007   Zoster Recombinat (Shingrix) 08/22/2017, 10/30/2017   Zoster, Live 12/08/2017   Conditions to be addressed/monitored: CAD, HLD, Anxiety, Depression, and GERD  Care Plan : ccm pharmacy care plan  Updates made by Edythe Clarity, RPH since 01/09/2021 12:00 AM     Problem: Osteoporosis, CAD, HLD, Anxiety, Depression, and GERD   Priority: High     Long-Range Goal: Disease management   Start Date: 10/03/2020  Expected End Date: 10/03/2021  Recent Progress: On track  Priority: High  Note:    Pharmacist Clinical Goal(s):  Patient will achieve adherence to monitoring guidelines and  medication adherence to achieve therapeutic efficacy contact provider office for questions/concerns as evidenced notation of same in electronic health record through collaboration with PharmD and provider.   Interventions: 1:1 collaboration with [name], MD regarding development and update of comprehensive plan of care as evidenced by provider attestation and co-signature  Inter-disciplinary care team collaboration (see longitudinal plan of care) Comprehensive medication review performed; medication list updated in electronic medical record  Hyperlipidemia: (LDL goal < 70) -Controlled  -Previous blockage noted on CT, high risk CAC -Current treatment: Zetia 10 mg tablet - half tablet once daily (06/04/20, 08/30/20)  Atorvastatin 10 mg once daily (06/04/20, 08/30/20) - -Also on ASA 81 mg -Reviewed tolerability/side effects - no current issues -Reviewed Fill hx - no gaps noted -Educated on Cholesterol goals;  -Recommended to continue current medication  Update 01/09/21 Most recent LDL is controlled in October. Continues above medications with no issues. Fill dates show adherence. Continue meds as previous no changes needed.  Osteopenia (Goal ensure appropriate supplementation/tx) -Controlled -Last DEXA Scan: 04/2019 - osteopenia  -Patient is not a candidate for pharmacologic treatment -Current treatment  Vitamin D3-Calcium supplementation Evista 60 mg once daily  -Recommend weight-bearing and muscle strengthening exercises for building and maintaining bone density. -Recommended to continue current medication  Osteoarthritis (Goal: optimize medication safety -Controlled -Receives steroid injections. Uses meloxicam every day regardless of pain.  -Current treatment  Meloxicam 7.5 mg twice daily  -Reviewed potential risks and agreeable to transition to as needed use with tylenol as first line.    Update 01/09/21 Tried to titrate slowly to just Tylenol and use meloxicam as  needed.  She could not tolerate the increase in pain.  Is back using meloxicam, however she is just using 7.7m once daily.  This controls her pain.  Counseled on avoidance of other NSAIDs. Continue as current, no changes at this time.  Patient Goals/Self-Care Activities Patient will:  - target a minimum of 150 minutes of moderate intensity exercise weekly  Medication Assistance: None required.  Patient affirms current coverage meets needs.  [facility] [place] [address] [id number]: [phone]: [phone] Follow Up:  Patient agrees to Care Plan and Follow-up.  Follow Up Plan: RGilmanf/u call 6 months, CPA gen/adh 3 months.        Future Appointments  Date Time Provider DGeorge 03/07/2021  2:15 PM [facility] [place] PharmD Clinical Pharmacist  [facility] [place] ([phone]

## 2021-01-02 NOTE — Telephone Encounter (Signed)
Spoke with patient and informed her to stop taking the medication and she has a mychart visit for in the morning.

## 2021-01-03 ENCOUNTER — Encounter: Payer: Self-pay | Admitting: Registered Nurse

## 2021-01-03 ENCOUNTER — Other Ambulatory Visit: Payer: Self-pay

## 2021-01-03 ENCOUNTER — Telehealth (INDEPENDENT_AMBULATORY_CARE_PROVIDER_SITE_OTHER): Payer: Medicare Other | Admitting: Registered Nurse

## 2021-01-03 DIAGNOSIS — R112 Nausea with vomiting, unspecified: Secondary | ICD-10-CM | POA: Diagnosis not present

## 2021-01-03 DIAGNOSIS — R197 Diarrhea, unspecified: Secondary | ICD-10-CM | POA: Diagnosis not present

## 2021-01-03 MED ORDER — ONDANSETRON HCL 4 MG PO TABS: 4.0000 mg | ORAL_TABLET | Freq: Three times a day (TID) | ORAL | 0 refills | Status: DC | PRN

## 2021-01-03 NOTE — Patient Instructions (Addendum)
Ms. Hammonds -   Doristine Devoid to speak with you  I have sent zofran to help with nausea and diarrhea.  Rehydrate aggressively with water and Pedialyte.  Focus on blander foods - bananas, rice, apple sauce, and toast.  If any new concerns arise, let me know or seek in person eval.   Talk to you soon,  Rich     If you have lab work done today you will be contacted with your lab results within the next 2 weeks.  If you have not heard from [place] then please contact us. The fastest way to get your results is to register for My Chart.   IF you received an x-ray today, you will receive an invoice from [facility]. Please contact [facility] at [phone] with questions or concerns regarding your invoice.   IF you received labwork today, you will receive an invoice from [place]. Please contact [facility] at [phone] with questions or concerns regarding your invoice.   Our billing staff will not be able to assist you with questions regarding bills from these companies.  You will be contacted with the lab results as soon as they are available. The fastest way to get your results is to activate your My Chart account. Instructions are located on the last page of this paperwork. If you have not heard from [place] regarding the results in 2 weeks, please contact this office.

## 2021-01-03 NOTE — Progress Notes (Signed)
Telemedicine Encounter- SOAP NOTE Established Patient  This telephone encounter was conducted with the patient's (or proxy's) verbal consent via audio telecommunications: yes/no: Yes Patient was instructed to have this encounter in a suitably private space; and to only have persons present to whom they give permission to participate. In addition, patient identity was confirmed by use of name plus two identifiers (DOB and address).  I discussed the limitations, risks, security and privacy concerns of performing an evaluation and management service by telephone and the availability of in person appointments. I also discussed with the patient that there may be a patient responsible charge related to this service. The patient expressed understanding and agreed to proceed.  I spent a total of 18 minutes talking with the patient or their proxy.  Patient at home Provider in office  Participants: Kathrin Ruddy, NP and [name]  Chief Complaint  Patient presents with   Medication Reaction    Patient states she is having medication reaction to Paxlovid. Pt states when she takes the medication 30 minutes later she is having serve diarrhea and throwing up with a metal tatse.    Subjective   Denise Ewing is a 75 y.o. established patient. Telephone visit today for med reaction  HPI Seen on 12/31/20 for COVID Started on Paxlovid Unfortunately soon after she had nausea, vomiting, and diarrhea. Was ongoing for the first two days. She has been unable to keep food or liquids down.  Fortunately today she has been feeling somewhat better - keeping down oatmeal, less coughing.   Working on hydrating.   No coffee ground emesis, blood in stool, LOC, lightheadedness, dizziness.   Patient Active Problem List   Diagnosis Date Noted   Adjustment disorder with depressed mood 02/09/2020   Irregular heart beat 01/27/2020   Overweight (BMI 25.0-29.9) 02/09/2018   Numbness and tingling 08/05/2016    Osteoporosis 04/18/2014   Hyperlipidemia 04/18/2014   CAD (coronary artery disease), native coronary artery 01/11/2014   Chest pain 01/05/2014   Pain in the chest    Hand arthritis 03/23/2013   Eczema 02/08/2013   Routine general medical examination at a health care facility 02/06/2012   Lumbar radicular pain 09/05/2011   Fatigue 12/23/2010   Neuropathy of foot 11/04/2010   GERD 12/05/2009   DYSPAREUNIA 12/05/2009   SKIN CANCER, HX OF 12/05/2009   COLONIC POLYPS, HX OF 12/05/2009    Past Medical History:  Diagnosis Date   Allergy    post nasal drip   Arthritis    Blood transfusion without reported diagnosis    with hysterectomy 11 years ago   GERD (gastroesophageal reflux disease)    Glaucoma    bilateral eyes   History of colonic polyps    Melanoma of thigh (HCC)    Osteoporosis     Current Outpatient Medications  Medication Sig Dispense Refill   aspirin 81 MG tablet Take 81 mg by mouth daily.     atorvastatin (LIPITOR) 10 MG tablet TAKE 1 TABLET BY MOUTH EVERY DAY 90 tablet 0   benzonatate (TESSALON) 100 MG capsule Take 1 capsule (100 mg total) by mouth 2 (two) times daily as needed for cough. 20 capsule 0   Calcium Citrate-Vitamin D 250-200 MG-UNIT TABS Take 2 tablets by mouth at bedtime.     Coenzyme Q10 (CO Q-10 PO) Take 1 capsule by mouth every morning.     conjugated estrogens (PREMARIN) vaginal cream Place 1.5 g vaginally once a week. Take one tablet by mouth on  Sundays per patient     dorzolamide-timolol (COSOPT) 22.3-6.8 MG/ML ophthalmic solution 1 drop 2 (two) times daily.     ezetimibe (ZETIA) 10 MG tablet TAKE 1/2 TABLET BY MOUTH EVERY DAY 45 tablet 1   famotidine (PEPCID) 20 MG tablet Take one tablet after dinner 30 tablet 3   GLUCOSAMINE CHONDROITIN COMPLX PO Take 2 tablets by mouth daily.      Influenza Vac High-Dose Quad (FLUZONE HIGH-DOSE QUADRIVALENT) 0.7 ML SUSY Fluzone High-Dose Quad 2020-21 (PF) 240 mcg/0.7 mL IM syringe  PHARMACIST ADMINISTERED  IMMUNIZATION ADMINISTERED AT TIME OF DISPENSING     meloxicam (MOBIC) 7.5 MG tablet TAKE 1 to 2 TABLETS BY MOUTH DAILY as needed for pain 60 tablet 3   metoprolol succinate (TOPROL XL) 25 MG 24 hr tablet Take 0.5 (12.5 mg)_to 1 tablet (25 mg) daily 90 tablet 3   Multiple Vitamins-Minerals (ALIVE WOMENS 50+ PO) Take 1 tablet by mouth every morning.     nirmatrelvir/ritonavir EUA (PAXLOVID) 20 x 150 MG & 10 x 100MG  TABS Take 3 tablets by mouth 2 (two) times daily for 5 days. (Take nirmatrelvir 150 mg two tablets twice daily for 5 days and ritonavir 100 mg one tablet twice daily for 5 days) Patient GFR is 65 30 tablet 0   OVER THE COUNTER MEDICATION Take 1 tablet by mouth daily. Tumeric and cumin     pantoprazole (PROTONIX) 40 MG tablet TAKE 1 TABLET BY MOUTH 2 TIMES DAILY BEFORE A MEAL 180 tablet 2   promethazine-dextromethorphan (PROMETHAZINE-DM) 6.25-15 MG/5ML syrup Take 5 mLs by mouth 4 (four) times daily as needed for cough. 118 mL 0   raloxifene (EVISTA) 60 MG tablet TAKE 1 TABLET BY MOUTH EVERY DAY 90 tablet 0   timolol (TIMOPTIC) 0.5 % ophthalmic solution 1 drop 2 (two) times daily.     TRAVATAN Z 0.004 % SOLN ophthalmic solution Place 1 drop into both eyes at bedtime.   1   No current facility-administered medications for this visit.    Allergies  Allergen Reactions   Amoxicillin Hives    Did it involve swelling of the face/tongue/throat, SOB, or low BP? No Did it involve sudden or severe rash/hives, skin peeling, or any reaction on the inside of your mouth or nose? Yes Did you need to seek medical attention at a hospital or doctor's office? No When did it last happen?   >62yrs ago     If all above answers are "NO", may proceed with cephalosporin use.    Social History   Socioeconomic History   Marital status: Married    Spouse name: Not on file   Number of children: Not on file   Years of education: Not on file   Highest education level: Not on file  Occupational History    Not on file  Tobacco Use   Smoking status: Never   Smokeless tobacco: Never  Vaping Use   Vaping Use: Never used  Substance and Sexual Activity   Alcohol use: Yes    Comment:  occasionally   Drug use: No   Sexual activity: Not on file  Other Topics Concern   Not on file  Social History Narrative   Not on file   Social Determinants of Health   Financial Resource Strain: Low Risk    Difficulty of Paying Living Expenses: Not hard at all  Food Insecurity: No Food Insecurity   Worried About [place] in the Last Year: Never true   Ran Out of  Food in the Last Year: Never true  Transportation Needs: No Transportation Needs   Lack of Transportation (Medical): No   Lack of Transportation (Non-Medical): No  Physical Activity: Insufficiently Active   Days of Exercise per Week: 5 days   Minutes of Exercise per Session: 20 min  Stress: No Stress Concern Present   Feeling of Stress : Only a little  Social Connections: Moderately Integrated   Frequency of Communication with Friends and Family: More than three times a week   Frequency of Social Gatherings with Friends and Family: More than three times a week   Attends Religious Services: Never   Marine scientist or Organizations: Yes   Attends Music therapist: More than 4 times per year   Marital Status: Married  Human resources officer Violence: Not At Risk   Fear of Current or Ex-Partner: No   Emotionally Abused: No   Physically Abused: No   Sexually Abused: No    Review of Systems  Constitutional: Negative.   HENT: Negative.    Eyes: Negative.   Respiratory: Negative.    Cardiovascular: Negative.   Gastrointestinal:  Positive for diarrhea, nausea and vomiting. Negative for abdominal pain, blood in stool, constipation, heartburn and melena.  Genitourinary: Negative.   Musculoskeletal: Negative.   Skin: Negative.   Neurological: Negative.   Endo/Heme/Allergies: Negative.   Psychiatric/Behavioral:  Negative.    All other systems reviewed and are negative.  Objective   Vitals as reported by the patient: There were no vitals filed for this visit.  There are no diagnoses linked to this encounter.  PLAN Pt improving. Supportive care with hydration and pedialyte discussed. Will give zofran as above for relief from nausea and vomiting. Expect continued improvement, plan to call patient tomorrow to check in  ER and urgent care precautions given. Pt voices understanding Patient encouraged to call clinic with any questions, comments, or concerns.  I discussed the assessment and treatment plan with the patient. The patient was provided an opportunity to ask questions and all were answered. The patient agreed with the plan and demonstrated an understanding of the instructions.   The patient was advised to call back or seek an in-person evaluation if the symptoms worsen or if the condition fails to improve as anticipated.  I provided 16 minutes of non-face-to-face time during this encounter.  [name], NP

## 2021-01-04 ENCOUNTER — Telehealth: Payer: Self-pay | Admitting: Registered Nurse

## 2021-01-04 NOTE — Telephone Encounter (Signed)
Called patient to check on n/v and med reaction  No answer  Lvm wishing patient well and asking her to call back if worsening or failing to improve  Kathrin Ruddy, NP

## 2021-01-09 ENCOUNTER — Ambulatory Visit (INDEPENDENT_AMBULATORY_CARE_PROVIDER_SITE_OTHER): Payer: Medicare Other | Admitting: Pharmacist

## 2021-01-09 DIAGNOSIS — E785 Hyperlipidemia, unspecified: Secondary | ICD-10-CM

## 2021-01-09 DIAGNOSIS — M19049 Primary osteoarthritis, unspecified hand: Secondary | ICD-10-CM

## 2021-01-09 NOTE — Patient Instructions (Addendum)
Visit Information   Goals Addressed             This Visit's Progress    Manage Pain-Osteoarthritis       Timeframe:  Long-Range Goal Priority:  High Start Date:  01/09/21                           Expected End Date:  07/09/21                     Follow Up Date 04/11/21    - call for medicine refill 2 or 3 days before it runs out - develop a personal pain management plan - prioritize tasks for the day - stay active    Why is this important?   Day-to-day life can be hard when you have joint pain.  Pain medicine is just one piece of the treatment puzzle. There are many things you can do to manage pain and stay strong.   Lifestyle changes like stopping smoking and eating foods with Vitamin D and calcium keeps your bones and muscles healthy. Your joints are better when supported by strong muscles.  You can try these action steps to help you manage your pain.     Notes:        Patient Care Plan: [facility] pharmacy care plan     Problem Identified: Osteoporosis, CAD, HLD, Anxiety, Depression, and GERD   Priority: High     Long-Range Goal: Disease management   Start Date: 10/03/2020  Expected End Date: 10/03/2021  Recent Progress: On track  Priority: High  Note:    Pharmacist Clinical Goal(s):  Patient will achieve adherence to monitoring guidelines and medication adherence to achieve therapeutic efficacy contact provider office for questions/concerns as evidenced notation of same in electronic health record through collaboration with PharmD and provider.   Interventions: 1:1 collaboration with [name], MD regarding development and update of comprehensive plan of care as evidenced by provider attestation and co-signature Inter-disciplinary care team collaboration (see longitudinal plan of care) Comprehensive medication review performed; medication list updated in electronic medical record  Hyperlipidemia: (LDL goal < 70) -Controlled  -Previous blockage noted on  CT, high risk CAC -Current treatment: Zetia 10 mg tablet - half tablet once daily (06/04/20, 08/30/20)  Atorvastatin 10 mg once daily (06/04/20, 08/30/20) - -Also on ASA 81 mg -Reviewed tolerability/side effects - no current issues -Reviewed Fill hx - no gaps noted -Educated on Cholesterol goals;  -Recommended to continue current medication  Update 01/09/21 Most recent LDL is controlled in October. Continues above medications with no issues. Fill dates show adherence. Continue meds as previous no changes needed.  Osteopenia (Goal ensure appropriate supplementation/tx) -Controlled -Last DEXA Scan: 04/2019 - osteopenia  -Patient is not a candidate for pharmacologic treatment -Current treatment  Vitamin D3-Calcium supplementation Evista 60 mg once daily  -Recommend weight-bearing and muscle strengthening exercises for building and maintaining bone density. -Recommended to continue current medication  Osteoarthritis (Goal: optimize medication safety -Controlled -Receives steroid injections. Uses meloxicam every day regardless of pain.  -Current treatment  Meloxicam 7.5 mg twice daily  -Reviewed potential risks and agreeable to transition to as needed use with tylenol as first line.    Update 01/09/21 Tried to titrate slowly to just Tylenol and use meloxicam as needed.  She could not tolerate the increase in pain.  Is back using meloxicam, however she is just using 7.5mg  once daily.  This controls her pain.  Counseled on avoidance of other NSAIDs. Continue as current, no changes at this time.  Patient Goals/Self-Care Activities Patient will:  - target a minimum of 150 minutes of moderate intensity exercise weekly  Medication Assistance: None required.  Patient affirms current coverage meets needs.  Patient's preferred pharmacy is:  [facility] - [place], [place] - [address] [place] [address] Phone: [phone] Fax: [phone]  Follow Up:   Patient agrees to Care Plan and Follow-up.  Follow Up Plan: [place] f/u call 6 months, [facility] gen/adh 3 months.         Patient verbalizes understanding of instructions provided today and agrees to view in [place].  Telephone follow up appointment with pharmacy team member scheduled for: 6 months  Edythe Clarity, [place]

## 2021-01-23 DIAGNOSIS — E785 Hyperlipidemia, unspecified: Secondary | ICD-10-CM

## 2021-01-23 DIAGNOSIS — M19049 Primary osteoarthritis, unspecified hand: Secondary | ICD-10-CM

## 2021-01-29 NOTE — Progress Notes (Signed)
Cardiology Office Note   Date:  01/30/2021   ID:  Tamilyn Lupien, DOB November 18, 1945, MRN [id number]  PCP:  [name], MD  Cardiologist:   Dorris Carnes, MD   FU of HL and CAD      History of Present Illness: Denise Ewing is a 75 y.o. female with a history of atypical CP, CAD by CT angiogram (Stenosis 50% or less LAD; calcium score 126) She was seen in ED on 12/16/18 for CP   R/O for MI  Sent home     I last saw the pt in Dec 2021 She complained of palpitations.  She wore a monitor after that visit that showed sinus rhythm, short burst of SVT.  Recommend a trial of a low-dose Toprol.  The pt did not try it   Family member a Runner, broadcasting/film/video They recomm following BP closely   The patient also cut back on Caffeine      She says she never started taking  Zoloft or metoprolol     Doing better now  Coping  Talking with daughter more (source of stress)    Pt notes occasional palpitations  Denies  dizziness   Breathing is OK  No CP     Current Meds  Medication Sig   aspirin 81 MG tablet Take 81 mg by mouth daily.   atorvastatin (LIPITOR) 10 MG tablet TAKE 1 TABLET BY MOUTH EVERY DAY   benzonatate (TESSALON) 100 MG capsule Take 1 capsule (100 mg total) by mouth 2 (two) times daily as needed for cough.   Calcium Citrate-Vitamin D 250-200 MG-UNIT TABS Take 2 tablets by mouth at bedtime.   Coenzyme Q10 (CO Q-10 PO) Take 1 capsule by mouth every morning.   conjugated estrogens (PREMARIN) vaginal cream Place 1.5 g vaginally once a week. Take one tablet by mouth on Sundays per patient   dorzolamide-timolol (COSOPT) 22.3-6.8 MG/ML ophthalmic solution 1 drop 2 (two) times daily.   ezetimibe (ZETIA) 10 MG tablet TAKE 1/2 TABLET BY MOUTH EVERY DAY   famotidine (PEPCID) 20 MG tablet Take one tablet after dinner   GLUCOSAMINE CHONDROITIN COMPLX PO Take 2 tablets by mouth daily.    Influenza Vac High-Dose Quad (FLUZONE HIGH-DOSE QUADRIVALENT) 0.7 ML SUSY Fluzone High-Dose Quad 2020-21 (PF) 240 mcg/0.7 mL IM  syringe  PHARMACIST ADMINISTERED IMMUNIZATION ADMINISTERED AT TIME OF DISPENSING   meloxicam (MOBIC) 7.5 MG tablet TAKE 1 to 2 TABLETS BY MOUTH DAILY as needed for pain   metoprolol succinate (TOPROL XL) 25 MG 24 hr tablet Take 0.5 (12.5 mg)_to 1 tablet (25 mg) daily   Multiple Vitamins-Minerals (ALIVE WOMENS 50+ PO) Take 1 tablet by mouth every morning.   OVER THE COUNTER MEDICATION Take 1 tablet by mouth daily. Tumeric and cumin   pantoprazole (PROTONIX) 40 MG tablet TAKE 1 TABLET BY MOUTH 2 TIMES DAILY BEFORE A MEAL   promethazine-dextromethorphan (PROMETHAZINE-DM) 6.25-15 MG/5ML syrup Take 5 mLs by mouth 4 (four) times daily as needed for cough.   raloxifene (EVISTA) 60 MG tablet TAKE 1 TABLET BY MOUTH EVERY DAY   TRAVATAN Z 0.004 % SOLN ophthalmic solution Place 1 drop into both eyes at bedtime.      Allergies:   Amoxicillin   Past Medical History:  Diagnosis Date   Allergy    post nasal drip   Arthritis    Blood transfusion without reported diagnosis    with hysterectomy 11 years ago   GERD (gastroesophageal reflux disease)    Glaucoma  bilateral eyes   History of colonic polyps    Melanoma of thigh ([place])    Osteoporosis     Past Surgical History:  Procedure Laterality Date   Colon polyps removed     Melanoma removed     R thigh   TOTAL ABDOMINAL HYSTERECTOMY W/ BILATERAL SALPINGOOPHORECTOMY     uterine fibroids     Social History:  The patient  reports that she has never smoked. She has never used smokeless tobacco. She reports current alcohol use. She reports that she does not use drugs.   Family History:  The patient's family history includes Arrhythmia in her brother and sister; Breast cancer (age of onset: 34) in her mother; CAD in her sister; Cancer in her mother; Hypertension in her mother; Hypothyroidism in her sister; Osteoporosis in her mother; Rectal cancer in her mother.    ROS:  Please see the history of present illness. All other systems are reviewed  and  Negative to the above problem except as noted.    PHYSICAL EXAM: VS:  BP (!) 142/70   Pulse 66   Ht 5' 1.5" (1.562 m)   Wt 158 lb 9.6 oz (71.9 kg)   SpO2 98%   BMI 29.48 kg/m   GEN: Pt is a 75 yo in no acute distress  HEENT: normal  Neck: JVP is normal  No carotid bruits Cardiac: RRR; no murmurs.  No  LE edema  Respiratory:  clear to auscultation bilaterally  GI: soft, nontender, nondistended, + BS  No hepatomegaly  MS: no deformity Moving all extremities   Skin: warm and dry, no rash Neuro:  Strength and sensation are intact Psych: euthymic mood, full affect   EKG:  EKG is ordered today.    NSR 66 bpm   Lipid Panel    Component Value Date/Time   CHOL 135 12/20/2020 0958   CHOL 110 04/10/2016 1143   CHOL 96 (L) 12/21/2014 0824   TRIG 82.0 12/20/2020 0958   TRIG 66 12/21/2014 0824   HDL 57.90 12/20/2020 0958   HDL 54 04/10/2016 1143   HDL 45 (L) 12/21/2014 0824   CHOLHDL 2 12/20/2020 0958   VLDL 16.4 12/20/2020 0958   LDLCALC 61 12/20/2020 0958   LDLCALC 45 04/10/2016 1143   LDLCALC 38 12/21/2014 0824      Wt Readings from Last 3 Encounters:  01/30/21 158 lb 9.6 oz (71.9 kg)  12/20/20 157 lb 12.8 oz (71.6 kg)  06/18/20 154 lb (69.9 kg)      ASSESSMENT AND PLAN:   1  CAD Pt with mild dz   Pt remains asymptomatic    2  Palpitations   Pt doing better without medicine    Follow     3 HL  Last LDL 61  Continue lipitro    4  HTN   BP is  controlled  Contninue meds   5  Diet  Discussed low carb, TRE   pt has done before   WIll start again   F/U in 1 year   Current medicines are reviewed at length with the patient today.  The patient does not have concerns regarding medicines.  Signed, Dorris Carnes, MD  01/30/2021 10:42 AM    [facility] [place], [place], [place]  [address] Phone: [phone]; Fax: [phone]

## 2021-01-30 ENCOUNTER — Other Ambulatory Visit: Payer: Self-pay

## 2021-01-30 ENCOUNTER — Encounter: Payer: Self-pay | Admitting: Internal Medicine

## 2021-01-30 ENCOUNTER — Ambulatory Visit: Payer: Medicare Other | Admitting: Internal Medicine

## 2021-01-30 VITALS — BP 142/70 | HR 66 | Ht 61.5 in | Wt 158.6 lb

## 2021-01-30 DIAGNOSIS — R002 Palpitations: Secondary | ICD-10-CM

## 2021-01-30 NOTE — Patient Instructions (Signed)
Medication Instructions:  Your physician recommends that you continue on your current medications as directed. Please refer to the Current Medication list given to you today.  *If you need a refill on your cardiac medications before your next appointment, please call your pharmacy*   Lab Work: none If you have labs (blood work) drawn today and your tests are completely normal, you will receive your results only by: MyChart Message (if you have MyChart) OR A paper copy in the mail If you have any lab test that is abnormal or we need to change your treatment, we will call you to review the results.   Testing/Procedures: none   Follow-Up: At [facility], you and your health needs are our priority.  As part of our continuing mission to provide you with exceptional heart care, we have created designated Provider Care Teams.  These Care Teams include your primary Cardiologist (physician) and Advanced Practice Providers (APPs -  Physician Assistants and Nurse Practitioners) who all work together to provide you with the care you need, when you need it.  We recommend signing up for the patient portal called "MyChart".  Sign up information is provided on this After Visit Summary.  MyChart is used to connect with patients for Virtual Visits (Telemedicine).  Patients are able to view lab/test results, encounter notes, upcoming appointments, etc.  Non-urgent messages can be sent to your provider as well.   To learn more about what you can do with MyChart, go to [email or web address].    Your next appointment:   1 year(s)  The format for your next appointment:   In Person  Provider:   Paula Ross, MD     Other Instructions   

## 2021-02-01 ENCOUNTER — Telehealth (INDEPENDENT_AMBULATORY_CARE_PROVIDER_SITE_OTHER): Payer: Medicare Other | Admitting: Medical

## 2021-02-01 DIAGNOSIS — K047 Periapical abscess without sinus: Secondary | ICD-10-CM

## 2021-02-01 DIAGNOSIS — K0889 Other specified disorders of teeth and supporting structures: Secondary | ICD-10-CM

## 2021-02-01 MED ORDER — DOXYCYCLINE HYCLATE 100 MG PO TABS: 100.0000 mg | ORAL_TABLET | Freq: Two times a day (BID) | ORAL | 0 refills | Status: DC

## 2021-02-01 NOTE — Progress Notes (Signed)
   Subjective:    Patient ID: Denise Ewing, female    DOB: 03-04-1945, 75 y.o.   MRN: [id number]  HPI  Virtual Visit via Video Note  I connected with Denise Ewing on 02/01/21 at  4:00 PM EST by a video enabled telemedicine application and verified that I am speaking with the correct person using two identifiers.  Location: Patient: home Provider: home   I discussed the limitations of evaluation and management by telemedicine and the availability of in person appointments. The patient expressed understanding and agreed to proceed.  History of Present Illness: Pt has bottom left gum swelling around one of her teeth. Pt state side of her face starting to hurt. Pt started last night. Pt dentist office was closed.   Pt is allergic to amoxicillin. HIves.  No fever, no chills or sweats.   Pt states on lymphadenopathy.  Pt has 3/10 level pain.    Observations/Objective: General-no acute distress, pleasant, oriented. Lungs- on inspection lungs appear unlabored. Neck- no tracheal deviation or jvd on inspection. No obvious swelling on inspection. Neuro- gross motor function appears intact.   Assessment and Plan:  Patient Instructions  Tooth infection with pain. Hive reaction to amoxicillin. On review pt med list has used doxy in past and tolerated. Sent rx to pharmacy. Rx advisement given. If bp less than 140/90 can use meloxicam 15 mg daily. Can also use tylenol for pain.  If signs and symtpoms worsen as described and appointment with dentist delayed then be seen in ED.  Ask you call dentist on Monday. Hopefully they will see you within 10 days/sooner if needed.   Mackie Pai, PA-C  Time spent with patient today was 16 minutes which consisted of chart revdiew, discussing diagnosis, work up treatment and documentation.  Follow Up Instructions:    I discussed the assessment and treatment plan with the patient. The patient was provided an opportunity to ask questions and all were  answered. The patient agreed with the plan and demonstrated an understanding of the instructions.   The patient was advised to call back or seek an in-person evaluation if the symptoms worsen or if the condition fails to improve as anticipated.     Mackie Pai, PA-C   Review of Systems     Objective:   Physical Exam        Assessment & Plan:

## 2021-02-01 NOTE — Patient Instructions (Signed)
Tooth infection with pain. Hive reaction to amoxicillin. On review pt med list has used doxy in past and tolerated. Sent rx to pharmacy. Rx advisement given. If bp less than 140/90 can use meloxicam 15 mg daily. Can also use tylenol for pain.  If signs and symtpoms worsen as described and appointment with dentist delayed then be seen in ED.  Ask you call dentist on Monday. Hopefully they will see you within 10 days/sooner if needed.

## 2021-02-08 ENCOUNTER — Other Ambulatory Visit: Payer: Self-pay | Admitting: Family Medicine

## 2021-02-28 ENCOUNTER — Telehealth: Payer: Medicare Other | Admitting: Family Medicine

## 2021-02-28 DIAGNOSIS — J069 Acute upper respiratory infection, unspecified: Secondary | ICD-10-CM | POA: Diagnosis not present

## 2021-02-28 MED ORDER — BENZONATATE 100 MG PO CAPS: 100.0000 mg | ORAL_CAPSULE | Freq: Two times a day (BID) | ORAL | 0 refills | Status: DC | PRN

## 2021-02-28 NOTE — Progress Notes (Signed)
Virtual Visit Consent   Denise Ewing, you are scheduled for a virtual visit with a [facility] provider today.     Just as with appointments in the office, your consent must be obtained to participate.  Your consent will be active for this visit and any virtual visit you may have with one of our providers in the next 365 days.     If you have a MyChart account, a copy of this consent can be sent to you electronically.  All virtual visits are billed to your insurance company just like a traditional visit in the office.    As this is a virtual visit, video technology does not allow for your provider to perform a traditional examination.  This may limit your provider's ability to fully assess your condition.  If your provider identifies any concerns that need to be evaluated in person or the need to arrange testing (such as labs, EKG, etc.), we will make arrangements to do so.     Although advances in technology are sophisticated, we cannot ensure that it will always work on either your end or our end.  If the connection with a video visit is poor, the visit may have to be switched to a telephone visit.  With either a video or telephone visit, we are not always able to ensure that we have a secure connection.     I need to obtain your verbal consent now.   Are you willing to proceed with your visit today?    Denise Ewing has provided verbal consent on 02/28/2021 for a virtual visit (video or telephone).   Denise Mayo, NP   Date: 02/28/2021 9:42 AM   Virtual Visit via Video Note   I, Denise Ewing, connected with  Denise Ewing  ([id number], Apr 18, 1945) on 02/28/21 at  9:45 AM EST by a video-enabled telemedicine application and verified that I am speaking with the correct person using two identifiers.  Location: Patient: Virtual Visit Location Patient: Home Provider: Virtual Visit Location Provider: Home Office   I discussed the limitations of evaluation and management by telemedicine and the  availability of in person appointments. The patient expressed understanding and agreed to proceed.    History of Present Illness: Denise Ewing is a 76 y.o. who identifies as a female who was assigned female at birth, and is being seen today for coughing with chest congestion. Sister was sick and visited and was Dx with PNA post viral from what she says.  Symptoms started on Monday- with runny nose and occasional cough. Increased coughing and congestion, trouble sleeping due to cough. Had some left over perles that helped but she is out of them. Does have headache, scratchy throat, cough.  Denies chest pain, shortness of breath (with coughing spells, otherwise ok), ear pain, fevers or chills  COVID test this morning negative  Flu- vaccines yes COVID vaccines, no booster  Problems:  Patient Active Problem List   Diagnosis Date Noted   Adjustment disorder with depressed mood 02/09/2020   Irregular heart beat 01/27/2020   Overweight (BMI 25.0-29.9) 02/09/2018   Numbness and tingling 08/05/2016   Osteoporosis 04/18/2014   Hyperlipidemia 04/18/2014   CAD (coronary artery disease), native coronary artery 01/11/2014   Chest pain 01/05/2014   Pain in the chest    Hand arthritis 03/23/2013   Eczema 02/08/2013   Routine general medical examination at a health care facility 02/06/2012   Lumbar radicular pain 09/05/2011   Fatigue 12/23/2010   Neuropathy  of foot 11/04/2010   GERD 12/05/2009   DYSPAREUNIA 12/05/2009   SKIN CANCER, HX OF 12/05/2009   COLONIC POLYPS, HX OF 12/05/2009    Allergies:  Allergies  Allergen Reactions   Amoxicillin Hives    Did it involve swelling of the face/tongue/throat, SOB, or low BP? No Did it involve sudden or severe rash/hives, skin peeling, or any reaction on the inside of your mouth or nose? Yes Did you need to seek medical attention at a hospital or doctor's office? No When did it last happen?   >33yrs ago     If all above answers are NO, may proceed  with cephalosporin use.   Medications:  Current Outpatient Medications:    aspirin 81 MG tablet, Take 81 mg by mouth daily., Disp: , Rfl:    atorvastatin (LIPITOR) 10 MG tablet, TAKE 1 TABLET BY MOUTH EVERY DAY, Disp: 90 tablet, Rfl: 0   benzonatate (TESSALON) 100 MG capsule, Take 1 capsule (100 mg total) by mouth 2 (two) times daily as needed for cough., Disp: 20 capsule, Rfl: 0   Calcium Citrate-Vitamin D 250-200 MG-UNIT TABS, Take 2 tablets by mouth at bedtime., Disp: , Rfl:    Coenzyme Q10 (CO Q-10 PO), Take 1 capsule by mouth every morning., Disp: , Rfl:    conjugated estrogens (PREMARIN) vaginal cream, Place 1.5 g vaginally once a week. Take one tablet by mouth on Sundays per patient, Disp: , Rfl:    dorzolamide-timolol (COSOPT) 22.3-6.8 MG/ML ophthalmic solution, 1 drop 2 (two) times daily., Disp: , Rfl:    doxycycline (VIBRA-TABS) 100 MG tablet, Take 1 tablet (100 mg total) by mouth 2 (two) times daily., Disp: 20 tablet, Rfl: 0   ezetimibe (ZETIA) 10 MG tablet, TAKE 1/2 TABLET BY MOUTH EVERY DAY, Disp: 45 tablet, Rfl: 1   famotidine (PEPCID) 20 MG tablet, Take one tablet after dinner, Disp: 30 tablet, Rfl: 3   GLUCOSAMINE CHONDROITIN COMPLX PO, Take 2 tablets by mouth daily. , Disp: , Rfl:    Influenza Vac High-Dose Quad (FLUZONE HIGH-DOSE QUADRIVALENT) 0.7 ML SUSY, Fluzone High-Dose Quad 2020-21 (PF) 240 mcg/0.7 mL IM syringe  PHARMACIST ADMINISTERED IMMUNIZATION ADMINISTERED AT TIME OF DISPENSING, Disp: , Rfl:    meloxicam (MOBIC) 7.5 MG tablet, TAKE 1 to 2 TABLETS BY MOUTH DAILY as needed for pain, Disp: 60 tablet, Rfl: 3   metoprolol succinate (TOPROL XL) 25 MG 24 hr tablet, Take 0.5 (12.5 mg)_to 1 tablet (25 mg) daily, Disp: 90 tablet, Rfl: 3   Multiple Vitamins-Minerals (ALIVE WOMENS 50+ PO), Take 1 tablet by mouth every morning., Disp: , Rfl:    ondansetron (ZOFRAN) 4 MG tablet, Take 1 tablet (4 mg total) by mouth every 8 (eight) hours as needed for nausea or vomiting., Disp: 20  tablet, Rfl: 0   OVER THE COUNTER MEDICATION, Take 1 tablet by mouth daily. Tumeric and cumin, Disp: , Rfl:    pantoprazole (PROTONIX) 40 MG tablet, TAKE 1 TABLET BY MOUTH 2 TIMES DAILY BEFORE A MEAL, Disp: 180 tablet, Rfl: 2   promethazine-dextromethorphan (PROMETHAZINE-DM) 6.25-15 MG/5ML syrup, Take 5 mLs by mouth 4 (four) times daily as needed for cough., Disp: 118 mL, Rfl: 0   raloxifene (EVISTA) 60 MG tablet, TAKE 1 TABLET BY MOUTH EVERY DAY, Disp: 90 tablet, Rfl: 0   timolol (TIMOPTIC) 0.5 % ophthalmic solution, 1 drop 2 (two) times daily., Disp: , Rfl:    TRAVATAN Z 0.004 % SOLN ophthalmic solution, Place 1 drop into both eyes at bedtime. , Disp: ,  Rfl: 1  Observations/Objective: Patient is well-developed, well-nourished in no acute distress.  Resting comfortably  at home.  Head is normocephalic, atraumatic.  No labored breathing.  Speech is clear and coherent with logical content.  Patient is alert and oriented at baseline.  Cough appreciated during visit  Assessment and Plan: 1. Viral URI with cough Cough is worse symptom today- will refill perles for her -no red flags for PNA, possible flu vs covid (neg HT though) --no fevers; only day 3/4 of illness no anbx ordered at this time  - benzonatate (TESSALON) 100 MG capsule; Take 1 capsule (100 mg total) by mouth 2 (two) times daily as needed for cough.  Dispense: 30 capsule; Refill: 0    Reviewed side effects, risks and benefits of medication.    Patient acknowledged agreement and understanding of the plan.   I discussed the assessment and treatment plan with the patient. The patient was provided an opportunity to ask questions and all were answered. The patient agreed with the plan and demonstrated an understanding of the instructions.   The patient was advised to call back or seek an in-person evaluation if the symptoms worsen or if the condition fails to improve as anticipated.   The above assessment and management plan was  discussed with the patient. The patient verbalized understanding of and has agreed to the management plan. Patient is aware to call the clinic if symptoms persist or worsen. Patient is aware when to return to the clinic for a follow-up visit. Patient educated on when it is appropriate to go to the emergency department.   Follow Up Instructions: I discussed the assessment and treatment plan with the patient. The patient was provided an opportunity to ask questions and all were answered. The patient agreed with the plan and demonstrated an understanding of the instructions.  A copy of instructions were sent to the patient via MyChart unless otherwise noted below.   The patient was advised to call back or seek an in-person evaluation if the symptoms worsen or if the condition fails to improve as anticipated.  Time:  I spent 12 minutes with the patient via telehealth technology discussing the above problems/concerns.    Denise Mayo, NP

## 2021-02-28 NOTE — Patient Instructions (Addendum)
Viral Respiratory Infection A respiratory infection is an illness that affects part of the respiratory system, such as the lungs, nose, or throat. A respiratory infection that is caused by a virus is called a viral respiratory infection. Common types of viral respiratory infections include: A cold. The flu (influenza). A respiratory syncytial virus (RSV) infection. What are the causes? This condition is caused by a virus. The virus may spread through contact with droplets or direct contact with infected people or their mucus or secretions. The virus may spread from person to person (is contagious). What are the signs or symptoms? Symptoms of this condition include: A stuffy or runny nose. A sore throat or cough. Shortness of breath or difficulty breathing. Yellow or green mucus (sputum). Other symptoms may include: A fever. Sweating or chills. Fatigue. Achy muscles. A headache. How is this diagnosed? This condition may be diagnosed based on: Your symptoms. A physical exam. Testing of secretions from the nose or throat. Chest X-ray. How is this treated? This condition may be treated with medicines, such as: Antiviral medicine. This may shorten the length of time a person has symptoms. Expectorants. These make it easier to cough up mucus. Decongestant nasal sprays. Acetaminophen or NSAIDs, such as ibuprofen, to relieve fever and pain. Antibiotic medicines are not prescribed for viral infections.This is because antibiotics are designed to kill bacteria. They do not kill viruses. Follow these instructions at home: Managing pain and congestion Take over-the-counter and prescription medicines only as told by your health care provider. If you have a sore throat, gargle with a mixture of salt and water 3-4 times a day or as needed. To make salt water, completely dissolve -1 tsp (3-6 g) of salt in 1 cup (237 mL) of warm water. Use nose drops made from salt water to ease congestion and  soften raw skin around your nose. Take 2 tsp (10 mL) of honey at bedtime to lessen coughing at night. Do not give honey to children who are younger than 1 year. Drink enough fluid to keep your urine pale yellow. This helps prevent dehydration and helps loosen up mucus. General instructions  Rest as much as possible. Do not drink alcohol. Do not use any products that contain nicotine or tobacco. These products include cigarettes, chewing tobacco, and vaping devices, such as e-cigarettes. If you need help quitting, ask your health care provider. Keep all follow-up visits. This is important. How is this prevented?   Get an annual flu shot. You may get the flu shot in late summer, fall, or winter. Ask your health care provider when you should get your flu shot. Avoid spreading your infection to other people. If you are sick: Wash your hands with soap and water often, especially after you cough or sneeze. Wash for at least 20 seconds. If soap and water are not available, use alcohol-based hand sanitizer. Cover your mouth when you cough. Cover your nose and mouth when you sneeze. Do not share cups or eating utensils. Clean commonly used objects often. Clean commonly touched surfaces. Stay home from work or school as told by your health care provider. Avoid contact with people who are sick during cold and flu season. This is generally fall and winter. Contact a health care provider if: Your symptoms last for 10 days or longer. Your symptoms get worse over time. You have severe sinus pain in your face or forehead. The glands in your jaw or neck become very swollen. You have shortness of breath. Get help right   away if you: Feel pain or pressure in your chest. Have trouble breathing. Faint or feel like you will faint. Have severe and persistent vomiting. Feel confused or disoriented. These symptoms may represent a serious problem that is an emergency. Do not wait to see if the symptoms will go  away. Get medical help right away. Call your local emergency services (911 in the [place] Do not drive yourself to the hospital. Summary A respiratory infection is an illness that affects part of the respiratory system, such as the lungs, nose, or throat. A respiratory infection that is caused by a virus is called a viral respiratory infection. Common types of viral respiratory infections include a cold, influenza, and respiratory syncytial virus (RSV) infection. Symptoms of this condition include a stuffy or runny nose, cough, fatigue, achy muscles, sore throat, and fevers or chills. Antibiotic medicines are not prescribed for viral infections. This is because antibiotics are designed to kill bacteria. They are not effective against viruses. This information is not intended to replace advice given to you by your health care provider. Make sure you discuss any questions you have with your health care provider. Document Revised: 05/17/2020 Document Reviewed: 05/17/2020 Elsevier Patient Education  2022 Else[employer] Inc[employer]  

## 2021-03-04 ENCOUNTER — Ambulatory Visit: Payer: Medicare Other

## 2021-03-07 ENCOUNTER — Ambulatory Visit (INDEPENDENT_AMBULATORY_CARE_PROVIDER_SITE_OTHER): Payer: Medicare Other

## 2021-03-07 ENCOUNTER — Encounter: Payer: Self-pay | Admitting: Family Medicine

## 2021-03-07 DIAGNOSIS — Z780 Asymptomatic menopausal state: Secondary | ICD-10-CM

## 2021-03-07 DIAGNOSIS — Z0000 Encounter for general adult medical examination without abnormal findings: Secondary | ICD-10-CM

## 2021-03-07 NOTE — Patient Instructions (Signed)
Denise Ewing , Thank you for taking time to come for your Medicare Wellness Visit. I appreciate your ongoing commitment to your health goals. Please review the following plan we discussed and let me know if I can assist you in the future.   Screening recommendations/referrals: Colonoscopy: no longer required  Mammogram: no longer required  Bone Density: referral 03/07/2021 Recommended yearly ophthalmology/optometry visit for glaucoma screening and checkup Recommended yearly dental visit for hygiene and checkup  Vaccinations: Influenza vaccine: completed  Pneumococcal vaccine: completed  Tdap vaccine: 02/08/2017 Shingles vaccine: completed     Advanced directives: yes   Conditions/risks identified: cough /congestion   Next appointment: 03/11/2021  150pm [name] ,NP    Preventive Care 12 Years and Older, Female Preventive care refers to lifestyle choices and visits with your health care provider that can promote health and wellness. What does preventive care include? A yearly physical exam. This is also called an annual well check. Dental exams once or twice a year. Routine eye exams. Ask your health care provider how often you should have your eyes checked. Personal lifestyle choices, including: Daily care of your teeth and gums. Regular physical activity. Eating a healthy diet. Avoiding tobacco and drug use. Limiting alcohol use. Practicing safe sex. Taking low-dose aspirin every day. Taking vitamin and mineral supplements as recommended by your health care provider. What happens during an annual well check? The services and screenings done by your health care provider during your annual well check will depend on your age, overall health, lifestyle risk factors, and family history of disease. Counseling  Your health care provider may ask you questions about your: Alcohol use. Tobacco use. Drug use. Emotional well-being. Home and relationship well-being. Sexual  activity. Eating habits. History of falls. Memory and ability to understand (cognition). Work and work Statistician. Reproductive health. Screening  You may have the following tests or measurements: Height, weight, and BMI. Blood pressure. Lipid and cholesterol levels. These may be checked every 5 years, or more frequently if you are over 46 years old. Skin check. Lung cancer screening. You may have this screening every year starting at age 70 if you have a 30-pack-year history of smoking and currently smoke or have quit within the past 15 years. Fecal occult blood test (FOBT) of the stool. You may have this test every year starting at age 49. Flexible sigmoidoscopy or colonoscopy. You may have a sigmoidoscopy every 5 years or a colonoscopy every 10 years starting at age 46. Hepatitis C blood test. Hepatitis B blood test. Sexually transmitted disease (STD) testing. Diabetes screening. This is done by checking your blood sugar (glucose) after you have not eaten for a while (fasting). You may have this done every 1-3 years. Bone density scan. This is done to screen for osteoporosis. You may have this done starting at age 22. Mammogram. This may be done every 1-2 years. Talk to your health care provider about how often you should have regular mammograms. Talk with your health care provider about your test results, treatment options, and if necessary, the need for more tests. Vaccines  Your health care provider may recommend certain vaccines, such as: Influenza vaccine. This is recommended every year. Tetanus, diphtheria, and acellular pertussis (Tdap, Td) vaccine. You may need a Td booster every 10 years. Zoster vaccine. You may need this after age 46. Pneumococcal 13-valent conjugate (PCV13) vaccine. One dose is recommended after age 66. Pneumococcal polysaccharide (PPSV23) vaccine. One dose is recommended after age 64. Talk to your health  care provider about which screenings and vaccines  you need and how often you need them. This information is not intended to replace advice given to you by your health care provider. Make sure you discuss any questions you have with your health care provider. Document Released: 03/09/2015 Document Revised: 10/31/2015 Document Reviewed: 12/12/2014 Elsevier Interactive Patient Education  2017 [place] Prevention in the Home Falls can cause injuries. They can happen to people of all ages. There are many things you can do to make your home safe and to help prevent falls. What can I do on the outside of my home? Regularly fix the edges of walkways and driveways and fix any cracks. Remove anything that might make you trip as you walk through a door, such as a raised step or threshold. Trim any bushes or trees on the path to your home. Use bright outdoor lighting. Clear any walking paths of anything that might make someone trip, such as rocks or tools. Regularly check to see if handrails are loose or broken. Make sure that both sides of any steps have handrails. Any raised decks and porches should have guardrails on the edges. Have any leaves, snow, or ice cleared regularly. Use sand or salt on walking paths during winter. Clean up any spills in your garage right away. This includes oil or grease spills. What can I do in the bathroom? Use night lights. Install grab bars by the toilet and in the tub and shower. Do not use towel bars as grab bars. Use non-skid mats or decals in the tub or shower. If you need to sit down in the shower, use a plastic, non-slip stool. Keep the floor dry. Clean up any water that spills on the floor as soon as it happens. Remove soap buildup in the tub or shower regularly. Attach bath mats securely with double-sided non-slip rug tape. Do not have throw rugs and other things on the floor that can make you trip. What can I do in the bedroom? Use night lights. Make sure that you have a light by your bed that  is easy to reach. Do not use any sheets or blankets that are too big for your bed. They should not hang down onto the floor. Have a firm chair that has side arms. You can use this for support while you get dressed. Do not have throw rugs and other things on the floor that can make you trip. What can I do in the kitchen? Clean up any spills right away. Avoid walking on wet floors. Keep items that you use a lot in easy-to-reach places. If you need to reach something above you, use a strong step stool that has a grab bar. Keep electrical cords out of the way. Do not use floor polish or wax that makes floors slippery. If you must use wax, use non-skid floor wax. Do not have throw rugs and other things on the floor that can make you trip. What can I do with my stairs? Do not leave any items on the stairs. Make sure that there are handrails on both sides of the stairs and use them. Fix handrails that are broken or loose. Make sure that handrails are as long as the stairways. Check any carpeting to make sure that it is firmly attached to the stairs. Fix any carpet that is loose or worn. Avoid having throw rugs at the top or bottom of the stairs. If you do have throw rugs, attach them to  the floor with carpet tape. Make sure that you have a light switch at the top of the stairs and the bottom of the stairs. If you do not have them, ask someone to add them for you. What else can I do to help prevent falls? Wear shoes that: Do not have high heels. Have rubber bottoms. Are comfortable and fit you well. Are closed at the toe. Do not wear sandals. If you use a stepladder: Make sure that it is fully opened. Do not climb a closed stepladder. Make sure that both sides of the stepladder are locked into place. Ask someone to hold it for you, if possible. Clearly mark and make sure that you can see: Any grab bars or handrails. First and last steps. Where the edge of each step is. Use tools that help you  move around (mobility aids) if they are needed. These include: Canes. Walkers. Scooters. Crutches. Turn on the lights when you go into a dark area. Replace any light bulbs as soon as they burn out. Set up your furniture so you have a clear path. Avoid moving your furniture around. If any of your floors are uneven, fix them. If there are any pets around you, be aware of where they are. Review your medicines with your doctor. Some medicines can make you feel dizzy. This can increase your chance of falling. Ask your doctor what other things that you can do to help prevent falls. This information is not intended to replace advice given to you by your health care provider. Make sure you discuss any questions you have with your health care provider. Document Released: 12/07/2008 Document Revised: 07/19/2015 Document Reviewed: 03/17/2014 Elsevier Interactive Patient Education  2017 [employer].

## 2021-03-07 NOTE — Progress Notes (Signed)
Subjective:   Denise Ewing is a 76 y.o. female who presents for Medicare Annual (Subsequent) preventive examination.  I connected with [name] today by telephone and verified that I am speaking with the correct person using two identifiers. Location patient: home Location provider: work Persons participating in the virtual visit: patient, provider.   I discussed the limitations, risks, security and privacy concerns of performing an evaluation and management service by telephone and the availability of in person appointments. I also discussed with the patient that there may be a patient responsible charge related to this service. The patient expressed understanding and verbally consented to this telephonic visit.    Interactive audio and video telecommunications were attempted between this provider and patient, however failed, due to patient having technical difficulties OR patient did not have access to video capability.  We continued and completed visit with audio only.    Review of Systems     Cardiac Risk Factors include: advanced age (>74men, >18 women)     Objective:    Today's Vitals   There is no height or weight on file to calculate BMI.  Advanced Directives 03/07/2021 02/27/2020 10/26/2016 01/05/2014  Does Patient Have a Medical Advance Directive? Yes Yes No Yes  Type of Paramedic of [place];Living will Living will - [place]  Does patient want to make changes to medical advance directive? - - - No - Patient declined  Copy of [place] in Chart? No - copy requested - - No - copy requested  Would patient like information on creating a medical advance directive? - - No - Patient declined -    Current Medications (verified) Outpatient Encounter Medications as of 03/07/2021  Medication Sig   aspirin 81 MG tablet Take 81 mg by mouth daily.   atorvastatin (LIPITOR) 10 MG tablet TAKE 1 TABLET BY MOUTH EVERY DAY    benzonatate (TESSALON) 100 MG capsule Take 1 capsule (100 mg total) by mouth 2 (two) times daily as needed for cough.   Calcium Citrate-Vitamin D 250-200 MG-UNIT TABS Take 2 tablets by mouth at bedtime.   Coenzyme Q10 (CO Q-10 PO) Take 1 capsule by mouth every morning.   conjugated estrogens (PREMARIN) vaginal cream Place 1.5 g vaginally once a week. Take one tablet by mouth on Sundays per patient   dorzolamide-timolol (COSOPT) 22.3-6.8 MG/ML ophthalmic solution 1 drop 2 (two) times daily.   doxycycline (VIBRA-TABS) 100 MG tablet Take 1 tablet (100 mg total) by mouth 2 (two) times daily.   ezetimibe (ZETIA) 10 MG tablet TAKE 1/2 TABLET BY MOUTH EVERY DAY   famotidine (PEPCID) 20 MG tablet Take one tablet after dinner   GLUCOSAMINE CHONDROITIN COMPLX PO Take 2 tablets by mouth daily.    Influenza Vac High-Dose Quad (FLUZONE HIGH-DOSE QUADRIVALENT) 0.7 ML SUSY Fluzone High-Dose Quad 2020-21 (PF) 240 mcg/0.7 mL IM syringe  PHARMACIST ADMINISTERED IMMUNIZATION ADMINISTERED AT TIME OF DISPENSING   meloxicam (MOBIC) 7.5 MG tablet TAKE 1 to 2 TABLETS BY MOUTH DAILY as needed for pain   metoprolol succinate (TOPROL XL) 25 MG 24 hr tablet Take 0.5 (12.5 mg)_to 1 tablet (25 mg) daily   Multiple Vitamins-Minerals (ALIVE WOMENS 50+ PO) Take 1 tablet by mouth every morning.   OVER THE COUNTER MEDICATION Take 1 tablet by mouth daily. Tumeric and cumin   pantoprazole (PROTONIX) 40 MG tablet TAKE 1 TABLET BY MOUTH 2 TIMES DAILY BEFORE A MEAL   promethazine-dextromethorphan (PROMETHAZINE-DM) 6.25-15 MG/5ML syrup Take  5 mLs by mouth 4 (four) times daily as needed for cough.   raloxifene (EVISTA) 60 MG tablet TAKE 1 TABLET BY MOUTH EVERY DAY   TRAVATAN Z 0.004 % SOLN ophthalmic solution Place 1 drop into both eyes at bedtime.    ondansetron (ZOFRAN) 4 MG tablet Take 1 tablet (4 mg total) by mouth every 8 (eight) hours as needed for nausea or vomiting.   timolol (TIMOPTIC) 0.5 % ophthalmic solution 1 drop 2 (two)  times daily.   No facility-administered encounter medications on file as of 03/07/2021.    Allergies (verified) Amoxicillin   History: Past Medical History:  Diagnosis Date   Allergy    post nasal drip   Arthritis    Blood transfusion without reported diagnosis    with hysterectomy 11 years ago   GERD (gastroesophageal reflux disease)    Glaucoma    bilateral eyes   History of colonic polyps    Melanoma of thigh ([place])    Osteoporosis    Past Surgical History:  Procedure Laterality Date   Colon polyps removed     Melanoma removed     R thigh   TOTAL ABDOMINAL HYSTERECTOMY W/ BILATERAL SALPINGOOPHORECTOMY     uterine fibroids   Family History  Problem Relation Age of Onset   Hypertension Mother    Cancer Mother        breast,anal   Osteoporosis Mother    Breast cancer Mother 45   Rectal cancer Mother    CAD Sister    Arrhythmia Sister    Hypothyroidism Sister    Arrhythmia Brother    Colon polyps Neg Hx    Esophageal cancer Neg Hx    Stomach cancer Neg Hx    Social History   Socioeconomic History   Marital status: Married    Spouse name: Not on file   Number of children: Not on file   Years of education: Not on file   Highest education level: Not on file  Occupational History   Not on file  Tobacco Use   Smoking status: Never   Smokeless tobacco: Never  Vaping Use   Vaping Use: Never used  Substance and Sexual Activity   Alcohol use: Yes    Comment:  occasionally   Drug use: No   Sexual activity: Not on file  Other Topics Concern   Not on file  Social History Narrative   Not on file   Social Determinants of Health   Financial Resource Strain: Low Risk    Difficulty of Paying Living Expenses: Not hard at all  Food Insecurity: No Food Insecurity   Worried About Charity fundraiser in the Last Year: Never true   Ran Out of Food in the Last Year: Never true  Transportation Needs: No Transportation Needs   Lack of Transportation (Medical): No    Lack of Transportation (Non-Medical): No  Physical Activity: Insufficiently Active   Days of Exercise per Week: 3 days   Minutes of Exercise per Session: 30 min  Stress: No Stress Concern Present   Feeling of Stress : Not at all  Social Connections: Moderately Integrated   Frequency of Communication with Friends and Family: Twice a week   Frequency of Social Gatherings with Friends and Family: Twice a week   Attends Religious Services: 1 to 4 times per year   Active Member of [employer] or Organizations: No   Attends Archivist Meetings: Never   Marital Status: Married    Tobacco  Counseling Counseling given: Not Answered   Clinical Intake:  Pre-visit preparation completed: Yes  Pain : No/denies pain     Nutritional Risks: None Diabetes: No  How often do you need to have someone help you when you read instructions, pamphlets, or other written materials from your doctor or pharmacy?: 1 - Never What is the last grade level you completed in school?: college  Diabetic?no   Interpreter Needed?: No  Information entered by :: [place] of Daily Living In your present state of health, do you have any difficulty performing the following activities: 03/07/2021 12/20/2020  Hearing? N N  Vision? N N  Difficulty concentrating or making decisions? N N  Walking or climbing stairs? N N  Dressing or bathing? N N  Doing errands, shopping? N N  Preparing Food and eating ? N -  Using the Toilet? N -  In the past six months, have you accidently leaked urine? N -  Do you have problems with loss of bowel control? N -  Managing your Medications? N -  Managing your Finances? N -  Housekeeping or managing your Housekeeping? N -  Some recent data might be hidden    Patient Care Team: [name], MD as PCP - General Fay Records, MD as PCP - Cardiology (Cardiology) Linda Hedges, DO as Consulting Physician (Obstetrics and Gynecology) Marygrace Drought, MD  as Consulting Physician (Ophthalmology) Milus Banister, MD as Attending Physician (Gastroenterology) Edythe Clarity, [facility] (Pharmacist)  Indicate any recent Medical Services you may have received from other than [facility] providers in the past year (date may be approximate).     Assessment:   This is a routine wellness examination for [place].  Hearing/Vision screen Vision Screening - Comments:: Annual eye exams wear glasses   Dietary issues and exercise activities discussed: Current Exercise Habits: Home exercise routine, Type of exercise: walking, Time (Minutes): 30, Frequency (Times/Week): 3, Weekly Exercise (Minutes/Week): 90, Intensity: Mild, Exercise limited by: None identified   Goals Addressed   None    Depression Screen PHQ 2/9 Scores 03/07/2021 03/07/2021 01/03/2021 12/31/2020 12/20/2020 10/03/2020 06/18/2020  PHQ - 2 Score 0 0 0 0 0 0 0  PHQ- 9 Score - - 0 0 0 - -  Exception Documentation - - - - - - -  Not completed - - - - - - -    Fall Risk Fall Risk  03/07/2021 01/03/2021 12/31/2020 12/20/2020 06/18/2020  Falls in the past year? 0 0 0 0 0  Number falls in past yr: 0 0 0 0 0  Injury with Fall? 0 0 0 0 0  Comment - - - - -  Risk for fall due to : - No Fall Risks No Fall Risks No Fall Risks -  Follow up Falls evaluation completed Falls evaluation completed Falls evaluation completed - Falls evaluation completed    [place]:  Any stairs in or around the home? No  If so, are there any without handrails? No  Home free of loose throw rugs in walkways, pet beds, electrical cords, etc? Yes  Adequate lighting in your home to reduce risk of falls? Yes   ASSISTIVE DEVICES UTILIZED TO PREVENT FALLS:  Life alert? No  Use of a cane, walker or w/c? No  Grab bars in the bathroom? No  Shower chair or bench in shower? No  Elevated toilet seat or a handicapped toilet? No    Cognitive Function:  Normal cognitive  status assessed by direct  observation by this Nurse Health Advisor. No abnormalities found.        Immunizations Immunization History  Administered Date(s) Administered   Fluad Quad(high Dose 65+) 11/04/2019, 11/16/2020   Influenza Split 12/16/2011   Influenza Whole 12/05/2009, 11/04/2010   Influenza, High Dose Seasonal PF 11/30/2012, 10/30/2018   Influenza,inj,Quad PF,6+ Mos 11/15/2013, 11/28/2014, 10/15/2015, 12/16/2016, 12/03/2017   Influenza-Unspecified 10/25/2012, 10/09/2018   PFIZER(Purple Top)SARS-COV-2 Vaccination 04/01/2019, 04/26/2019, 11/22/2019   Pneumococcal Conjugate-13 01/11/2014   Pneumococcal Polysaccharide-23 09/05/2011   Pneumococcal-Unspecified 12/08/2017   Tdap 02/09/2007   Zoster Recombinat (Shingrix) 08/22/2017, 10/30/2017   Zoster, Live 12/08/2017    TDAP status: Up to date  Flu Vaccine status: Up to date  Pneumococcal vaccine status: Up to date  Covid-19 vaccine status: Completed vaccines  Qualifies for Shingles Vaccine? Yes   Zostavax completed Yes   Shingrix Completed?: Yes  Screening Tests Health Maintenance  Topic Date Due   COVID-19 Vaccine (4 - Booster for Pfizer series) 01/17/2020   DEXA SCAN  03/02/2021   TETANUS/TDAP  06/18/2021 (Originally 02/08/2017)   Hepatitis C Screening  12/20/2021 (Originally 12/12/1963)   MAMMOGRAM  03/20/2021   Pneumonia Vaccine 95+ Years old  Completed   INFLUENZA VACCINE  Completed   Zoster Vaccines- Shingrix  Completed   HPV VACCINES  Aged Out   COLONOSCOPY (Pts 45-100yrs Insurance coverage will need to be confirmed)  Discontinued    Health Maintenance  Health Maintenance Due  Topic Date Due   COVID-19 Vaccine (4 - Booster for Nixon series) 01/17/2020   DEXA SCAN  03/02/2021    Colorectal cancer screening: No longer required.   Mammogram status: No longer required due to age.  Bone Density status: Ordered 03/07/2021. Pt provided with contact info and advised to call to schedule appt.  Lung Cancer Screening: (Low Dose  CT Chest recommended if Age 82-80 years, 30 pack-year currently smoking OR have quit w/in 15years.) does not qualify.   Lung Cancer Screening Referral: n/a  Additional Screening:  Hepatitis C Screening: does qualify;   Vision Screening: Recommended annual ophthalmology exams for early detection of glaucoma and other disorders of the eye. Is the patient up to date with their annual eye exam?  Yes  Who is the provider or what is the name of the office in which the patient attends annual eye exams? Dr.Tanner  If pt is not established with a provider, would they like to be referred to a provider to establish care? No .   Dental Screening: Recommended annual dental exams for proper oral hygiene  Community Resource Referral / Chronic Care Management: CRR required this visit?  No   CCM required this visit?  No      Plan:     I have personally reviewed and noted the following in the patients chart:   Medical and social history Use of alcohol, tobacco or illicit drugs  Current medications and supplements including opioid prescriptions.  Functional ability and status Nutritional status Physical activity Advanced directives List of other physicians Hospitalizations, surgeries, and ER visits in previous 12 months Vitals Screenings to include cognitive, depression, and falls Referrals and appointments  In addition, I have reviewed and discussed with patient certain preventive protocols, quality metrics, and best practice recommendations. A written personalized care plan for preventive services as well as general preventive health recommendations were provided to patient.     Randel Pigg, LPN   08/10/735   Nurse Notes: none

## 2021-03-11 ENCOUNTER — Ambulatory Visit (INDEPENDENT_AMBULATORY_CARE_PROVIDER_SITE_OTHER): Payer: Medicare Other | Admitting: Registered Nurse

## 2021-03-11 ENCOUNTER — Encounter: Payer: Self-pay | Admitting: Registered Nurse

## 2021-03-11 VITALS — BP 142/80 | HR 94 | Temp 98.0°F | Resp 16 | Wt 154.4 lb

## 2021-03-11 DIAGNOSIS — J22 Unspecified acute lower respiratory infection: Secondary | ICD-10-CM | POA: Diagnosis not present

## 2021-03-11 MED ORDER — HYDROCODONE BIT-HOMATROP MBR 5-1.5 MG/5ML PO SOLN: 5.0000 mL | Freq: Three times a day (TID) | ORAL | 0 refills | Status: DC | PRN

## 2021-03-11 MED ORDER — LEVOFLOXACIN 750 MG PO TABS: 750.0000 mg | ORAL_TABLET | Freq: Every day | ORAL | 0 refills | Status: DC

## 2021-03-11 NOTE — Progress Notes (Signed)
Established Patient Office Visit  Subjective:  Patient ID: Denise Ewing, female    DOB: 1945-10-06  Age: 76 y.o. MRN: [id number]  CC:  Chief Complaint  Patient presents with   Cough   Nasal Congestion    HPI Denise Ewing presents for cough and nasal congestion  Onset 2 weeks ago Seen via VV with Cherly [place] NP who suspected bronchitis Given tessalon and encouraged to use mucinex. Has been doing both, no resolution.  Feels chest congestion at this point. Trouble sleeping due to cough.   Occ diarrhea. Occ shob, mostly after coughing.   No nv, fever.  Very productive with yellow, grey green mucus.   Past Medical History:  Diagnosis Date   Allergy    post nasal drip   Arthritis    Blood transfusion without reported diagnosis    with hysterectomy 11 years ago   GERD (gastroesophageal reflux disease)    Glaucoma    bilateral eyes   History of colonic polyps    Melanoma of thigh ([place])    Osteoporosis     Past Surgical History:  Procedure Laterality Date   Colon polyps removed     Melanoma removed     R thigh   TOTAL ABDOMINAL HYSTERECTOMY W/ BILATERAL SALPINGOOPHORECTOMY     uterine fibroids    Family History  Problem Relation Age of Onset   Hypertension Mother    Cancer Mother        breast,anal   Osteoporosis Mother    Breast cancer Mother 37   Rectal cancer Mother    CAD Sister    Arrhythmia Sister    Hypothyroidism Sister    Arrhythmia Brother    Colon polyps Neg Hx    Esophageal cancer Neg Hx    Stomach cancer Neg Hx     Social History   Socioeconomic History   Marital status: Married    Spouse name: Not on file   Number of children: Not on file   Years of education: Not on file   Highest education level: Not on file  Occupational History   Not on file  Tobacco Use   Smoking status: Never   Smokeless tobacco: Never  Vaping Use   Vaping Use: Never used  Substance and Sexual Activity   Alcohol use: Yes    Comment:  occasionally    Drug use: No   Sexual activity: Not on file  Other Topics Concern   Not on file  Social History Narrative   Not on file   Social Determinants of Health   Financial Resource Strain: Low Risk    Difficulty of Paying Living Expenses: Not hard at all  Food Insecurity: No Food Insecurity   Worried About Charity fundraiser in the Last Year: Never true   Ran Out of Food in the Last Year: Never true  Transportation Needs: No Transportation Needs   Lack of Transportation (Medical): No   Lack of Transportation (Non-Medical): No  Physical Activity: Insufficiently Active   Days of Exercise per Week: 3 days   Minutes of Exercise per Session: 30 min  Stress: No Stress Concern Present   Feeling of Stress : Not at all  Social Connections: Moderately Integrated   Frequency of Communication with Friends and Family: Twice a week   Frequency of Social Gatherings with Friends and Family: Twice a week   Attends Religious Services: 1 to 4 times per year   Active Member of Clubs or Organizations: No  Attends Archivist Meetings: Never   Marital Status: Married  Human resources officer Violence: Not At Risk   Fear of Current or Ex-Partner: No   Emotionally Abused: No   Physically Abused: No   Sexually Abused: No    Outpatient Medications Prior to Visit  Medication Sig Dispense Refill   aspirin 81 MG tablet Take 81 mg by mouth daily.     atorvastatin (LIPITOR) 10 MG tablet TAKE 1 TABLET BY MOUTH EVERY DAY 90 tablet 0   benzonatate (TESSALON) 100 MG capsule Take 1 capsule (100 mg total) by mouth 2 (two) times daily as needed for cough. 30 capsule 0   Calcium Citrate-Vitamin D 250-200 MG-UNIT TABS Take 2 tablets by mouth at bedtime.     Coenzyme Q10 (CO Q-10 PO) Take 1 capsule by mouth every morning.     conjugated estrogens (PREMARIN) vaginal cream Place 1.5 g vaginally once a week. Take one tablet by mouth on Sundays per patient     dorzolamide-timolol (COSOPT) 22.3-6.8 MG/ML ophthalmic  solution 1 drop 2 (two) times daily.     doxycycline (VIBRA-TABS) 100 MG tablet Take 1 tablet (100 mg total) by mouth 2 (two) times daily. 20 tablet 0   ezetimibe (ZETIA) 10 MG tablet TAKE 1/2 TABLET BY MOUTH EVERY DAY 45 tablet 1   famotidine (PEPCID) 20 MG tablet Take one tablet after dinner 30 tablet 3   GLUCOSAMINE CHONDROITIN COMPLX PO Take 2 tablets by mouth daily.      Influenza Vac High-Dose Quad (FLUZONE HIGH-DOSE QUADRIVALENT) 0.7 ML SUSY Fluzone High-Dose Quad 2020-21 (PF) 240 mcg/0.7 mL IM syringe  PHARMACIST ADMINISTERED IMMUNIZATION ADMINISTERED AT TIME OF DISPENSING     meloxicam (MOBIC) 7.5 MG tablet TAKE 1 to 2 TABLETS BY MOUTH DAILY as needed for pain 60 tablet 3   metoprolol succinate (TOPROL XL) 25 MG 24 hr tablet Take 0.5 (12.5 mg)_to 1 tablet (25 mg) daily 90 tablet 3   Multiple Vitamins-Minerals (ALIVE WOMENS 50+ PO) Take 1 tablet by mouth every morning.     OVER THE COUNTER MEDICATION Take 1 tablet by mouth daily. Tumeric and cumin     pantoprazole (PROTONIX) 40 MG tablet TAKE 1 TABLET BY MOUTH 2 TIMES DAILY BEFORE A MEAL 180 tablet 2   promethazine-dextromethorphan (PROMETHAZINE-DM) 6.25-15 MG/5ML syrup Take 5 mLs by mouth 4 (four) times daily as needed for cough. 118 mL 0   raloxifene (EVISTA) 60 MG tablet TAKE 1 TABLET BY MOUTH EVERY DAY 90 tablet 0   TRAVATAN Z 0.004 % SOLN ophthalmic solution Place 1 drop into both eyes at bedtime.   1   ondansetron (ZOFRAN) 4 MG tablet Take 1 tablet (4 mg total) by mouth every 8 (eight) hours as needed for nausea or vomiting. 20 tablet 0   timolol (TIMOPTIC) 0.5 % ophthalmic solution 1 drop 2 (two) times daily.     No facility-administered medications prior to visit.    Allergies  Allergen Reactions   Amoxicillin Hives    Did it involve swelling of the face/tongue/throat, SOB, or low BP? No Did it involve sudden or severe rash/hives, skin peeling, or any reaction on the inside of your mouth or nose? Yes Did you need to seek  medical attention at a hospital or doctor's office? No When did it last happen?   >83yrs ago     If all above answers are NO, may proceed with cephalosporin use.   Albuterol Other (See Comments)    ROS Review of Systems Per  hpi     Objective:    Physical Exam Vitals and nursing note reviewed.  Constitutional:      General: She is not in acute distress.    Appearance: Normal appearance. She is normal weight. She is not ill-appearing, toxic-appearing or diaphoretic.  Cardiovascular:     Rate and Rhythm: Normal rate and regular rhythm.     Heart sounds: Normal heart sounds. No murmur heard.   No friction rub. No gallop.  Pulmonary:     Effort: Pulmonary effort is normal. No respiratory distress.     Breath sounds: No stridor. Wheezing present. No rhonchi or rales.  Chest:     Chest wall: No tenderness.  Skin:    General: Skin is warm and dry.  Neurological:     General: No focal deficit present.     Mental Status: She is alert and oriented to person, place, and time. Mental status is at baseline.  Psychiatric:        Mood and Affect: Mood normal.        Behavior: Behavior normal.        Thought Content: Thought content normal.        Judgment: Judgment normal.    BP (!) 142/80    Pulse 94    Temp 98 F (36.7 C)    Resp 16    Wt 154 lb 6.4 oz (70 kg)    SpO2 98%    BMI 28.70 kg/m  Wt Readings from Last 3 Encounters:  03/11/21 154 lb 6.4 oz (70 kg)  01/30/21 158 lb 9.6 oz (71.9 kg)  12/20/20 157 lb 12.8 oz (71.6 kg)     Health Maintenance Due  Topic Date Due   DEXA SCAN  03/02/2021    There are no preventive care reminders to display for this patient.  Lab Results  Component Value Date   TSH 1.87 12/20/2020   Lab Results  Component Value Date   WBC 6.9 12/20/2020   HGB 13.1 12/20/2020   HCT 39.5 12/20/2020   MCV 94.7 12/20/2020   PLT 245.0 12/20/2020   Lab Results  Component Value Date   NA 138 12/20/2020   K 4.1 12/20/2020   CO2 26 12/20/2020    GLUCOSE 91 12/20/2020   BUN 15 12/20/2020   CREATININE 0.86 12/20/2020   BILITOT 0.7 12/20/2020   ALKPHOS 51 12/20/2020   AST 29 12/20/2020   ALT 18 12/20/2020   PROT 6.6 12/20/2020   ALBUMIN 4.4 12/20/2020   CALCIUM 9.4 12/20/2020   ANIONGAP 10 12/16/2018   GFR 65.80 12/20/2020   Lab Results  Component Value Date   CHOL 135 12/20/2020   Lab Results  Component Value Date   HDL 57.90 12/20/2020   Lab Results  Component Value Date   LDLCALC 61 12/20/2020   Lab Results  Component Value Date   TRIG 82.0 12/20/2020   Lab Results  Component Value Date   CHOLHDL 2 12/20/2020   No results found for: HGBA1C    Assessment & Plan:   Problem List Items Addressed This Visit   None Visit Diagnoses     Lower respiratory infection    -  Primary   Relevant Medications   levofloxacin (LEVAQUIN) 750 MG tablet   HYDROcodone bit-homatropine (HYCODAN) 5-1.5 MG/5ML syrup   Other Relevant Orders   DG Chest 2 View       Meds ordered this encounter  Medications   levofloxacin (LEVAQUIN) 750 MG tablet    Sig:  Take 1 tablet (750 mg total) by mouth daily.    Dispense:  7 tablet    Refill:  0    Order Specific Question:   Supervising Provider    Answer:   Carlota Raspberry, JEFFREY R [[id number]]   HYDROcodone bit-homatropine (HYCODAN) 5-1.5 MG/5ML syrup    Sig: Take 5 mLs by mouth every 8 (eight) hours as needed for cough.    Dispense:  120 mL    Refill:  0    Order Specific Question:   Supervising Provider    Answer:   Carlota Raspberry, JEFFREY R [[id number]]    Follow-up: Return if symptoms worsen or fail to improve.   PLAN Levaquin 750mg  po qd x 1 week Hycodan - discussed risks of this with patient who voices understanding Continue tessalon, mucinex. Rest and hydrate Return if worsening or failing to improve Patient encouraged to call clinic with any questions, comments, or concerns.  [name], NP

## 2021-03-11 NOTE — Patient Instructions (Signed)
Denise Ewing -   Denise Ewing to see you, sorry it's under these circumstances.  Take levaquin as prescribed. Ok to use the cough syrup but use with caution as it is sedative  Xray ordered to [place] at [facility]. They take walk ins.   Call if worsening or failing to improve  Thank you  Rich

## 2021-03-12 ENCOUNTER — Encounter: Payer: Self-pay | Admitting: Registered Nurse

## 2021-03-12 ENCOUNTER — Ambulatory Visit
Admission: RE | Admit: 2021-03-12 | Discharge: 2021-03-12 | Disposition: A | Discharge status: Home / Self Care | Payer: Medicare Other | Source: Ambulatory Visit | Attending: Registered Nurse | Admitting: Registered Nurse

## 2021-03-12 ENCOUNTER — Telehealth: Payer: Self-pay

## 2021-03-12 ENCOUNTER — Other Ambulatory Visit: Payer: Self-pay

## 2021-03-12 DIAGNOSIS — R059 Cough, unspecified: Secondary | ICD-10-CM | POA: Diagnosis not present

## 2021-03-12 DIAGNOSIS — J069 Acute upper respiratory infection, unspecified: Secondary | ICD-10-CM

## 2021-03-12 DIAGNOSIS — J22 Unspecified acute lower respiratory infection: Secondary | ICD-10-CM

## 2021-03-12 NOTE — Telephone Encounter (Signed)
Order printed and faxed.

## 2021-03-12 NOTE — Telephone Encounter (Signed)
Caller name:Lauren [facility])  On DPR? :Yes  Call back [place] Fax [phone]  Provider they see: Birdie Riddle   Reason for call: Lauren at [facility] said they never got the order for the bone density test and needs this sent over

## 2021-03-14 ENCOUNTER — Telehealth: Payer: Self-pay | Admitting: Registered Nurse

## 2021-03-14 NOTE — Telephone Encounter (Signed)
Pt reports new/worsening diarrhea, please advise direction

## 2021-03-14 NOTE — Telephone Encounter (Signed)
Pt called in stating that she saw Rich on Monday. She states that she is now having yellow watery diarrhea. She wanted to know if something could be sent in for her, please advise

## 2021-03-15 ENCOUNTER — Other Ambulatory Visit: Payer: Self-pay | Admitting: Registered Nurse

## 2021-03-15 DIAGNOSIS — J069 Acute upper respiratory infection, unspecified: Secondary | ICD-10-CM

## 2021-03-15 MED ORDER — BENZONATATE 100 MG PO CAPS: 100.0000 mg | ORAL_CAPSULE | Freq: Three times a day (TID) | ORAL | 0 refills | Status: DC | PRN

## 2021-03-15 NOTE — Telephone Encounter (Signed)
Sent refill   Thanks,  Denice Paradise

## 2021-03-15 NOTE — Telephone Encounter (Signed)
Called and informed pt.  

## 2021-03-15 NOTE — Telephone Encounter (Signed)
Ok to use OTC products. Not unexpected with abx use. Recommend bland foods, probiotics, prebiotics.  Thanks,  Denice Paradise

## 2021-03-15 NOTE — Telephone Encounter (Signed)
Pt reports took 1 dose of Abx and stopped due to bad side effects did not want to discuss this was upset Dr Birdie Riddle was unavailable and that she did not get a response to her message sooner, I informed her we are allotted 48 hours to respond to messages and she has requested refill tessalon pearls for cough as those do help

## 2021-03-22 ENCOUNTER — Encounter: Payer: Self-pay | Admitting: Family Medicine

## 2021-03-22 DIAGNOSIS — M8589 Other specified disorders of bone density and structure, multiple sites: Secondary | ICD-10-CM | POA: Diagnosis not present

## 2021-03-22 DIAGNOSIS — Z79890 Hormone replacement therapy (postmenopausal): Secondary | ICD-10-CM | POA: Diagnosis not present

## 2021-03-22 DIAGNOSIS — Z780 Asymptomatic menopausal state: Secondary | ICD-10-CM | POA: Diagnosis not present

## 2021-03-22 DIAGNOSIS — Z1231 Encounter for screening mammogram for malignant neoplasm of breast: Secondary | ICD-10-CM | POA: Diagnosis not present

## 2021-03-22 LAB — HM MAMMOGRAPHY

## 2021-03-25 ENCOUNTER — Encounter: Payer: Self-pay | Admitting: Family Medicine

## 2021-03-27 ENCOUNTER — Telehealth: Payer: Self-pay | Admitting: Pharmacist

## 2021-03-27 NOTE — Progress Notes (Signed)
Chronic Care Management Pharmacy Assistant   Name: Zakhia Seres  MRN: [id number] DOB: 05-07-1945   Reason for Encounter: Disease State - General Adherence Call    Recent office visits:  03/11/20 [name], NP - Family Medicine - Lower respiratory infection - Chest xray ordered. HYDROcodone bit-homatropine (HYCODAN) 5-1.5 MG/5ML syrup and levofloxacin (LEVAQUIN) 750 MG tablet prescribed. Continue tessalon, mucinex. Rest and hydrate. Follow up if no improvement.   02/28/21 Annual Medicare Wellness Completed  02/28/21 Cherly Beach, NP - Family Medicine - Viral URI with cough - benzonatate (TESSALON) 100 MG capsule; Take 1 capsule (100 mg total) by mouth 2 (two) times daily as needed for cough prescribed. Follow up if no improvement.   02/01/21 Edwrd Saguier, PA-C - Family Medicine - Tooth pain - doxycycline (VIBRA-TABS) 100 MG tablet prescribed. Follow up if needed.    Recent consult visits:  01/30/21 Dorris Carnes, MD - Cardiology - Palpitations - EKG performed. No medication changes. Follow up in 1 year.    Hospital visits:  None in previous 6 months  Medications: Outpatient Encounter Medications as of 03/27/2021  Medication Sig   aspirin 81 MG tablet Take 81 mg by mouth daily.   atorvastatin (LIPITOR) 10 MG tablet TAKE 1 TABLET BY MOUTH EVERY DAY   benzonatate (TESSALON) 100 MG capsule Take 1 capsule (100 mg total) by mouth 3 (three) times daily as needed for cough.   Calcium Citrate-Vitamin D 250-200 MG-UNIT TABS Take 2 tablets by mouth at bedtime.   Coenzyme Q10 (CO Q-10 PO) Take 1 capsule by mouth every morning.   conjugated estrogens (PREMARIN) vaginal cream Place 1.5 g vaginally once a week. Take one tablet by mouth on Sundays per patient   dorzolamide-timolol (COSOPT) 22.3-6.8 MG/ML ophthalmic solution 1 drop 2 (two) times daily.   doxycycline (VIBRA-TABS) 100 MG tablet Take 1 tablet (100 mg total) by mouth 2 (two) times daily.   ezetimibe (ZETIA) 10 MG tablet TAKE 1/2 TABLET  BY MOUTH EVERY DAY   famotidine (PEPCID) 20 MG tablet Take one tablet after dinner   GLUCOSAMINE CHONDROITIN COMPLX PO Take 2 tablets by mouth daily.    HYDROcodone bit-homatropine (HYCODAN) 5-1.5 MG/5ML syrup Take 5 mLs by mouth every 8 (eight) hours as needed for cough.   Influenza Vac High-Dose Quad (FLUZONE HIGH-DOSE QUADRIVALENT) 0.7 ML SUSY Fluzone High-Dose Quad 2020-21 (PF) 240 mcg/0.7 mL IM syringe  PHARMACIST ADMINISTERED IMMUNIZATION ADMINISTERED AT TIME OF DISPENSING   levofloxacin (LEVAQUIN) 750 MG tablet Take 1 tablet (750 mg total) by mouth daily.   meloxicam (MOBIC) 7.5 MG tablet TAKE 1 to 2 TABLETS BY MOUTH DAILY as needed for pain   metoprolol succinate (TOPROL XL) 25 MG 24 hr tablet Take 0.5 (12.5 mg)_to 1 tablet (25 mg) daily   Multiple Vitamins-Minerals (ALIVE WOMENS 50+ PO) Take 1 tablet by mouth every morning.   OVER THE COUNTER MEDICATION Take 1 tablet by mouth daily. Tumeric and cumin   pantoprazole (PROTONIX) 40 MG tablet TAKE 1 TABLET BY MOUTH 2 TIMES DAILY BEFORE A MEAL   promethazine-dextromethorphan (PROMETHAZINE-DM) 6.25-15 MG/5ML syrup Take 5 mLs by mouth 4 (four) times daily as needed for cough.   raloxifene (EVISTA) 60 MG tablet TAKE 1 TABLET BY MOUTH EVERY DAY   TRAVATAN Z 0.004 % SOLN ophthalmic solution Place 1 drop into both eyes at bedtime.    No facility-administered encounter medications on file as of 03/27/2021.    Have you had any problems recently with your health? Patient reported she  is having some tests done soon and had an abnormal mammogram and having an ultrasound and a diagnostic mammogram.   Have you had any problems with your pharmacy? Patient denied any problems with her current pharmacy.   What issues or side effects are you having with your medications? Patient reported she stopped taking Levaquin due to having side effects from it. She reported she could not handle the side effects. She reported she was having hallucinations from it.    What would you like me to pass along to Leata Mouse, CPP for them to help you with?  Patient did not have anything to pass along at this time.   What can we do to take care of you better? Patient did not have any recommendations at this time and thanked me for the call and follow up.   Care Gaps   AWV: done 02/27/20 Colonoscopy: due 06/18/21 DM Eye Exam: N/A DM Foot Exam: N/A Microalbumin: N/A HbgAIC: N/A DEXA: due 03/02/21 Mammogram: done 03/20/21 (having diagnostic and [place] tomorrow)   Star Rating Drugs: atorvastatin (LIPITOR) 10 MG tablet - last filled 02/26/21 90 days   No future appointments.   Jobe Gibbon, [facility] Clinical Pharmacist Assistant  [phone]

## 2021-03-28 ENCOUNTER — Encounter: Payer: Self-pay | Admitting: Family Medicine

## 2021-03-28 DIAGNOSIS — R922 Inconclusive mammogram: Secondary | ICD-10-CM | POA: Diagnosis not present

## 2021-03-28 DIAGNOSIS — R928 Other abnormal and inconclusive findings on diagnostic imaging of breast: Secondary | ICD-10-CM | POA: Diagnosis not present

## 2021-04-03 DIAGNOSIS — Z85820 Personal history of malignant melanoma of skin: Secondary | ICD-10-CM | POA: Diagnosis not present

## 2021-04-03 DIAGNOSIS — L820 Inflamed seborrheic keratosis: Secondary | ICD-10-CM | POA: Diagnosis not present

## 2021-04-03 DIAGNOSIS — Z129 Encounter for screening for malignant neoplasm, site unspecified: Secondary | ICD-10-CM | POA: Diagnosis not present

## 2021-04-03 DIAGNOSIS — D485 Neoplasm of uncertain behavior of skin: Secondary | ICD-10-CM | POA: Diagnosis not present

## 2021-04-12 DIAGNOSIS — Z01419 Encounter for gynecological examination (general) (routine) without abnormal findings: Secondary | ICD-10-CM | POA: Diagnosis not present

## 2021-04-12 DIAGNOSIS — Z6829 Body mass index (BMI) 29.0-29.9, adult: Secondary | ICD-10-CM | POA: Diagnosis not present

## 2021-04-12 DIAGNOSIS — N952 Postmenopausal atrophic vaginitis: Secondary | ICD-10-CM | POA: Diagnosis not present

## 2021-04-25 ENCOUNTER — Other Ambulatory Visit: Payer: Self-pay | Admitting: Internal Medicine

## 2021-04-25 ENCOUNTER — Other Ambulatory Visit: Payer: Self-pay | Admitting: Gastroenterology

## 2021-04-25 DIAGNOSIS — M1612 Unilateral primary osteoarthritis, left hip: Secondary | ICD-10-CM | POA: Diagnosis not present

## 2021-04-25 DIAGNOSIS — M25552 Pain in left hip: Secondary | ICD-10-CM | POA: Diagnosis not present

## 2021-04-26 ENCOUNTER — Telehealth: Payer: Self-pay

## 2021-04-26 NOTE — Telephone Encounter (Signed)
Spoke to patient she is aware of the results ?

## 2021-04-26 NOTE — Telephone Encounter (Signed)
-----   Message from [name], MD sent at 04/26/2021  7:28 AM EST ----- ?Your bone density is actually improving on the Raloxifene!  You have gone from osteoporosis to osteopenia!  Keep up the good work! ?

## 2021-05-03 DIAGNOSIS — M47816 Spondylosis without myelopathy or radiculopathy, lumbar region: Secondary | ICD-10-CM | POA: Diagnosis not present

## 2021-05-10 ENCOUNTER — Other Ambulatory Visit: Payer: Self-pay | Admitting: Family Medicine

## 2021-05-30 ENCOUNTER — Ambulatory Visit: Payer: Medicare Other | Admitting: Internal Medicine

## 2021-06-05 ENCOUNTER — Other Ambulatory Visit: Payer: Self-pay | Admitting: Gastroenterology

## 2021-06-11 DIAGNOSIS — M47816 Spondylosis without myelopathy or radiculopathy, lumbar region: Secondary | ICD-10-CM | POA: Diagnosis not present

## 2021-06-14 DIAGNOSIS — H401111 Primary open-angle glaucoma, right eye, mild stage: Secondary | ICD-10-CM | POA: Diagnosis not present

## 2021-06-14 DIAGNOSIS — H2513 Age-related nuclear cataract, bilateral: Secondary | ICD-10-CM | POA: Diagnosis not present

## 2021-06-14 DIAGNOSIS — H401123 Primary open-angle glaucoma, left eye, severe stage: Secondary | ICD-10-CM | POA: Diagnosis not present

## 2021-06-14 DIAGNOSIS — H04123 Dry eye syndrome of bilateral lacrimal glands: Secondary | ICD-10-CM | POA: Diagnosis not present

## 2021-07-03 DIAGNOSIS — H18461 Peripheral corneal degeneration, right eye: Secondary | ICD-10-CM | POA: Diagnosis not present

## 2021-07-03 DIAGNOSIS — H04123 Dry eye syndrome of bilateral lacrimal glands: Secondary | ICD-10-CM | POA: Diagnosis not present

## 2021-07-25 DIAGNOSIS — M47816 Spondylosis without myelopathy or radiculopathy, lumbar region: Secondary | ICD-10-CM | POA: Diagnosis not present

## 2021-08-08 ENCOUNTER — Other Ambulatory Visit: Payer: Self-pay | Admitting: Family Medicine

## 2021-08-09 ENCOUNTER — Other Ambulatory Visit: Payer: Self-pay | Admitting: Family Medicine

## 2021-08-15 ENCOUNTER — Other Ambulatory Visit: Payer: Self-pay | Admitting: Gastroenterology

## 2021-09-09 DIAGNOSIS — M47816 Spondylosis without myelopathy or radiculopathy, lumbar region: Secondary | ICD-10-CM | POA: Diagnosis not present

## 2021-10-21 ENCOUNTER — Ambulatory Visit: Payer: Medicare Other

## 2021-10-31 DIAGNOSIS — M47816 Spondylosis without myelopathy or radiculopathy, lumbar region: Secondary | ICD-10-CM | POA: Diagnosis not present

## 2021-10-31 DIAGNOSIS — M1611 Unilateral primary osteoarthritis, right hip: Secondary | ICD-10-CM | POA: Diagnosis not present

## 2021-10-31 DIAGNOSIS — M5136 Other intervertebral disc degeneration, lumbar region: Secondary | ICD-10-CM | POA: Diagnosis not present

## 2021-10-31 DIAGNOSIS — M25551 Pain in right hip: Secondary | ICD-10-CM | POA: Diagnosis not present

## 2021-11-06 DIAGNOSIS — H401123 Primary open-angle glaucoma, left eye, severe stage: Secondary | ICD-10-CM | POA: Diagnosis not present

## 2021-11-08 ENCOUNTER — Encounter: Payer: Self-pay | Admitting: Family Medicine

## 2021-11-11 ENCOUNTER — Other Ambulatory Visit: Payer: Self-pay | Admitting: Family Medicine

## 2021-11-11 ENCOUNTER — Other Ambulatory Visit: Payer: Self-pay | Admitting: Gastroenterology

## 2021-11-11 DIAGNOSIS — M1611 Unilateral primary osteoarthritis, right hip: Secondary | ICD-10-CM | POA: Diagnosis not present

## 2021-11-11 DIAGNOSIS — M25551 Pain in right hip: Secondary | ICD-10-CM | POA: Diagnosis not present

## 2021-11-11 NOTE — Telephone Encounter (Signed)
Dr Carlean Purl,   You are DOD for the am.  This is a patient of Dr Ardis Hughs.  Patient has not been evaluated since December 2020.  Please advise refill.   Thank you, Elmyra Ricks

## 2021-11-11 NOTE — Telephone Encounter (Signed)
Left message for patient to return call to schedule a follow up appointment for future refills.

## 2021-11-11 NOTE — Telephone Encounter (Signed)
She needs to schedule a follow-up  If she does so we could then refill until that time

## 2021-11-13 ENCOUNTER — Encounter: Payer: Self-pay | Admitting: Gastroenterology

## 2021-11-21 ENCOUNTER — Ambulatory Visit: Payer: Medicare Other

## 2021-11-21 ENCOUNTER — Other Ambulatory Visit: Payer: Self-pay | Admitting: Internal Medicine

## 2021-11-25 ENCOUNTER — Encounter: Payer: Self-pay | Admitting: Internal Medicine

## 2021-11-26 ENCOUNTER — Encounter: Payer: Self-pay | Admitting: Physician Assistant

## 2021-11-26 ENCOUNTER — Ambulatory Visit: Payer: Medicare Other | Attending: Physician Assistant | Admitting: Physician Assistant

## 2021-11-26 ENCOUNTER — Ambulatory Visit: Payer: Medicare Other | Attending: Physician Assistant

## 2021-11-26 VITALS — BP 110/64 | HR 65 | Ht 61.0 in | Wt 157.0 lb

## 2021-11-26 DIAGNOSIS — R0602 Shortness of breath: Secondary | ICD-10-CM

## 2021-11-26 DIAGNOSIS — R600 Localized edema: Secondary | ICD-10-CM

## 2021-11-26 DIAGNOSIS — R002 Palpitations: Secondary | ICD-10-CM | POA: Diagnosis not present

## 2021-11-26 DIAGNOSIS — I471 Supraventricular tachycardia, unspecified: Secondary | ICD-10-CM | POA: Diagnosis not present

## 2021-11-26 DIAGNOSIS — I2510 Atherosclerotic heart disease of native coronary artery without angina pectoris: Secondary | ICD-10-CM

## 2021-11-26 MED ORDER — FUROSEMIDE 20 MG PO TABS: 20.0000 mg | ORAL_TABLET | Freq: Every day | ORAL | 3 refills | Status: DC | PRN

## 2021-11-26 NOTE — Patient Instructions (Signed)
Medication Instructions:  START Lasix '20mg'$  Take 1 tablet daily for two days then take on a as needed basis *If you need a refill on your cardiac medications before your next appointment, please call your pharmacy*   Lab Work: TODAY-BMET, BNP, TSH If you have labs (blood work) drawn today and your tests are completely normal, you will receive your results only by: [place] (if you have MyChart) OR A paper copy in the mail If you have any lab test that is abnormal or we need to change your treatment, we will call you to review the results.   Testing/Procedures: Your physician has requested that you have an echocardiogram. Echocardiography is a painless test that uses sound waves to create images of your heart. It provides your doctor with information about the size and shape of your heart and how well your heart's chambers and valves are working. This procedure takes approximately one hour. There are no restrictions for this procedure.  ZIO XT- Long Term Monitor Instructions  Your physician has requested you wear a ZIO patch monitor for 14 days.  This is a single patch monitor. [employer] supplies one patch monitor per enrollment. Additional stickers are not available. Please do not apply patch if you will be having a Nuclear Stress Test,  Echocardiogram, Cardiac CT, MRI, or Chest Xray during the period you would be wearing the  monitor. The patch cannot be worn during these tests. You cannot remove and re-apply the  ZIO XT patch monitor.  Your ZIO patch monitor will be mailed 3 day USPS to your address on file. It may take 3-5 days  to receive your monitor after you have been enrolled.  Once you have received your monitor, please review the enclosed instructions. Your monitor  has already been registered assigning a specific monitor serial # to you.  Billing and Patient Assistance Program Information  We have supplied [employer] with any of your insurance information on file for  billing purposes. [employer] offers a sliding scale Patient Assistance Program for patients that do not have  insurance, or whose insurance does not completely cover the cost of the ZIO monitor.  You must apply for the Patient Assistance Program to qualify for this discounted rate.  To apply, please call [employer] at [phone], select option 4, select option 2, ask to apply for  Patient Assistance Program. Denise Ewing will ask your household income, and how many people  are in your household. They will quote your out-of-pocket cost based on that information.  [employer] will also be able to set up a 40-month interest-free payment plan if needed.  Applying the monitor   Shave hair from upper left chest.  Hold abrader disc by orange tab. Rub abrader in 40 strokes over the upper left chest as  indicated in your monitor instructions.  Clean area with 4 enclosed alcohol pads. Let dry.  Apply patch as indicated in monitor instructions. Patch will be placed under collarbone on left  side of chest with arrow pointing upward.  Rub patch adhesive wings for 2 minutes. Remove white label marked "1". Remove the white  label marked "2". Rub patch adhesive wings for 2 additional minutes.  While looking in a mirror, press and release button in center of patch. A small green light will  flash 3-4 times. This will be your only indicator that the monitor has been turned on.  Do not shower for the first 24 hours. You may shower after the first 24 hours.  Press the  button if you feel a symptom. You will hear a small click. Record Date, Time and  Symptom in the Patient Logbook.  When you are ready to remove the patch, follow instructions on the last 2 pages of Patient  Logbook. Stick patch monitor onto the last page of Patient Logbook.  Place Patient Logbook in the blue and white box. Use locking tab on box and tape box closed  securely. The blue and white box has prepaid postage on it. Please place it in the mailbox  as  soon as possible. Your physician should have your test results approximately 7 days after the  monitor has been mailed back to [facility].  Call [facility] at [phone] if you have questions regarding  your ZIO XT patch monitor. Call them immediately if you see an orange light blinking on your  monitor.  If your monitor falls off in less than 4 days, contact our Monitor department at [phone].  If your monitor becomes loose or falls off after 4 days call [employer] at [phone] for  suggestions on securing your monitor  Follow-Up: At [facility], you and your health needs are our priority.  As part of our continuing mission to provide you with exceptional heart care, we have created designated Provider Care Teams.  These Care Teams include your primary Cardiologist (physician) and Advanced Practice Providers (APPs -  Physician Assistants and Nurse Practitioners) who all work together to provide you with the care you need, when you need it.  We recommend signing up for the patient portal called "MyChart".  Sign up information is provided on this After Visit Summary.  MyChart is used to connect with patients for Virtual Visits (Telemedicine).  Patients are able to view lab/test results, encounter notes, upcoming appointments, etc.  Non-urgent messages can be sent to your provider as well.   To learn more about what you can do with MyChart, go to [email or web address].    Your next appointment:   6 week(s)  The format for your next appointment:   In Person  Provider:   Robbie Lis, PA-C      Other Instructions   Important Information About Sugar

## 2021-11-26 NOTE — Progress Notes (Signed)
Cardiology Office Note:    Date:  11/26/2021   ID:  Denise Ewing, DOB [birth year], MRN [id number]  PCP:  [name], MD  [facility] Cardiologist:  Dorris Carnes, MD  [place] Electrophysiologist:  None   Chief Complaint: Leg swelling, fluttering sensation and sob   History of Present Illness:    Denise Ewing is a 76 y.o. female with a hx of nonobstructive mild CAD with chronic atypical chest pain, palpitation, and GERD admitted for multiple complaints.  Coronary CTA November 2015 with calcium score of 126, less than 50% stenosis in proximal LAD. Echo 12/2013: normal LVEF Monitor December 2021 showed sinus rhythm with occasional PAC and short runs of SVT.   Patient is here for follow-up after episode yesterday.  She reports intermittent palpitation occurring 2-3 times per week.  Each episode lasting for less than 5 seconds.  Occasionally associated with stress.  Yesterday while working she stressed out and had a worse episode of palpitation with severe shortness of breath and dizziness.  Almost passed out but no loss of consciousness.  She also reporting lower extremity swelling for past few months.  No orthopnea or PND.  Denies excess salt intake.  No syncope or shortness of breath.  Uses low-sodium diet.  She is not taking her Toprol-XL.   Past Medical History:  Diagnosis Date   Allergy    post nasal drip   Arthritis    Blood transfusion without reported diagnosis    with hysterectomy 11 years ago   GERD (gastroesophageal reflux disease)    Glaucoma    bilateral eyes   History of colonic polyps    Melanoma of thigh ([place])    Osteoporosis     Past Surgical History:  Procedure Laterality Date   Colon polyps removed     Melanoma removed     R thigh   TOTAL ABDOMINAL HYSTERECTOMY W/ BILATERAL SALPINGOOPHORECTOMY     uterine fibroids    Current Medications: Current Meds  Medication Sig   aspirin 81 MG tablet Take 81 mg by mouth daily.   atorvastatin  (LIPITOR) 10 MG tablet Take 1 tablet (10 mg total) by mouth daily.   benzonatate (TESSALON) 100 MG capsule Take 1 capsule (100 mg total) by mouth 3 (three) times daily as needed for cough.   Calcium Citrate-Vitamin D 250-200 MG-UNIT TABS Take 2 tablets by mouth at bedtime.   Coenzyme Q10 (CO Q-10 PO) Take 1 capsule by mouth every morning.   conjugated estrogens (PREMARIN) vaginal cream Place 1.5 g vaginally once a week. Take one tablet by mouth on Sundays per patient   dorzolamide-timolol (COSOPT) 22.3-6.8 MG/ML ophthalmic solution 1 drop 2 (two) times daily.   ezetimibe (ZETIA) 10 MG tablet TAKE 1/2 TABLET BY MOUTH EVERY DAY   famotidine (PEPCID) 20 MG tablet Take one tablet after dinner   furosemide (LASIX) 20 MG tablet Take 1 tablet (20 mg total) by mouth daily as needed.   GLUCOSAMINE CHONDROITIN COMPLX PO Take 2 tablets by mouth daily.    HYDROcodone bit-homatropine (HYCODAN) 5-1.5 MG/5ML syrup Take 5 mLs by mouth every 8 (eight) hours as needed for cough.   meloxicam (MOBIC) 7.5 MG tablet TAKE 1 to 2 TABLETS BY MOUTH DAILY AS NEEDED FOR PAIN   Multiple Vitamins-Minerals (ALIVE WOMENS 50+ PO) Take 1 tablet by mouth every morning.   OVER THE COUNTER MEDICATION Take 1 tablet by mouth daily. Tumeric and cumin   pantoprazole (PROTONIX) 40 MG tablet Take 1 tablet (40 mg  total) by mouth 2 (two) times daily.   raloxifene (EVISTA) 60 MG tablet TAKE 1 TABLET BY MOUTH EVERY DAY   TRAVATAN Z 0.004 % SOLN ophthalmic solution Place 1 drop into both eyes at bedtime.      Allergies:   Amoxicillin and Albuterol   Social History   Socioeconomic History   Marital status: Married    Spouse name: Not on file   Number of children: Not on file   Years of education: Not on file   Highest education level: Not on file  Occupational History   Not on file  Tobacco Use   Smoking status: Never   Smokeless tobacco: Never  Vaping Use   Vaping Use: Never used  Substance and Sexual Activity   Alcohol use:  Yes    Comment:  occasionally   Drug use: No   Sexual activity: Not on file  Other Topics Concern   Not on file  Social History Narrative   Not on file   Social Determinants of Health   Financial Resource Strain: Low Risk  (03/07/2021)   Overall Financial Resource Strain (CARDIA)    Difficulty of Paying Living Expenses: Not hard at all  Food Insecurity: No Food Insecurity (03/07/2021)   Hunger Vital Sign    Worried About Running Out of Food in the Last Year: Never true    Ran Out of Food in the Last Year: Never true  Transportation Needs: No Transportation Needs (03/07/2021)   PRAPARE - Hydrologist (Medical): No    Lack of Transportation (Non-Medical): No  Physical Activity: Insufficiently Active (03/07/2021)   Exercise Vital Sign    Days of Exercise per Week: 3 days    Minutes of Exercise per Session: 30 min  Stress: No Stress Concern Present (03/07/2021)   [place]    Feeling of Stress : Not at all  Social Connections: Moderately Integrated (03/07/2021)   Social Connection and Isolation Panel [NHANES]    Frequency of Communication with Friends and Family: Twice a week    Frequency of Social Gatherings with Friends and Family: Twice a week    Attends Religious Services: 1 to 4 times per year    Active Member of [employer] or Organizations: No    Attends Music therapist: Never    Marital Status: Married     Family History: The patient's family history includes Arrhythmia in her brother and sister; Breast cancer (age of onset: 31) in her mother; CAD in her sister; Cancer in her mother; Hypertension in her mother; Hypothyroidism in her sister; Osteoporosis in her mother; Rectal cancer in her mother. There is no history of Colon polyps, Esophageal cancer, or Stomach cancer.    ROS:   Please see the history of present illness.    All other systems reviewed and are negative.    EKGs/Labs/Other Studies Reviewed:    The following studies were reviewed today:  Monitor 01/2020 Sinus rhythm with occasional PACs, short bursts SVT    Longest 18 beats Rates 55 to 171 bpm   Average HR 76 bpm Not all episodes of SVT sensed   Only one.   Most of triggered events correlate with SR   Echo 12/2013 Study Conclusions   - Left ventricle: The cavity size was normal. Wall thickness was    normal. Systolic function was vigorous. The estimated ejection    fraction was in the range of 65% to 70%.  Coronary CT 12/2013 IMPRESSION: 1. Coronary artery calcium score of 126 Agatston units. This places the patient in the Hartley percentile for her age and gender. This suggests high risk for future cardiac events.   2. Extensive plaque noted in the proximal LAD. However, there did not appear to be more than 50% stenosis. The remainder of the LAD and the LCx and RCA systems did not have significant disease.   3. Unless patient has typical symptoms, would suggest aggressive medical management of CAD.  EKG:  EKG is  ordered today.  The ekg ordered today demonstrates normal sinus rhythm at rate of 67 bpm  Recent Labs: 12/20/2020: ALT 18; BUN 15; Creatinine, Ser 0.86; Hemoglobin 13.1; Platelets 245.0; Potassium 4.1; Sodium 138; TSH 1.87  Recent Lipid Panel    Component Value Date/Time   CHOL 135 12/20/2020 0958   CHOL 110 04/10/2016 1143   CHOL 96 (L) 12/21/2014 0824   TRIG 82.0 12/20/2020 0958   TRIG 66 12/21/2014 0824   HDL 57.90 12/20/2020 0958   HDL 54 04/10/2016 1143   HDL 45 (L) 12/21/2014 0824   CHOLHDL 2 12/20/2020 0958   VLDL 16.4 12/20/2020 0958   LDLCALC 61 12/20/2020 0958   LDLCALC 45 04/10/2016 1143   LDLCALC 38 12/21/2014 0824    Physical Exam:    VS:  BP 110/64   Pulse 65   Ht '5\' 1"'$  (1.549 m)   Wt 157 lb (71.2 kg)   SpO2 95%   BMI 29.66 kg/m     Wt Readings from Last 3 Encounters:  11/26/21 157 lb (71.2 kg)  03/11/21 154 lb 6.4 oz (70 kg)   01/30/21 158 lb 9.6 oz (71.9 kg)     GEN:  Well nourished, well developed in no acute distress HEENT: Normal NECK: No JVD; No carotid bruits LYMPHATICS: No lymphadenopathy CARDIAC: RRR, no murmurs, rubs, gallops RESPIRATORY:  Clear to auscultation without rales, wheezing or rhonchi  ABDOMEN: Soft, non-tender, non-distended MUSCULOSKELETAL:  No edema; No deformity  SKIN: Warm and dry NEUROLOGIC:  Alert and oriented x 3 PSYCHIATRIC:  Normal affect   ASSESSMENT AND PLAN:    Symptomatic palpitation Prior history of SVT on monitor.  Reports episode yesterday lasting for about 20 seconds with dizziness and shortness of breath.  No loss of consciousness.  She never took Toprol-XL 12.5 mg daily.    Occasional stress related palpitation.  Will get monitor and echocardiogram.  She will keep a diary of her symptoms.  Not interested in medication.  2.  Lower extremity edema Without orthopnea or PND.  Uses low-sodium diet.  We will get echocardiogram to rule out LV dysfunction or wall motion abnormality.  Trial of Lasix.  Recommended leg elevation and compression stocking.  3.  Hyperlipidemia -On Zetia  4.  Anxiety -She was prescribed medication but not taking.  Recommended to follow-up with PCP.  Seems  underlying significant stress.  Medication Adjustments/Labs and Tests Ordered: Current medicines are reviewed at length with the patient today.  Concerns regarding medicines are outlined above.  Orders Placed This Encounter  Procedures   Basic Metabolic Panel (BMET)   Pro b natriuretic peptide (BNP)[facility])   TSH   LONG TERM MONITOR (3-14 DAYS)   EKG 12-Lead   ECHOCARDIOGRAM COMPLETE   Meds ordered this encounter  Medications   furosemide (LASIX) 20 MG tablet    Sig: Take 1 tablet (20 mg total) by mouth daily as needed.    Dispense:  30 tablet  Refill:  3    Patient Instructions  Medication Instructions:  START Lasix '20mg'$  Take 1 tablet daily for two  days then take on a as needed basis *If you need a refill on your cardiac medications before your next appointment, please call your pharmacy*   Lab Work: TODAY-BMET, BNP, TSH If you have labs (blood work) drawn today and your tests are completely normal, you will receive your results only by: [place] (if you have MyChart) OR A paper copy in the mail If you have any lab test that is abnormal or we need to change your treatment, we will call you to review the results.   Testing/Procedures: Your physician has requested that you have an echocardiogram. Echocardiography is a painless test that uses sound waves to create images of your heart. It provides your doctor with information about the size and shape of your heart and how well your heart's chambers and valves are working. This procedure takes approximately one hour. There are no restrictions for this procedure.  ZIO XT- Long Term Monitor Instructions  Your physician has requested you wear a ZIO patch monitor for 14 days.  This is a single patch monitor. [employer] supplies one patch monitor per enrollment. Additional stickers are not available. Please do not apply patch if you will be having a Nuclear Stress Test,  Echocardiogram, Cardiac CT, MRI, or Chest Xray during the period you would be wearing the  monitor. The patch cannot be worn during these tests. You cannot remove and re-apply the  ZIO XT patch monitor.  Your ZIO patch monitor will be mailed 3 day USPS to your address on file. It may take 3-5 days  to receive your monitor after you have been enrolled.  Once you have received your monitor, please review the enclosed instructions. Your monitor  has already been registered assigning a specific monitor serial # to you.  Billing and Patient Assistance Program Information  We have supplied [employer] with any of your insurance information on file for billing purposes. [employer] offers a sliding scale Patient Assistance Program  for patients that do not have  insurance, or whose insurance does not completely cover the cost of the ZIO monitor.  You must apply for the Patient Assistance Program to qualify for this discounted rate.  To apply, please call [employer] at [phone], select option 4, select option 2, ask to apply for  Patient Assistance Program. Theodore Demark will ask your household income, and how many people  are in your household. They will quote your out-of-pocket cost based on that information.  [employer] will also be able to set up a 47-month interest-free payment plan if needed.  Applying the monitor   Shave hair from upper left chest.  Hold abrader disc by orange tab. Rub abrader in 40 strokes over the upper left chest as  indicated in your monitor instructions.  Clean area with 4 enclosed alcohol pads. Let dry.  Apply patch as indicated in monitor instructions. Patch will be placed under collarbone on left  side of chest with arrow pointing upward.  Rub patch adhesive wings for 2 minutes. Remove white label marked "1". Remove the white  label marked "2". Rub patch adhesive wings for 2 additional minutes.  While looking in a mirror, press and release button in center of patch. A small green light will  flash 3-4 times. This will be your only indicator that the monitor has been turned on.  Do not shower for the first 24 hours. You  may shower after the first 24 hours.  Press the button if you feel a symptom. You will hear a small click. Record Date, Time and  Symptom in the Patient Logbook.  When you are ready to remove the patch, follow instructions on the last 2 pages of Patient  Logbook. Stick patch monitor onto the last page of Patient Logbook.  Place Patient Logbook in the blue and white box. Use locking tab on box and tape box closed  securely. The blue and white box has prepaid postage on it. Please place it in the mailbox as  soon as possible. Your physician should have your test results  approximately 7 days after the  monitor has been mailed back to [facility].  Call [place] at [phone] if you have questions regarding  your ZIO XT patch monitor. Call them immediately if you see an orange light blinking on your  monitor.  If your monitor falls off in less than 4 days, contact our Monitor department at [phone].  If your monitor becomes loose or falls off after 4 days call [employer] at [phone] for  suggestions on securing your monitor  Follow-Up: At [facility], you and your health needs are our priority.  As part of our continuing mission to provide you with exceptional heart care, we have created designated Provider Care Teams.  These Care Teams include your primary Cardiologist (physician) and Advanced Practice Providers (APPs -  Physician Assistants and Nurse Practitioners) who all work together to provide you with the care you need, when you need it.  We recommend signing up for the patient portal called "MyChart".  Sign up information is provided on this After Visit Summary.  MyChart is used to connect with patients for Virtual Visits (Telemedicine).  Patients are able to view lab/test results, encounter notes, upcoming appointments, etc.  Non-urgent messages can be sent to your provider as well.   To learn more about what you can do with MyChart, go to [email or web address].    Your next appointment:   6 week(s)  The format for your next appointment:   In Person  Provider:   Robbie Lis, PA-C      Other Instructions   Important Information About Sugar         Jarrett Soho, PA  11/26/2021 3:17 PM    [place]

## 2021-11-26 NOTE — Progress Notes (Unsigned)
Enrolled for [employer] to mail a ZIO XT long term holter monitor to the patients address on file.   Dr. Ross to read. 

## 2021-11-27 LAB — BASIC METABOLIC PANEL
BUN/Creatinine Ratio: 18 (ref 12–28)
BUN: 18 mg/dL (ref 8–27)
CO2: 25 mmol/L (ref 20–29)
Calcium: 9.5 mg/dL (ref 8.7–10.3)
Chloride: 99 mmol/L (ref 96–106)
Creatinine, Ser: 1.02 mg/dL — ABNORMAL HIGH (ref 0.57–1.00)
Glucose: 87 mg/dL (ref 70–99)
Potassium: 4.3 mmol/L (ref 3.5–5.2)
Sodium: 140 mmol/L (ref 134–144)
eGFR: 57 mL/min/{1.73_m2} — ABNORMAL LOW (ref >59)

## 2021-11-27 LAB — TSH: TSH: 2.77 u[IU]/mL (ref 0.450–4.500)

## 2021-11-27 LAB — PRO B NATRIURETIC PEPTIDE: NT-Pro BNP: 141 pg/mL (ref 0–738)

## 2021-11-30 DIAGNOSIS — R002 Palpitations: Secondary | ICD-10-CM | POA: Diagnosis not present

## 2021-11-30 DIAGNOSIS — R0602 Shortness of breath: Secondary | ICD-10-CM | POA: Diagnosis not present

## 2021-11-30 DIAGNOSIS — R600 Localized edema: Secondary | ICD-10-CM

## 2021-12-03 ENCOUNTER — Other Ambulatory Visit: Payer: Self-pay | Admitting: Internal Medicine

## 2021-12-04 ENCOUNTER — Ambulatory Visit (HOSPITAL_COMMUNITY): Payer: Medicare Other | Attending: Physician Assistant

## 2021-12-04 DIAGNOSIS — R002 Palpitations: Secondary | ICD-10-CM | POA: Diagnosis not present

## 2021-12-04 DIAGNOSIS — R600 Localized edema: Secondary | ICD-10-CM

## 2021-12-04 DIAGNOSIS — R0602 Shortness of breath: Secondary | ICD-10-CM | POA: Diagnosis not present

## 2021-12-05 LAB — ECHOCARDIOGRAM COMPLETE
Area-P 1/2: 3.53 cm2
S' Lateral: 2.2 cm

## 2021-12-18 ENCOUNTER — Encounter: Payer: Self-pay | Admitting: Gastroenterology

## 2021-12-18 ENCOUNTER — Ambulatory Visit: Payer: Medicare Other | Admitting: Gastroenterology

## 2021-12-18 ENCOUNTER — Telehealth: Payer: Self-pay

## 2021-12-18 VITALS — BP 120/60 | HR 69 | Ht 61.5 in | Wt 153.0 lb

## 2021-12-18 DIAGNOSIS — Z800 Family history of malignant neoplasm of digestive organs: Secondary | ICD-10-CM | POA: Insufficient documentation

## 2021-12-18 DIAGNOSIS — Z86010 Personal history of colonic polyps: Secondary | ICD-10-CM | POA: Diagnosis not present

## 2021-12-18 DIAGNOSIS — K219 Gastro-esophageal reflux disease without esophagitis: Secondary | ICD-10-CM

## 2021-12-18 MED ORDER — NA SULFATE-K SULFATE-MG SULF 17.5-3.13-1.6 GM/177ML PO SOLN: 1.0000 | Freq: Once | ORAL | 0 refills | Status: AC

## 2021-12-18 MED ORDER — PANTOPRAZOLE SODIUM 40 MG PO TBEC: 40.0000 mg | DELAYED_RELEASE_TABLET | Freq: Two times a day (BID) | ORAL | 3 refills | Status: DC

## 2021-12-18 NOTE — Progress Notes (Signed)
12/18/2021 Denise Ewing [id number] 06/02/1945   HISTORY OF PRESENT ILLNESS: This is a pleasant 76 year old female who is a patient of Dr. Ardis Hughs.  She is here today for follow-up her GERD.  She is on pantoprazole 40 mg twice daily and Pepcid/famotidine 20 mg at bedtime.  This controls her symptoms.  She does use meloxicam ongoingly on a daily basis for arthritis in her hands.  She has tried to stop it before and has not been able to do so.  She has family history of rectal cancer in her mother.  Patient also has personal history of colon polyps on her first and only colonoscopy that was performed in [place] several years ago.  I do not believe we have those records.  She had a Cologuard in December 2020 that was negative.  She would like to go ahead and proceed with a colonoscopy in December.  She denies rectal bleeding.   Past Medical History:  Diagnosis Date   Allergy    post nasal drip   Arthritis    Blood transfusion without reported diagnosis    with hysterectomy 11 years ago   GERD (gastroesophageal reflux disease)    Glaucoma    bilateral eyes   History of colonic polyps    Melanoma of thigh ([place])    Osteoporosis    Past Surgical History:  Procedure Laterality Date   Colon polyps removed     Melanoma removed     R thigh   TOTAL ABDOMINAL HYSTERECTOMY W/ BILATERAL SALPINGOOPHORECTOMY     uterine fibroids    reports that she has never smoked. She has never used smokeless tobacco. She reports current alcohol use. She reports that she does not use drugs. family history includes Arrhythmia in her brother and sister; Breast cancer (age of onset: 21) in her mother; CAD in her sister; Cancer in her mother; Hypertension in her mother; Hypothyroidism in her sister; Osteoporosis in her mother; Rectal cancer in her mother. Allergies  Allergen Reactions   Amoxicillin Hives    Did it involve swelling of the face/tongue/throat, SOB, or low BP? No Did it involve sudden or severe  rash/hives, skin peeling, or any reaction on the inside of your mouth or nose? Yes Did you need to seek medical attention at a hospital or doctor's office? No When did it last happen?   >45yr ago     If all above answers are "NO", may proceed with cephalosporin use.   Albuterol Other (See Comments)    gagging      Outpatient Encounter Medications as of 12/18/2021  Medication Sig   aspirin 81 MG tablet Take 81 mg by mouth daily.   atorvastatin (LIPITOR) 10 MG tablet TAKE 1 TABLET BY MOUTH EVERY DAY   benzonatate (TESSALON) 100 MG capsule Take 1 capsule (100 mg total) by mouth 3 (three) times daily as needed for cough.   Calcium Citrate-Vitamin D 250-200 MG-UNIT TABS Take 2 tablets by mouth at bedtime.   Coenzyme Q10 (CO Q-10 PO) Take 1 capsule by mouth every morning.   conjugated estrogens (PREMARIN) vaginal cream Place 1.5 g vaginally once a week. Take one tablet by mouth on Sundays per patient   dorzolamide-timolol (COSOPT) 22.3-6.8 MG/ML ophthalmic solution 1 drop 2 (two) times daily.   ezetimibe (ZETIA) 10 MG tablet TAKE 1/2 TABLET BY MOUTH EVERY DAY   famotidine (PEPCID) 20 MG tablet Take 20 mg by mouth daily.   furosemide (LASIX) 20 MG tablet Take 1 tablet (20  mg total) by mouth daily as needed.   GLUCOSAMINE CHONDROITIN COMPLX PO Take 2 tablets by mouth daily.    HYDROcodone bit-homatropine (HYCODAN) 5-1.5 MG/5ML syrup Take 5 mLs by mouth every 8 (eight) hours as needed for cough.   meloxicam (MOBIC) 7.5 MG tablet TAKE 1 to 2 TABLETS BY MOUTH DAILY AS NEEDED FOR PAIN   metoprolol succinate (TOPROL XL) 25 MG 24 hr tablet Take 0.5 (12.5 mg)_to 1 tablet (25 mg) daily   Multiple Vitamins-Minerals (ALIVE WOMENS 50+ PO) Take 1 tablet by mouth every morning.   OVER THE COUNTER MEDICATION Take 1 tablet by mouth daily. Tumeric and cumin   raloxifene (EVISTA) 60 MG tablet TAKE 1 TABLET BY MOUTH EVERY DAY   TRAVATAN Z 0.004 % SOLN ophthalmic solution Place 1 drop into both eyes at bedtime.     [DISCONTINUED] pantoprazole (PROTONIX) 40 MG tablet Take 1 tablet (40 mg total) by mouth 2 (two) times daily.   pantoprazole (PROTONIX) 40 MG tablet Take 1 tablet (40 mg total) by mouth 2 (two) times daily.   [DISCONTINUED] famotidine (PEPCID) 20 MG tablet Take one tablet after dinner   No facility-administered encounter medications on file as of 12/18/2021.     REVIEW OF SYSTEMS  : All other systems reviewed and negative except where noted in the History of Present Illness.   PHYSICAL EXAM: BP 120/60   Pulse 69   Ht 5' 1.5" (1.562 m)   Wt 153 lb (69.4 kg)   BMI 28.44 kg/m  General: Well developed white female in no acute distress Head: Normocephalic and atraumatic Eyes:  Sclerae anicteric, conjunctiva pink. Ears: Normal auditory acuity Lungs: Clear throughout to auscultation; no W/R/R. Heart: Regular rate and rhythm; no M/R/G. Abdomen: Soft, non-distended.  BS present.  Non-tender. Rectal:  Will be done at the time of colonoscopy. Musculoskeletal: Symmetrical with no gross deformities  Skin: No lesions on visible extremities Extremities: No edema  Neurological: Alert oriented x 4, grossly non-focal Psychological:  Alert and cooperative. Normal mood and affect  ASSESSMENT AND PLAN: *GERD: Well-controlled on pantoprazole 40 mg twice daily and famotidine 20 mg at bedtime.  She uses NSAIDs in the form of meloxicam daily due to arthritis in her hands. *Family history of rectal cancer in her mother and personal history of colon polyps: Last and only colonoscopy was several years ago in [place].  She had a Cologuard December 2020 that was negative.  She would like to go ahead and proceed with colonoscopy in December.  This will be scheduled with Dr. Rush Landmark.  The risks, benefits, and alternatives were discussed with the patient and she consents to proceed.  Recent EKG and ECHO look good, but she just mailed in a ZIO monitor that she wore for 14 days.  We will request cardiology  clearance.  CC:  [name], MD

## 2021-12-18 NOTE — Patient Instructions (Addendum)
You have been scheduled for a colonoscopy. Please follow written instructions given to you at your visit today.  Please pick up your prep supplies at the pharmacy within the next 1-3 days. If you use inhalers (even only as needed), please bring them with you on the day of your procedure.  We will obtain cardiac clearance prior to your procedure.   We have sent the following medications to your pharmacy for you to pick up at your convenience: Pantoprazole  I appreciate the opportunity to care for you. Alonza Bogus, PA-C

## 2021-12-18 NOTE — Telephone Encounter (Signed)
I saw the pt in Dec 2022   Doing well at the time  Hx of mild CAD  Unless developed new symptoms I think she is at low risk for colonoscopy   OK to proceed.

## 2021-12-18 NOTE — Progress Notes (Signed)
Attending Physician's Attestation   I have reviewed the chart.   I agree with the Advanced Practitioner's note, impression, and recommendations with any updates as below.    Olander Friedl Mansouraty, MD [facility] Advanced Endoscopy Office # [phone]  

## 2021-12-18 NOTE — Telephone Encounter (Signed)
[facility] Pre-operative Risk Assessment     Request for surgical clearance:     Endoscopy Procedure  What type of surgery is being performed?     colonoscopy  When is this surgery scheduled?     02/05/22  What type of clearance is required ?   Medical  Practice name and name of physician performing surgery?      [facility], Dr Justice Britain   What is your office phone and fax number?      Phone- [phone]  [phone]  Anesthesia type (None, local, MAC, general) ?       MAC

## 2021-12-19 NOTE — Telephone Encounter (Signed)
   Name: Denise Ewing  DOB: 16-Jan-1946  MRN: [id number]  Primary Cardiologist: Dorris Carnes, MD  Chart reviewed as part of pre-operative protocol coverage. Unique Sillas has an appointment 01/01/22 and clearance will be addressed at that time. Appt notes updated.  Will route to requesting party so they are aware and remove from preop pool.  Loel Dubonnet, NP  12/19/2021, 8:36 AM

## 2021-12-26 DIAGNOSIS — R0602 Shortness of breath: Secondary | ICD-10-CM | POA: Diagnosis not present

## 2021-12-26 DIAGNOSIS — R002 Palpitations: Secondary | ICD-10-CM | POA: Diagnosis not present

## 2021-12-30 DIAGNOSIS — M47816 Spondylosis without myelopathy or radiculopathy, lumbar region: Secondary | ICD-10-CM | POA: Diagnosis not present

## 2021-12-30 DIAGNOSIS — M5136 Other intervertebral disc degeneration, lumbar region: Secondary | ICD-10-CM | POA: Diagnosis not present

## 2021-12-30 DIAGNOSIS — M25551 Pain in right hip: Secondary | ICD-10-CM | POA: Diagnosis not present

## 2021-12-30 DIAGNOSIS — M5459 Other low back pain: Secondary | ICD-10-CM | POA: Diagnosis not present

## 2022-01-01 ENCOUNTER — Ambulatory Visit: Payer: Medicare Other | Attending: Physician Assistant | Admitting: Physician Assistant

## 2022-01-01 ENCOUNTER — Encounter: Payer: Self-pay | Admitting: Physician Assistant

## 2022-01-01 VITALS — BP 110/76 | HR 73 | Ht 61.5 in | Wt 158.6 lb

## 2022-01-01 DIAGNOSIS — Z01810 Encounter for preprocedural cardiovascular examination: Secondary | ICD-10-CM | POA: Diagnosis not present

## 2022-01-01 DIAGNOSIS — I2510 Atherosclerotic heart disease of native coronary artery without angina pectoris: Secondary | ICD-10-CM

## 2022-01-01 DIAGNOSIS — R002 Palpitations: Secondary | ICD-10-CM | POA: Diagnosis not present

## 2022-01-01 DIAGNOSIS — I471 Supraventricular tachycardia, unspecified: Secondary | ICD-10-CM | POA: Diagnosis not present

## 2022-01-01 MED ORDER — METOPROLOL SUCCINATE ER 25 MG PO TB24: 12.5000 mg | ORAL_TABLET | Freq: Every day | ORAL | 11 refills | Status: DC

## 2022-01-01 NOTE — Patient Instructions (Signed)
Medication Instructions:   START Toprol XL one half (0.5) tablet (12.5 mg) by mouth daily.   *If you need a refill on your cardiac medications before your next appointment, please call your pharmacy*   Lab Work:  None ordered.  If you have labs (blood work) drawn today and your tests are completely normal, you will receive your results only by: [place] (if you have MyChart) OR A paper copy in the mail If you have any lab test that is abnormal or we need to change your treatment, we will call you to review the results.   Testing/Procedures:  None ordered.   Follow-Up: At [facility], you and your health needs are our priority.  As part of our continuing mission to provide you with exceptional heart care, we have created designated Provider Care Teams.  These Care Teams include your primary Cardiologist (physician) and Advanced Practice Providers (APPs -  Physician Assistants and Nurse Practitioners) who all work together to provide you with the care you need, when you need it.  We recommend signing up for the patient portal called "MyChart".  Sign up information is provided on this After Visit Summary.  MyChart is used to connect with patients for Virtual Visits (Telemedicine).  Patients are able to view lab/test results, encounter notes, upcoming appointments, etc.  Non-urgent messages can be sent to your provider as well.   To learn more about what you can do with MyChart, go to [email or web address].    Your next appointment:   4 month(s)  The format for your next appointment:   In Person  Provider:   Dorris Carnes, MD     Important Information About Sugar

## 2022-01-01 NOTE — Progress Notes (Signed)
Cardiology Office Note:    Date:  01/01/2022   ID:  Denise Ewing, DOB [birth year], MRN [id number]  PCP:  [name], MD  [facility] Cardiologist:  Dorris Carnes, MD  [place] Electrophysiologist:  None   Chief Complaint: Surgical clearance for colonoscopy/endoscopy  History of Present Illness:    Denise Ewing is a 76 y.o. female with a hx of nonobstructive mild CAD with chronic atypical pain, palpitation, PSVT and GERD seen for follow-up and surgical clearance.  Coronary CTA November 2015 with calcium score of 126, less than 50% stenosis in proximal LAD. Echo 12/2013: normal LVEF Monitor December 2021 showed sinus rhythm with occasional PAC and short runs of SVT.  Prescribed Toprol but never took.  Seen by me 11/26/2021 for symptomatic palpitation.  Palpitation lasting contracted second with dizziness and shortness of breath.  Normal echo 12/04/21 with LVEF of 65-70%.  Monitor 12/26/21 with intermittent brief SVT, PAC and PVCs.   Patient is here for follow-up.  She continues to have intermittent palpitation with feeling.  No chest pain.  Some shortness of breath with climbing stairs but this is stable.  No orthopnea, PND, syncope, lower extremity edema or melena.  Past Medical History:  Diagnosis Date   Allergy    post nasal drip   Arthritis    Blood transfusion without reported diagnosis    with hysterectomy 11 years ago   GERD (gastroesophageal reflux disease)    Glaucoma    bilateral eyes   History of colonic polyps    Melanoma of thigh ([place])    Osteoporosis     Past Surgical History:  Procedure Laterality Date   Colon polyps removed     Melanoma removed     R thigh   TOTAL ABDOMINAL HYSTERECTOMY W/ BILATERAL SALPINGOOPHORECTOMY     uterine fibroids    Current Medications: Current Meds  Medication Sig   aspirin 81 MG tablet Take 81 mg by mouth daily.   atorvastatin (LIPITOR) 10 MG tablet TAKE 1 TABLET BY MOUTH EVERY DAY   benzonatate (TESSALON)  100 MG capsule Take 1 capsule (100 mg total) by mouth 3 (three) times daily as needed for cough.   Calcium Citrate-Vitamin D 250-200 MG-UNIT TABS Take 2 tablets by mouth at bedtime.   Coenzyme Q10 (CO Q-10 PO) Take 1 capsule by mouth every morning.   conjugated estrogens (PREMARIN) vaginal cream Place 1.5 g vaginally once a week. Take one tablet by mouth on Sundays per patient   dorzolamide-timolol (COSOPT) 22.3-6.8 MG/ML ophthalmic solution 1 drop 2 (two) times daily.   ezetimibe (ZETIA) 10 MG tablet TAKE 1/2 TABLET BY MOUTH EVERY DAY   famotidine (PEPCID) 20 MG tablet Take 20 mg by mouth daily.   furosemide (LASIX) 20 MG tablet Take 1 tablet (20 mg total) by mouth daily as needed.   GLUCOSAMINE CHONDROITIN COMPLX PO Take 2 tablets by mouth daily.    HYDROcodone bit-homatropine (HYCODAN) 5-1.5 MG/5ML syrup Take 5 mLs by mouth every 8 (eight) hours as needed for cough.   meloxicam (MOBIC) 7.5 MG tablet Take one daily   metoprolol succinate (TOPROL XL) 25 MG 24 hr tablet Take 0.5 (12.5 mg)_to 1 tablet (25 mg) daily   metoprolol succinate (TOPROL XL) 25 MG 24 hr tablet Take 0.5 tablets (12.5 mg total) by mouth daily.   Multiple Vitamins-Minerals (ALIVE WOMENS 50+ PO) Take 1 tablet by mouth every morning.   OVER THE COUNTER MEDICATION Take 1 tablet by mouth daily. Tumeric and cumin  pantoprazole (PROTONIX) 40 MG tablet Take 1 tablet (40 mg total) by mouth 2 (two) times daily.   raloxifene (EVISTA) 60 MG tablet TAKE 1 TABLET BY MOUTH EVERY DAY   TRAVATAN Z 0.004 % SOLN ophthalmic solution Place 1 drop into both eyes at bedtime.      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, multimedia  Tobacco Use   Smoking status: Never   Smokeless tobacco:  Never  Vaping Use   Vaping Use: Never used  Substance and Sexual Activity   Alcohol use: Yes    Comment:  occasionally   Drug use: No   Sexual activity: Not on file  Other Topics Concern   Not on file  Social History Narrative   Not on file   Social Determinants of Health   Financial Resource Strain: Low Risk  (03/07/2021)   Overall Financial Resource Strain (CARDIA)    Difficulty of Paying Living Expenses: Not hard at all  Food Insecurity: No Food Insecurity (03/07/2021)   Hunger Vital Sign    Worried About Running Out of Food in the Last Year: Never true    [place] in the Last Year: Never true  Transportation Needs: No Transportation Needs (03/07/2021)   PRAPARE - Hydrologist (Medical): No    Lack of Transportation (Non-Medical): No  Physical Activity: Insufficiently Active (03/07/2021)   Exercise Vital Sign    Days of Exercise per Week: 3 days    Minutes of Exercise per Session: 30 min  Stress: No Stress Concern Present (03/07/2021)   [place]    Feeling of Stress : Not at all  Social Connections: Moderately Integrated (03/07/2021)   Social Connection and Isolation Panel [NHANES]    Frequency of Communication with Friends and Family: Twice a week    Frequency of Social Gatherings with Friends and Family: Twice a week    Attends Religious Services: 1 to 4 times per year    Active Member of [employer] or Organizations: No    Attends Music therapist: Never    Marital Status: Married     Family History: The patient's family history includes Arrhythmia in her brother and sister; Breast cancer (age of onset: 22) in her mother; CAD in her sister; Cancer in her mother; Hypertension in her mother; Hypothyroidism in her sister; Osteoporosis in her mother; Rectal cancer in her mother. There is no history of Colon polyps, Esophageal cancer, or Stomach cancer.    ROS:    Please see the history of present illness.    All other systems reviewed and are negative.   EKGs/Labs/Other Studies Reviewed:    The following studies were reviewed today: As summarized above  EKG:  EKG is  ordered today.  The ekg ordered today demonstrates normal sinus rhythm  Recent Labs: 11/26/2021: BUN 18; Creatinine, Ser 1.02; NT-Pro BNP 141; Potassium 4.3; Sodium 140; TSH 2.770  Recent Lipid Panel    Component Value Date/Time   CHOL 135 12/20/2020 0958   CHOL 110 04/10/2016 1143   CHOL 96 (L) 12/21/2014 0824   TRIG 82.0 12/20/2020 0958   TRIG 66  12/21/2014 0824   HDL 57.90 12/20/2020 0958   HDL 54 04/10/2016 1143   HDL 45 (L) 12/21/2014 0824   CHOLHDL 2 12/20/2020 0958   VLDL 16.4 12/20/2020 0958   LDLCALC 61 12/20/2020 0958   LDLCALC 45 04/10/2016 1143   LDLCALC 38 12/21/2014 0824     Physical Exam:    VS:  BP 110/76 (BP Location: Left Arm, Patient Position: Sitting, Cuff Size: Normal)   Pulse 73   Ht 5' 1.5" (1.562 m)   Wt 158 lb 9.6 oz (71.9 kg)   SpO2 95%   BMI 29.48 kg/m     Wt Readings from Last 3 Encounters:  01/01/22 158 lb 9.6 oz (71.9 kg)  12/18/21 153 lb (69.4 kg)  11/26/21 157 lb (71.2 kg)     GEN:  Well nourished, well developed in no acute distress HEENT: Normal NECK: No JVD; No carotid bruits LYMPHATICS: No lymphadenopathy CARDIAC: RRR, no murmurs, rubs, gallops RESPIRATORY:  Clear to auscultation without rales, wheezing or rhonchi  ABDOMEN: Soft, non-tender, non-distended MUSCULOSKELETAL:  No edema; No deformity  SKIN: Warm and dry NEUROLOGIC:  Alert and oriented x 3 PSYCHIATRIC:  Normal affect   ASSESSMENT AND PLAN:    Symptomatic palpitation Monitor with PVC and SVT.  Also underlying stress and anxiety.  I have recommended complete cessation of caffeine.  She will keep a diary.  After long discussion trial of low-dose Toprol-XL.  2.  Mild CAD by coronary CT -I do not think her symptoms is anginal.  However I have recommended  keep a log/diary of chest pain or shortness of breath occurring with activity.  Add beta-blocker as above.  Continue Lipitor and Zetia.  3.  Surgical clearance Patient will be cleared at acceptable risk without additional testing.  I will route this recommendation to requesting provider. Getting > 4 mets of activity.    Medication Adjustments/Labs and Tests Ordered: Current medicines are reviewed at length with the patient today.  Concerns regarding medicines are outlined above.  Orders Placed This Encounter  Procedures   EKG 12-Lead   Meds ordered this encounter  Medications   metoprolol succinate (TOPROL XL) 25 MG 24 hr tablet    Sig: Take 0.5 tablets (12.5 mg total) by mouth daily.    Dispense:  15 tablet    Refill:  11    Patient Instructions  Medication Instructions:   START Toprol XL one half (0.5) tablet (12.5 mg) by mouth daily.   *If you need a refill on your cardiac medications before your next appointment, please call your pharmacy*   Lab Work:  None ordered.  If you have labs (blood work) drawn today and your tests are completely normal, you will receive your results only by: [place] (if you have MyChart) OR A paper copy in the mail If you have any lab test that is abnormal or we need to change your treatment, we will call you to review the results.   Testing/Procedures:  None ordered.   Follow-Up: At [facility], you and your health needs are our priority.  As part of our continuing mission to provide you with exceptional heart care, we have created designated Provider Care Teams.  These Care Teams include your primary Cardiologist (physician) and Advanced Practice Providers (APPs -  Physician Assistants and Nurse Practitioners) who all work together to provide you with the care you need, when you need it.  We recommend signing up for the patient portal called "MyChart".  Sign up  information is provided on this After Visit Summary.  MyChart  is used to connect with patients for Virtual Visits (Telemedicine).  Patients are able to view lab/test results, encounter notes, upcoming appointments, etc.  Non-urgent messages can be sent to your provider as well.   To learn more about what you can do with MyChart, go to [email or web address].    Your next appointment:   4 month(s)  The format for your next appointment:   In Person  Provider:   Dorris Carnes, MD     Important Information About Sugar         Jarrett Soho, [place]  01/01/2022 10:16 AM    [place]

## 2022-01-28 ENCOUNTER — Encounter: Payer: Medicare Other | Admitting: Gastroenterology

## 2022-01-28 ENCOUNTER — Encounter: Payer: Self-pay | Admitting: Gastroenterology

## 2022-02-05 ENCOUNTER — Encounter: Payer: Self-pay | Admitting: Gastroenterology

## 2022-02-05 ENCOUNTER — Ambulatory Visit (AMBULATORY_SURGERY_CENTER): Payer: Medicare Other | Admitting: Gastroenterology

## 2022-02-05 VITALS — BP 108/66 | HR 69 | Temp 95.9°F | Resp 16 | Ht 61.0 in | Wt 153.0 lb

## 2022-02-05 DIAGNOSIS — Z09 Encounter for follow-up examination after completed treatment for conditions other than malignant neoplasm: Secondary | ICD-10-CM

## 2022-02-05 DIAGNOSIS — Z86010 Personal history of colonic polyps: Secondary | ICD-10-CM

## 2022-02-05 DIAGNOSIS — Z800 Family history of malignant neoplasm of digestive organs: Secondary | ICD-10-CM

## 2022-02-05 DIAGNOSIS — D120 Benign neoplasm of cecum: Secondary | ICD-10-CM

## 2022-02-05 MED ORDER — SODIUM CHLORIDE 0.9 % IV SOLN: 500.0000 mL | INTRAVENOUS | Status: DC

## 2022-02-05 NOTE — Op Note (Signed)
[place] Patient Name: Denise Ewing Procedure Date: 02/05/2022 11:10 AM MRN: [id number] Endoscopist: Justice Britain , MD, [id number] Age: 76 Referring MD:  Date of Birth: April 11, 1945 Gender: Female Account #: [id number] Procedure:                Colonoscopy Indications:              High risk colon cancer surveillance: Personal                            history of colonic polyps, Family history of rectal                            cancer in a first-degree relative (mother) Medicines:                Monitored Anesthesia Care Procedure:                Pre-Anesthesia Assessment:                           - Prior to the procedure, a History and Physical                            was performed, and patient medications and                            allergies were reviewed. The patient's tolerance of                            previous anesthesia was also reviewed. The risks                            and benefits of the procedure and the sedation                            options and risks were discussed with the patient.                            All questions were answered, and informed consent                            was obtained. Prior Anticoagulants: The patient has                            taken no anticoagulant or antiplatelet agents                            except for aspirin and has taken no anticoagulant                            or antiplatelet agents except for NSAID medication.                            ASA Grade Assessment: II - A patient with mild  systemic disease. After reviewing the risks and                            benefits, the patient was deemed in satisfactory                            condition to undergo the procedure.                           After obtaining informed consent, the colonoscope                            was passed under direct vision. Throughout the                            procedure, the  patient's blood pressure, pulse, and                            oxygen saturations were monitored continuously. The                            PCF-HQ190L Colonoscope was introduced through the                            anus and advanced to the the cecum, identified by                            appendiceal orifice and ileocecal valve. The                            colonoscopy was somewhat difficult due to a                            tortuous colon. Successful completion of the                            procedure was aided by changing the patient's                            position, using manual pressure, straightening and                            shortening the scope to obtain bowel loop reduction                            and using scope torsion. The patient tolerated the                            procedure. The quality of the bowel preparation was                            good. The ileocecal valve, appendiceal orifice, and  rectum were photographed. Scope In: 11:20:30 AM Scope Out: 11:35:36 AM Scope Withdrawal Time: 0 hours 9 minutes 14 seconds  Total Procedure Duration: 0 hours 15 minutes 6 seconds  Findings:                 The digital rectal exam findings include                            hemorrhoids. Pertinent negatives include no                            palpable rectal lesions.                           The colon (entire examined portion) was                            significantly tortuous.                           A 6 mm polyp was found in the cecum. The polyp was                            sessile. The polyp was removed with a cold snare.                            Resection and retrieval were complete.                           Multiple small-mouthed diverticula were found in                            the recto-sigmoid colon and sigmoid colon.                           Normal mucosa was found in the entire colon                             otherwise.                           Non-bleeding non-thrombosed internal hemorrhoids                            were found during retroflexion, during perianal                            exam and during digital exam. The hemorrhoids were                            Grade II (internal hemorrhoids that prolapse but                            reduce spontaneously). Complications:            No immediate complications. Estimated Blood Loss:     Estimated blood loss was minimal. Impression:               -  Hemorrhoids found on digital rectal exam.                           - Tortuous colon.                           - One 6 mm polyp in the cecum, removed with a cold                            snare. Resected and retrieved.                           - Diverticulosis in the recto-sigmoid colon and in                            the sigmoid colon.                           - Normal mucosa in the entire examined colon                            otherwise.                           - Non-bleeding non-thrombosed internal hemorrhoids. Recommendation:           - The patient will be observed post-procedure,                            until all discharge criteria are met.                           - Discharge patient to home.                           - Patient has a contact number available for                            emergencies. The signs and symptoms of potential                            delayed complications were discussed with the                            patient. Return to normal activities tomorrow.                            Written discharge instructions were provided to the                            patient.                           - High fiber diet.                           - Use FiberCon 1-2 tablets  PO daily.                           - Continue present medications.                           - Await pathology results.                           - Depending on patient's  overall health and medical                            status at the time, a repeat colonoscopy in 5 years                            for surveillance could be considered no matter                            pathology due to history of previous colon polyps                            and family history of rectal cancer (but patient                            would need to be seen prior to any scheduling).                           - The findings and recommendations were discussed                            with the patient.                           - The findings and recommendations were discussed                            with the patient's family. Justice Britain, MD 02/05/2022 11:40:23 AM

## 2022-02-05 NOTE — Progress Notes (Unsigned)
GASTROENTEROLOGY PROCEDURE H&P NOTE   Primary Care Physician: [name], MD  HPI: Denise Ewing is a 76 y.o. female who presents for Colonoscopy for screening in settting of personal history of colon polyps and rectal cancer in mother.  Past Medical History:  Diagnosis Date   Allergy    post nasal drip   Arthritis    Blood transfusion without reported diagnosis    with hysterectomy 11 years ago   GERD (gastroesophageal reflux disease)    Glaucoma    bilateral eyes   History of colonic polyps    Melanoma of thigh ([place])    Osteoporosis    Past Surgical History:  Procedure Laterality Date   Colon polyps removed     Melanoma removed     R thigh   TOTAL ABDOMINAL HYSTERECTOMY W/ BILATERAL SALPINGOOPHORECTOMY     uterine fibroids   Current Outpatient Medications  Medication Sig Dispense Refill   conjugated estrogens (PREMARIN) vaginal cream Place 1.5 g vaginally once a week. Take one tablet by mouth on Sundays per patient     dorzolamide-timolol (COSOPT) 22.3-6.8 MG/ML ophthalmic solution 1 drop 2 (two) times daily.     meloxicam (MOBIC) 7.5 MG tablet Take one daily     TRAVATAN Z 0.004 % SOLN ophthalmic solution Place 1 drop into both eyes at bedtime.   1   aspirin 81 MG tablet Take 81 mg by mouth daily.     atorvastatin (LIPITOR) 10 MG tablet TAKE 1 TABLET BY MOUTH EVERY DAY 90 tablet 3   Calcium Citrate-Vitamin D 250-200 MG-UNIT TABS Take 2 tablets by mouth at bedtime.     Coenzyme Q10 (CO Q-10 PO) Take 1 capsule by mouth every morning.     ezetimibe (ZETIA) 10 MG tablet TAKE 1/2 TABLET BY MOUTH EVERY DAY 45 tablet 3   famotidine (PEPCID) 20 MG tablet Take 20 mg by mouth daily.     GLUCOSAMINE CHONDROITIN COMPLX PO Take 2 tablets by mouth daily.      metoprolol succinate (TOPROL XL) 25 MG 24 hr tablet Take 0.5 tablets (12.5 mg total) by mouth daily. 15 tablet 11   Multiple Vitamins-Minerals (ALIVE WOMENS 50+ PO) Take 1 tablet by mouth every morning.     OVER THE  COUNTER MEDICATION Take 1 tablet by mouth daily. Tumeric and cumin     pantoprazole (PROTONIX) 40 MG tablet Take 1 tablet (40 mg total) by mouth 2 (two) times daily. 180 tablet 3   raloxifene (EVISTA) 60 MG tablet TAKE 1 TABLET BY MOUTH EVERY DAY 90 tablet 0   Current Facility-Administered Medications  Medication Dose Route Frequency Provider Last Rate Last Admin   0.9 %  sodium chloride infusion  500 mL Intravenous Continuous Mansouraty, Telford Nab., MD        Current Outpatient Medications:    conjugated estrogens (PREMARIN) vaginal cream, Place 1.5 g vaginally once a week. Take one tablet by mouth on Sundays per patient, Disp: , Rfl:    dorzolamide-timolol (COSOPT) 22.3-6.8 MG/ML ophthalmic solution, 1 drop 2 (two) times daily., Disp: , Rfl:    meloxicam (MOBIC) 7.5 MG tablet, Take one daily, Disp: , Rfl:    TRAVATAN Z 0.004 % SOLN ophthalmic solution, Place 1 drop into both eyes at bedtime. , Disp: , Rfl: 1   aspirin 81 MG tablet, Take 81 mg by mouth daily., Disp: , Rfl:    atorvastatin (LIPITOR) 10 MG tablet, TAKE 1 TABLET BY MOUTH EVERY DAY, Disp: 90 tablet, Rfl: 3  Calcium Citrate-Vitamin D 250-200 MG-UNIT TABS, Take 2 tablets by mouth at bedtime., Disp: , Rfl:    Coenzyme Q10 (CO Q-10 PO), Take 1 capsule by mouth every morning., Disp: , Rfl:    ezetimibe (ZETIA) 10 MG tablet, TAKE 1/2 TABLET BY MOUTH EVERY DAY, Disp: 45 tablet, Rfl: 3   famotidine (PEPCID) 20 MG tablet, Take 20 mg by mouth daily., Disp: , Rfl:    GLUCOSAMINE CHONDROITIN COMPLX PO, Take 2 tablets by mouth daily. , Disp: , Rfl:    metoprolol succinate (TOPROL XL) 25 MG 24 hr tablet, Take 0.5 tablets (12.5 mg total) by mouth daily., Disp: 15 tablet, Rfl: 11   Multiple Vitamins-Minerals (ALIVE WOMENS 50+ PO), Take 1 tablet by mouth every morning., Disp: , Rfl:    OVER THE COUNTER MEDICATION, Take 1 tablet by mouth daily. Tumeric and cumin, Disp: , Rfl:    pantoprazole (PROTONIX) 40 MG tablet, Take 1 tablet (40 mg total)  by mouth 2 (two) times daily., Disp: 180 tablet, Rfl: 3   raloxifene (EVISTA) 60 MG tablet, TAKE 1 TABLET BY MOUTH EVERY DAY, Disp: 90 tablet, Rfl: 0  Current Facility-Administered Medications:    0.9 %  sodium chloride infusion, 500 mL, Intravenous, Continuous, Mansouraty, Telford Nab., MD Allergies  Allergen Reactions   Amoxicillin Hives    Did it involve swelling of the face/tongue/throat, SOB, or low BP? No Did it involve sudden or severe rash/hives, skin peeling, or any reaction on the inside of your mouth or nose? Yes Did you need to seek medical attention at a hospital or doctor's office? No When did it last happen?   >54yr ago     If all above answers are "NO", may proceed with cephalosporin use.   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, multimedia Tobacco Use   Smoking status: Never   Smokeless tobacco: Never  Vaping Use   Vaping Use: Never used  Substance and Sexual Activity   Alcohol use: Yes    Comment:  occasionally   Drug use: No   Sexual activity: Not on file  Other Topics Concern   Not on file  Social History Narrative   Not on file   Social Determinants of Health   Financial Resource Strain: Low Risk  (03/07/2021)   Overall Financial Resource Strain (CARDIA)    Difficulty of Paying Living Expenses: Not hard at all  Food Insecurity: No Food Insecurity (03/07/2021)    Hunger Vital Sign    Worried About Running Out of Food in the Last Year: Never true    Ran Out of Food in the Last Year: Never true  Transportation Needs: No Transportation Needs (03/07/2021)  PRAPARE - Hydrologist (Medical): No    Lack of Transportation (Non-Medical): No  Physical Activity: Insufficiently Active (03/07/2021)   Exercise Vital Sign    Days of Exercise per Week: 3 days    Minutes of Exercise per Session: 30 min  Stress: No Stress Concern Present (03/07/2021)   [place]    Feeling of Stress : Not at all  Social Connections: Moderately Integrated (03/07/2021)   Social Connection and Isolation Panel [NHANES]    Frequency of Communication with Friends and Family: Twice a week    Frequency of Social Gatherings with Friends and Family: Twice a week    Attends Religious Services: 1 to 4 times per year    Active Member of [employer] or Organizations: No    Attends Archivist Meetings: Never    Marital Status: Married  Human resources officer Violence: Not At Risk (03/07/2021)   Humiliation, Afraid, Rape, and Kick questionnaire    Fear of Current or Ex-Partner: No    Emotionally Abused: No    Physically Abused: No    Sexually Abused: No    Physical Exam: Today's Vitals   02/05/22 1021 02/05/22 1026  BP: (!) 145/65   Pulse: 67   Temp: (!) 95.9 F (35.5 C) (!) 95.9 F (35.5 C)  TempSrc: Skin   SpO2: 98%   Weight: 153 lb (69.4 kg)   Height: '5\' 1"'$  (1.549 m)    Body mass index is 28.91 kg/m. GEN: NAD EYE: Sclerae anicteric ENT: MMM CV: Non-tachycardic GI: Soft, NT/ND NEURO:  Alert & Oriented x 3  Lab Results: No results for input(s): "WBC", "HGB", "HCT", "PLT" in the last 72 hours. BMET No results for input(s): "NA", "K", "CL", "CO2", "GLUCOSE", "BUN", "CREATININE", "CALCIUM" in the last 72 hours. LFT No results for input(s): "PROT", "ALBUMIN", "AST", "ALT",  "ALKPHOS", "BILITOT", "BILIDIR", "IBILI" in the last 72 hours. PT/INR No results for input(s): "LABPROT", "INR" in the last 72 hours.   Impression / Plan: This is a 76 y.o.female who presents for Colonoscopy for screening in settting of personal history of colon polyps and rectal cancer in mother.  The risks and benefits of endoscopic evaluation/treatment were discussed with the patient and/or family; these include but are not limited to the risk of perforation, infection, bleeding, missed lesions, lack of diagnosis, severe illness requiring hospitalization, as well as anesthesia and sedation related illnesses.  The patient's history has been reviewed, patient examined, no change in status, and deemed stable for procedure.  The patient and/or family is agreeable to proceed.    Justice Britain, MD [facility]oscopy Office # [phone]

## 2022-02-05 NOTE — Progress Notes (Signed)
Called to room to assist during endoscopic procedure.  Patient ID and intended procedure confirmed with present staff. Received instructions for my participation in the procedure from the performing physician.  

## 2022-02-05 NOTE — Patient Instructions (Signed)
Please read handouts provided. Continue present medications. Await pathology results. High Fiber Diet. Use FiberCon 1-2 tablets daily. Recommending repeat colonoscopy in 5 years for screening.  YOU HAD AN ENDOSCOPIC PROCEDURE TODAY AT [facility]:   Refer to the procedure report that was given to you for any specific questions about what was found during the examination.  If the procedure report does not answer your questions, please call your gastroenterologist to clarify.  If you requested that your care partner not be given the details of your procedure findings, then the procedure report has been included in a sealed envelope for you to review at your convenience later.  YOU SHOULD EXPECT: Some feelings of bloating in the abdomen. Passage of more gas than usual.  Walking can help get rid of the air that was put into your GI tract during the procedure and reduce the bloating. If you had a lower endoscopy (such as a colonoscopy or flexible sigmoidoscopy) you may notice spotting of blood in your stool or on the toilet paper. If you underwent a bowel prep for your procedure, you may not have a normal bowel movement for a few days.  Please Note:  You might notice some irritation and congestion in your nose or some drainage.  This is from the oxygen used during your procedure.  There is no need for concern and it should clear up in a day or so.  SYMPTOMS TO REPORT IMMEDIATELY:  Following lower endoscopy (colonoscopy or flexible sigmoidoscopy):  Excessive amounts of blood in the stool  Significant tenderness or worsening of abdominal pains  Swelling of the abdomen that is new, acute  Fever of 100F or higher   For urgent or emergent issues, a gastroenterologist can be reached at any hour by calling [phone]. Do not use MyChart messaging for urgent concerns.    DIET:  We do recommend a small meal at first, but then you may proceed to your regular diet.  Drink plenty of  fluids but you should avoid alcoholic beverages for 24 hours.  ACTIVITY:  You should plan to take it easy for the rest of today and you should NOT DRIVE or use heavy machinery until tomorrow (because of the sedation medicines used during the test).    FOLLOW UP: Our staff will call the number listed on your records the next business day following your procedure.  We will call around 7:15- 8:00 am to check on you and address any questions or concerns that you may have regarding the information given to you following your procedure. If we do not reach you, we will leave a message.     If any biopsies were taken you will be contacted by phone or by letter within the next 1-3 weeks.  Please call us at [phone] if you have not heard about the biopsies in 3 weeks.    SIGNATURES/CONFIDENTIALITY: You and/or your care partner have signed paperwork which will be entered into your electronic medical record.  These signatures attest to the fact that that the information above on your After Visit Summary has been reviewed and is understood.  Full responsibility of the confidentiality of this discharge information lies with you and/or your care-partner.

## 2022-02-05 NOTE — Progress Notes (Unsigned)
Report to PACU, RN, vss, BBS= Clear.  

## 2022-02-06 ENCOUNTER — Telehealth: Payer: Self-pay | Admitting: *Deleted

## 2022-02-06 NOTE — Telephone Encounter (Signed)
  Follow up Call-     02/05/2022   10:26 AM  Call back number  Post procedure Call Back phone  # [phone]  Permission to leave phone message Yes     Patient questions:  Do you have a fever, pain , or abdominal swelling? No. Pain Score  0 *  Have you tolerated food without any problems? Yes.    Have you been able to return to your normal activities? Yes.    Do you have any questions about your discharge instructions: Diet   No. Medications  No. Follow up visit  No.  Do you have questions or concerns about your Care? No.  Actions: * If pain score is 4 or above: No action needed, pain <4.

## 2022-02-07 ENCOUNTER — Encounter: Payer: Self-pay | Admitting: Gastroenterology

## 2022-02-10 ENCOUNTER — Other Ambulatory Visit: Payer: Self-pay | Admitting: Family Medicine

## 2022-02-26 DIAGNOSIS — M5136 Other intervertebral disc degeneration, lumbar region: Secondary | ICD-10-CM | POA: Diagnosis not present

## 2022-02-26 DIAGNOSIS — M47816 Spondylosis without myelopathy or radiculopathy, lumbar region: Secondary | ICD-10-CM | POA: Diagnosis not present

## 2022-02-26 DIAGNOSIS — M48061 Spinal stenosis, lumbar region without neurogenic claudication: Secondary | ICD-10-CM | POA: Diagnosis not present

## 2022-03-28 DIAGNOSIS — H2513 Age-related nuclear cataract, bilateral: Secondary | ICD-10-CM | POA: Diagnosis not present

## 2022-03-28 DIAGNOSIS — H25013 Cortical age-related cataract, bilateral: Secondary | ICD-10-CM | POA: Diagnosis not present

## 2022-03-28 DIAGNOSIS — H25042 Posterior subcapsular polar age-related cataract, left eye: Secondary | ICD-10-CM | POA: Diagnosis not present

## 2022-03-28 DIAGNOSIS — H401113 Primary open-angle glaucoma, right eye, severe stage: Secondary | ICD-10-CM | POA: Diagnosis not present

## 2022-04-09 DIAGNOSIS — Z85820 Personal history of malignant melanoma of skin: Secondary | ICD-10-CM | POA: Diagnosis not present

## 2022-04-09 DIAGNOSIS — L821 Other seborrheic keratosis: Secondary | ICD-10-CM | POA: Diagnosis not present

## 2022-04-09 DIAGNOSIS — Z129 Encounter for screening for malignant neoplasm, site unspecified: Secondary | ICD-10-CM | POA: Diagnosis not present

## 2022-04-09 DIAGNOSIS — D1801 Hemangioma of skin and subcutaneous tissue: Secondary | ICD-10-CM | POA: Diagnosis not present

## 2022-04-10 DIAGNOSIS — M1611 Unilateral primary osteoarthritis, right hip: Secondary | ICD-10-CM | POA: Diagnosis not present

## 2022-04-10 DIAGNOSIS — M47816 Spondylosis without myelopathy or radiculopathy, lumbar region: Secondary | ICD-10-CM | POA: Diagnosis not present

## 2022-04-10 DIAGNOSIS — Z1330 Encounter for screening examination for mental health and behavioral disorders, unspecified: Secondary | ICD-10-CM | POA: Diagnosis not present

## 2022-04-10 DIAGNOSIS — M48061 Spinal stenosis, lumbar region without neurogenic claudication: Secondary | ICD-10-CM | POA: Diagnosis not present

## 2022-04-10 DIAGNOSIS — M5136 Other intervertebral disc degeneration, lumbar region: Secondary | ICD-10-CM | POA: Diagnosis not present

## 2022-04-25 ENCOUNTER — Other Ambulatory Visit: Payer: Self-pay | Admitting: Family Medicine

## 2022-05-07 ENCOUNTER — Ambulatory Visit: Payer: Medicare Other | Admitting: Internal Medicine

## 2022-05-12 ENCOUNTER — Other Ambulatory Visit: Payer: Self-pay | Admitting: Family Medicine

## 2022-05-14 ENCOUNTER — Telehealth: Payer: Self-pay | Admitting: Family Medicine

## 2022-05-14 ENCOUNTER — Ambulatory Visit (INDEPENDENT_AMBULATORY_CARE_PROVIDER_SITE_OTHER): Payer: Medicare Other | Admitting: Family Medicine

## 2022-05-14 ENCOUNTER — Other Ambulatory Visit: Payer: Self-pay | Admitting: Family Medicine

## 2022-05-14 VITALS — BP 134/60 | HR 77 | Temp 98.8°F | Resp 17 | Ht 61.0 in | Wt 160.1 lb

## 2022-05-14 DIAGNOSIS — M810 Age-related osteoporosis without current pathological fracture: Secondary | ICD-10-CM | POA: Diagnosis not present

## 2022-05-14 DIAGNOSIS — M818 Other osteoporosis without current pathological fracture: Secondary | ICD-10-CM

## 2022-05-14 DIAGNOSIS — E669 Obesity, unspecified: Secondary | ICD-10-CM

## 2022-05-14 DIAGNOSIS — E785 Hyperlipidemia, unspecified: Secondary | ICD-10-CM

## 2022-05-14 NOTE — Patient Instructions (Signed)
Schedule your complete physical in 6 months We'll notify you of your lab results and make any changes if needed Continue to be active!  You're doing great! Try and decrease your carb/sugar intake to help w/ weight loss (although you look great!) Call and schedule your mammogram and have them send me a copy Call with any questions or concerns Stay Safe!  Stay Healthy! Happy Spring!!!

## 2022-05-14 NOTE — Telephone Encounter (Signed)
Pt has an apt today we will refill then

## 2022-05-14 NOTE — Progress Notes (Signed)
   Subjective:    Patient ID: Denise Ewing, female    DOB: 16-Oct-1945, 77 y.o.   MRN: [id number]  HPI Hyperlipidemia- chronic problem, on Lipitor 10mg  daily and Zetia 5mg  daily.  Denies CP, SOB, abd pain, N/V.  Osteoporosis- chronic problem, on Raloxifene.  UTD on DEXA- last scan showed improvement from osteoporosis to osteopenia.  Obesity- pt has gained ~5 lb since last visit.  BMI now 30.26  Pt is very active in farm life.  Not following a particular diet.  Pt to call and schedule mamm[facility] at [facility].     Review of Systems For ROS see HPI     Objective:   Physical Exam Vitals reviewed.  Constitutional:      General: She is not in acute distress.    Appearance: Normal appearance. She is well-developed. She is not ill-appearing.  HENT:     Head: Normocephalic and atraumatic.  Eyes:     Conjunctiva/sclera: Conjunctivae normal.     Pupils: Pupils are equal, round, and reactive to light.  Neck:     Thyroid: No thyromegaly.  Cardiovascular:     Rate and Rhythm: Normal rate and regular rhythm.     Heart sounds: Normal heart sounds. No murmur heard. Pulmonary:     Effort: Pulmonary effort is normal. No respiratory distress.     Breath sounds: Normal breath sounds.  Abdominal:     General: There is no distension.     Palpations: Abdomen is soft.     Tenderness: There is no abdominal tenderness.  Musculoskeletal:     Cervical back: Normal range of motion and neck supple.     Right lower leg: No edema.     Left lower leg: No edema.  Lymphadenopathy:     Cervical: No cervical adenopathy.  Skin:    General: Skin is warm and dry.  Neurological:     Mental Status: She is alert and oriented to person, place, and time.  Psychiatric:        Behavior: Behavior normal.           Assessment & Plan:

## 2022-05-14 NOTE — Telephone Encounter (Signed)
Encourage patient to contact the pharmacy for refills or they can request refills through [facility]  (Please schedule appointment if patient has not been seen in over a year) Patient has appt with Dr.Tabori tomorrow morning    WHAT PHARMACY WOULD THEY LIKE THIS SENT TO:  [facility] - [place], [place] - [address]    MEDICATION NAME & DOSE:raloxifene ([place]) 60 MG tablet   NOTES/COMMENTS FROM PATIENT: patient is completely out of medication       Front office please notify patient: It takes 48-72 hours to process rx refill requests Ask patient to call pharmacy to ensure rx is ready before heading there.

## 2022-05-15 ENCOUNTER — Telehealth: Payer: Self-pay | Admitting: Family Medicine

## 2022-05-15 ENCOUNTER — Other Ambulatory Visit: Payer: Self-pay

## 2022-05-15 ENCOUNTER — Ambulatory Visit: Payer: Medicare Other | Admitting: Family Medicine

## 2022-05-15 LAB — CBC WITH DIFFERENTIAL/PLATELET
Basophils Absolute: 0.1 10*3/uL (ref 0.0–0.1)
Basophils Relative: 0.7 % (ref 0.0–3.0)
Eosinophils Absolute: 0.4 10*3/uL (ref 0.0–0.7)
Eosinophils Relative: 4.9 % (ref 0.0–5.0)
HCT: 40.1 % (ref 36.0–46.0)
Hemoglobin: 13.3 g/dL (ref 12.0–15.0)
Lymphocytes Relative: 30.1 % (ref 12.0–46.0)
Lymphs Abs: 2.2 10*3/uL (ref 0.7–4.0)
MCHC: 33.2 g/dL (ref 30.0–36.0)
MCV: 94.4 fl (ref 78.0–100.0)
Monocytes Absolute: 0.7 10*3/uL (ref 0.1–1.0)
Monocytes Relative: 10.1 % (ref 3.0–12.0)
Neutro Abs: 4 10*3/uL (ref 1.4–7.7)
Neutrophils Relative %: 54.2 % (ref 43.0–77.0)
Platelets: 261 10*3/uL (ref 150.0–400.0)
RBC: 4.25 Mil/uL (ref 3.87–5.11)
RDW: 13.5 % (ref 11.5–15.5)
WBC: 7.3 10*3/uL (ref 4.0–10.5)

## 2022-05-15 LAB — HEPATIC FUNCTION PANEL
ALT: 18 U/L (ref 0–35)
AST: 24 U/L (ref 0–37)
Albumin: 4.2 g/dL (ref 3.5–5.2)
Alkaline Phosphatase: 66 U/L (ref 39–117)
Bilirubin, Direct: 0 mg/dL (ref 0.0–0.3)
Total Bilirubin: 0.3 mg/dL (ref 0.2–1.2)
Total Protein: 6.9 g/dL (ref 6.0–8.3)

## 2022-05-15 LAB — LIPID PANEL
Cholesterol: 132 mg/dL (ref 0–200)
HDL: 55.7 mg/dL (ref >39.00)
LDL Cholesterol: 46 mg/dL (ref 0–99)
NonHDL: 76.72
Total CHOL/HDL Ratio: 2
Triglycerides: 153 mg/dL — ABNORMAL HIGH (ref 0.0–149.0)
VLDL: 30.6 mg/dL (ref 0.0–40.0)

## 2022-05-15 LAB — BASIC METABOLIC PANEL
BUN: 22 mg/dL (ref 6–23)
CO2: 27 mEq/L (ref 19–32)
Calcium: 9.3 mg/dL (ref 8.4–10.5)
Chloride: 105 mEq/L (ref 96–112)
Creatinine, Ser: 0.86 mg/dL (ref 0.40–1.20)
GFR: 65.16 mL/min (ref >60.00)
Glucose, Bld: 86 mg/dL (ref 70–99)
Potassium: 4.2 mEq/L (ref 3.5–5.1)
Sodium: 139 mEq/L (ref 135–145)

## 2022-05-15 LAB — VITAMIN D 25 HYDROXY (VIT D DEFICIENCY, FRACTURES): VITD: 84.85 ng/mL (ref 30.00–100.00)

## 2022-05-15 LAB — TSH: TSH: 2.55 u[IU]/mL (ref 0.35–5.50)

## 2022-05-15 MED ORDER — RALOXIFENE HCL 60 MG PO TABS: 60.0000 mg | ORAL_TABLET | Freq: Every day | ORAL | 1 refills | Status: DC

## 2022-05-15 NOTE — Telephone Encounter (Signed)
Encourage patient to contact the pharmacy for refills or they can request refills through [facility]  (Please schedule appointment if patient has not been seen in over a year)    WHAT PHARMACY WOULD THEY LIKE THIS SENT TO:  [facility] - [place], [place] - [address]    [place] ([place]) 60 MG tablet   NOTES/COMMENTS FROM PATIENT:      [place] office please notify patient: It takes 48-72 hours to process rx refill requests Ask patient to call pharmacy to ensure rx is ready before heading there.

## 2022-05-15 NOTE — Telephone Encounter (Signed)
Refill has been sent in.  

## 2022-05-26 ENCOUNTER — Other Ambulatory Visit: Payer: Self-pay | Admitting: Family Medicine

## 2022-05-28 DIAGNOSIS — H04123 Dry eye syndrome of bilateral lacrimal glands: Secondary | ICD-10-CM | POA: Diagnosis not present

## 2022-05-28 DIAGNOSIS — H02052 Trichiasis without entropian right lower eyelid: Secondary | ICD-10-CM | POA: Diagnosis not present

## 2022-05-29 ENCOUNTER — Other Ambulatory Visit: Payer: Self-pay | Admitting: Family Medicine

## 2022-06-04 ENCOUNTER — Encounter: Payer: Self-pay | Admitting: Family Medicine

## 2022-06-04 DIAGNOSIS — M1611 Unilateral primary osteoarthritis, right hip: Secondary | ICD-10-CM | POA: Diagnosis not present

## 2022-06-05 DIAGNOSIS — H25012 Cortical age-related cataract, left eye: Secondary | ICD-10-CM | POA: Diagnosis not present

## 2022-06-05 DIAGNOSIS — H269 Unspecified cataract: Secondary | ICD-10-CM | POA: Diagnosis not present

## 2022-06-05 DIAGNOSIS — H2512 Age-related nuclear cataract, left eye: Secondary | ICD-10-CM | POA: Diagnosis not present

## 2022-06-05 DIAGNOSIS — H25042 Posterior subcapsular polar age-related cataract, left eye: Secondary | ICD-10-CM | POA: Diagnosis not present

## 2022-06-05 DIAGNOSIS — H25812 Combined forms of age-related cataract, left eye: Secondary | ICD-10-CM | POA: Diagnosis not present

## 2022-06-05 DIAGNOSIS — H52222 Regular astigmatism, left eye: Secondary | ICD-10-CM | POA: Diagnosis not present

## 2022-06-11 DIAGNOSIS — Z6829 Body mass index (BMI) 29.0-29.9, adult: Secondary | ICD-10-CM | POA: Diagnosis not present

## 2022-06-11 DIAGNOSIS — Z01419 Encounter for gynecological examination (general) (routine) without abnormal findings: Secondary | ICD-10-CM | POA: Diagnosis not present

## 2022-06-15 NOTE — Assessment & Plan Note (Signed)
Pt has gained 5 lbs since last visit which has pushed BMI over 30- 30.26.  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  Check labs to risk stratify.  Will follow.

## 2022-06-15 NOTE — Assessment & Plan Note (Signed)
Chronic problem.  On Lipitor  daily and Zetia  daily.  Currently asymptomatic.  Check labs.  Adjust meds prn.

## 2022-06-15 NOTE — Assessment & Plan Note (Signed)
Chronic problem.  DEXA improved from Osteoporosis to Osteopenia on Raloxifene.  Check Vit D and replete prn.

## 2022-07-01 ENCOUNTER — Telehealth: Payer: Self-pay | Admitting: Family Medicine

## 2022-07-01 NOTE — Telephone Encounter (Signed)
Contacted Denise Ewing to schedule their annual wellness visit. Appointment made for 07/03/2022.  Thank you,  [facility] Direct dial  [phone]

## 2022-07-03 ENCOUNTER — Ambulatory Visit (INDEPENDENT_AMBULATORY_CARE_PROVIDER_SITE_OTHER): Payer: Medicare Other | Admitting: *Deleted

## 2022-07-03 DIAGNOSIS — Z0000 Encounter for general adult medical examination without abnormal findings: Secondary | ICD-10-CM | POA: Diagnosis not present

## 2022-07-03 NOTE — Patient Instructions (Signed)
Denise Ewing , Thank you for taking time to come for your Medicare Wellness Visit. I appreciate your ongoing commitment to your health goals. Please review the following plan we discussed and let me know if I can assist you in the future.   Screening recommendations/referrals: Colonoscopy: up to date Mammogram: Education provided Bone Density: up to date Recommended yearly ophthalmology/optometry visit for glaucoma screening and checkup Recommended yearly dental visit for hygiene and checkup  Vaccinations: Influenza vaccine: up tod ate Pneumococcal vaccine: up to date Tdap vaccine: Education provided Shingles vaccine: up to date          Preventive Care 65 Years and Older, Female Preventive care refers to lifestyle choices and visits with your health care provider that can promote health and wellness. What does preventive care include? A yearly physical exam. This is also called an annual well check. Dental exams once or twice a year. Routine eye exams. Ask your health care provider how often you should have your eyes checked. Personal lifestyle choices, including: Daily care of your teeth and gums. Regular physical activity. Eating a healthy diet. Avoiding tobacco and drug use. Limiting alcohol use. Practicing safe sex. Taking low-dose aspirin every day. Taking vitamin and mineral supplements as recommended by your health care provider. What happens during an annual well check? The services and screenings done by your health care provider during your annual well check will depend on your age, overall health, lifestyle risk factors, and family history of disease. Counseling  Your health care provider may ask you questions about your: Alcohol use. Tobacco use. Drug use. Emotional well-being. Home and relationship well-being. Sexual activity. Eating habits. History of falls. Memory and ability to understand (cognition). Work and work Astronomer. Reproductive  health. Screening  You may have the following tests or measurements: Height, weight, and BMI. Blood pressure. Lipid and cholesterol levels. These may be checked every 5 years, or more frequently if you are over 86 years old. Skin check. Lung cancer screening. You may have this screening every year starting at age 53 if you have a 30-pack-year history of smoking and currently smoke or have quit within the past 15 years. Fecal occult blood test (FOBT) of the stool. You may have this test every year starting at age 87. Flexible sigmoidoscopy or colonoscopy. You may have a sigmoidoscopy every 5 years or a colonoscopy every 10 years starting at age 57. Hepatitis C blood test. Hepatitis B blood test. Sexually transmitted disease (STD) testing. Diabetes screening. This is done by checking your blood sugar (glucose) after you have not eaten for a while (fasting). You may have this done every 1-3 years. Bone density scan. This is done to screen for osteoporosis. You may have this done starting at age 63. Mammogram. This may be done every 1-2 years. Talk to your health care provider about how often you should have regular mammograms. Talk with your health care provider about your test results, treatment options, and if necessary, the need for more tests. Vaccines  Your health care provider may recommend certain vaccines, such as: Influenza vaccine. This is recommended every year. Tetanus, diphtheria, and acellular pertussis (Tdap, Td) vaccine. You may need a Td booster every 10 years. Zoster vaccine. You may need this after age 2. Pneumococcal 13-valent conjugate (PCV13) vaccine. One dose is recommended after age 42. Pneumococcal polysaccharide (PPSV23) vaccine. One dose is recommended after age 78. Talk to your health care provider about which screenings and vaccines you need and how often you need  them. This information is not intended to replace advice given to you by your health care provider.  Make sure you discuss any questions you have with your health care provider. Document Released: 03/09/2015 Document Revised: 10/31/2015 Document Reviewed: 12/12/2014 Elsevier Interactive Patient Education  2017 [place] Prevention in the Home Falls can cause injuries. They can happen to people of all ages. There are many things you can do to make your home safe and to help prevent falls. What can I do on the outside of my home? Regularly fix the edges of walkways and driveways and fix any cracks. Remove anything that might make you trip as you walk through a door, such as a raised step or threshold. Trim any bushes or trees on the path to your home. Use bright outdoor lighting. Clear any walking paths of anything that might make someone trip, such as rocks or tools. Regularly check to see if handrails are loose or broken. Make sure that both sides of any steps have handrails. Any raised decks and porches should have guardrails on the edges. Have any leaves, snow, or ice cleared regularly. Use sand or salt on walking paths during winter. Clean up any spills in your garage right away. This includes oil or grease spills. What can I do in the bathroom? Use night lights. Install grab bars by the toilet and in the tub and shower. Do not use towel bars as grab bars. Use non-skid mats or decals in the tub or shower. If you need to sit down in the shower, use a plastic, non-slip stool. Keep the floor dry. Clean up any water that spills on the floor as soon as it happens. Remove soap buildup in the tub or shower regularly. Attach bath mats securely with double-sided non-slip rug tape. Do not have throw rugs and other things on the floor that can make you trip. What can I do in the bedroom? Use night lights. Make sure that you have a light by your bed that is easy to reach. Do not use any sheets or blankets that are too big for your bed. They should not hang down onto the floor. Have a  firm chair that has side arms. You can use this for support while you get dressed. Do not have throw rugs and other things on the floor that can make you trip. What can I do in the kitchen? Clean up any spills right away. Avoid walking on wet floors. Keep items that you use a lot in easy-to-reach places. If you need to reach something above you, use a strong step stool that has a grab bar. Keep electrical cords out of the way. Do not use floor polish or wax that makes floors slippery. If you must use wax, use non-skid floor wax. Do not have throw rugs and other things on the floor that can make you trip. What can I do with my stairs? Do not leave any items on the stairs. Make sure that there are handrails on both sides of the stairs and use them. Fix handrails that are broken or loose. Make sure that handrails are as long as the stairways. Check any carpeting to make sure that it is firmly attached to the stairs. Fix any carpet that is loose or worn. Avoid having throw rugs at the top or bottom of the stairs. If you do have throw rugs, attach them to the floor with carpet tape. Make sure that you have a light switch at  the top of the stairs and the bottom of the stairs. If you do not have them, ask someone to add them for you. What else can I do to help prevent falls? Wear shoes that: Do not have high heels. Have rubber bottoms. Are comfortable and fit you well. Are closed at the toe. Do not wear sandals. If you use a stepladder: Make sure that it is fully opened. Do not climb a closed stepladder. Make sure that both sides of the stepladder are locked into place. Ask someone to hold it for you, if possible. Clearly mark and make sure that you can see: Any grab bars or handrails. First and last steps. Where the edge of each step is. Use tools that help you move around (mobility aids) if they are needed. These include: Canes. Walkers. Scooters. Crutches. Turn on the lights when you  go into a dark area. Replace any light bulbs as soon as they burn out. Set up your furniture so you have a clear path. Avoid moving your furniture around. If any of your floors are uneven, fix them. If there are any pets around you, be aware of where they are. Review your medicines with your doctor. Some medicines can make you feel dizzy. This can increase your chance of falling. Ask your doctor what other things that you can do to help prevent falls. This information is not intended to replace advice given to you by your health care provider. Make sure you discuss any questions you have with your health care provider. Document Released: 12/07/2008 Document Revised: 07/19/2015 Document Reviewed: 03/17/2014 Elsevier Interactive Patient Education  2017 [employer].

## 2022-07-03 NOTE — Progress Notes (Signed)
Subjective:   Denise Ewing is a 77 y.o. female who presents for Medicare Annual (Subsequent) preventive examination.  I connected with  Denise Ewing on 07/03/22 by a telephone enabled telemedicine application and verified that I am speaking with the correct person using two identifiers.   I discussed the limitations of evaluation and management by telemedicine. The patient expressed understanding and agreed to proceed.  Patient location: home  Provider location: telephone/home    Review of Systems     Cardiac Risk Factors include: advanced age (>49men, >74 women)     Objective:    There were no vitals filed for this visit. There is no height or weight on file to calculate BMI.     07/03/2022    3:04 PM 03/07/2021    2:21 PM 02/27/2020    2:18 PM 10/26/2016    8:02 PM 01/05/2014    8:57 PM  Advanced Directives  Does Patient Have a Medical Advance Directive? No Yes Yes No Yes  Type of Special educational needs teacher of [place];Living will Living will  Healthcare Power of Attorney  Does patient want to make changes to medical advance directive?     No - Patient declined  Copy of Healthcare Power of Attorney in Chart?  No - copy requested   No - copy requested  Would patient like information on creating a medical advance directive? No - Patient declined   No - Patient declined     Current Medications (verified) Outpatient Encounter Medications as of 07/03/2022  Medication Sig   aspirin 81 MG tablet Take 81 mg by mouth daily.   atorvastatin (LIPITOR) 10 MG tablet TAKE 1 TABLET BY MOUTH EVERY DAY   Calcium Citrate-Vitamin D 250-200 MG-UNIT TABS Take 2 tablets by mouth at bedtime.   Coenzyme Q10 (CO Q-10 PO) Take 1 capsule by mouth every morning.   conjugated estrogens (PREMARIN) vaginal cream Place 1.5 g vaginally once a week. Take one tablet by mouth on Sundays per patient   dorzolamide-timolol (COSOPT) 22.3-6.8 MG/ML ophthalmic solution 1 drop 2 (two) times daily.   ezetimibe  (ZETIA) 10 MG tablet TAKE 1/2 TABLET BY MOUTH EVERY DAY   famotidine (PEPCID) 20 MG tablet Take 20 mg by mouth daily.   GLUCOSAMINE CHONDROITIN COMPLX PO Take 2 tablets by mouth daily.    meloxicam (MOBIC) 7.5 MG tablet Take one daily   metoprolol succinate (TOPROL XL) 25 MG 24 hr tablet Take 0.5 tablets (12.5 mg total) by mouth daily.   Multiple Vitamins-Minerals (ALIVE WOMENS 50+ PO) Take 1 tablet by mouth every morning.   pantoprazole (PROTONIX) 40 MG tablet Take 1 tablet (40 mg total) by mouth 2 (two) times daily.   raloxifene (EVISTA) 60 MG tablet Take 1 tablet (60 mg total) by mouth daily.   TRAVATAN Z 0.004 % SOLN ophthalmic solution Place 1 drop into both eyes at bedtime.    No facility-administered encounter medications on file as of 07/03/2022.    Allergies (verified) Amoxicillin and Albuterol   History: Past Medical History:  Diagnosis Date   Allergy    post nasal drip   Arthritis    Blood transfusion without reported diagnosis    with hysterectomy 11 years ago   GERD (gastroesophageal reflux disease)    Glaucoma    bilateral eyes   History of colonic polyps    Melanoma of thigh (HCC)    Osteoporosis    Past Surgical History:  Procedure Laterality Date   Colon polyps removed  Melanoma removed     R thigh   TOTAL ABDOMINAL HYSTERECTOMY W/ BILATERAL SALPINGOOPHORECTOMY     uterine fibroids   Family History  Problem Relation Age of Onset   Hypertension Mother    Cancer Mother        breast,anal   Osteoporosis Mother    Breast cancer Mother 90   Rectal cancer Mother    CAD Sister    Arrhythmia Sister    Hypothyroidism Sister    Arrhythmia Brother    Colon polyps Neg Hx    Esophageal cancer Neg Hx    Stomach cancer Neg Hx    Social History   Socioeconomic History   Marital status: Married    Spouse name: Not on file   Number of children: 2   Years of education: Not on file   Highest education level: Some college, no degree  Occupational History    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  Tobacco Use   Smoking status: Never   Smokeless tobacco: Never  Vaping Use   Vaping Use: Never used  Substance and Sexual Activity   Alcohol use: Yes    Comment:  occasionally   Drug use: No   Sexual activity: Not Currently  Other Topics Concern   Not on file  Social History Narrative   Not on file   Social Determinants of Health   Financial Resource Strain: Low Risk  (07/03/2022)   Overall Financial Resource Strain (CARDIA)    Difficulty of Paying Living Expenses: Not hard at all  Food Insecurity: No Food Insecurity (07/03/2022)   Hunger Vital Sign    Worried About Running Out of Food in the Last Year: Never true    Ran Out of Food in the Last Year: Never true  Transportation Needs: No Transportation Needs (07/03/2022)   PRAPARE - Administrator, Civil Service (Medical): No    Lack of Transportation (Non-Medical): No  Physical Activity: Inactive (07/03/2022)   Exercise Vital Sign    Days of Exercise per Week: 0 days    Minutes of Exercise per Session: 0 min  Stress: Stress Concern Present (07/03/2022)   [employer] of Occupational Health - Occupational Stress Questionnaire    Feeling of Stress : Rather much  Social Connections: Moderately Isolated (07/03/2022)   Social Connection and Isolation Panel [NHANES]    Frequency of Communication with Friends and Family: More than three times a week    Frequency of Social Gatherings with Friends and Family: Three times a week    Attends Religious Services: Never    Active Member of Clubs or Organizations: No    Attends Engineer, structural: Never    Marital Status: Married    Tobacco Counseling Counseling given: Not Answered   Clinical Intake:  Pre-visit preparation completed: Yes  Pain : No/denies pain        How often do you need to have someone help you when you read instructions, pamphlets, or other written materials from your doctor or pharmacy?: 1 -  Never  Diabetic?  no  Interpreter Needed?: No  Information entered by :: Remi Haggard LPN   Activities of Daily Living    07/03/2022    3:18 PM 07/02/2022    4:32 PM  In your present state of health, do you have any difficulty performing the following activities:  Hearing? 0 0  Vision? 0 0  Difficulty concentrating or making decisions? 0   Walking or climbing stairs? 0 0  Dressing or bathing? 0 0  Doing errands, shopping? 0 0  Preparing Food and eating ? N N  Using the Toilet? N N  In the past six months, have you accidently leaked urine? N N  Do you have problems with loss of bowel control? N N  Managing your Medications? N N  Managing your Finances? N N  Housekeeping or managing your Housekeeping? N N    Patient Care Team: Sheliah Hatch, MD as PCP - General Pricilla Riffle, MD as PCP - Cardiology (Cardiology) Mitchel Honour, DO as Consulting Physician (Obstetrics and Gynecology) Janet Berlin, MD as Consulting Physician (Ophthalmology) Rachael Fee, MD as Attending Physician (Gastroenterology) Erroll Luna, [facility] (Pharmacist)  Indicate any recent Medical Services you may have received from other than [facility] providers in the past year (date may be approximate).     Assessment:   This is a routine wellness examination for [place].  Hearing/Vision screen Hearing Screening - Comments:: No trouble hearing Vision Screening - Comments:: Up to date Cataract surgery   [facility]  Dietary issues and exercise activities discussed: Current Exercise Habits: The patient does not participate in regular exercise at present   Goals Addressed             This Visit's Progress    Weight (lb) < 200 lb (90.7 kg)         Depression Screen    07/03/2022    3:09 PM 05/14/2022    1:35 PM 03/11/2021    1:54 PM 03/07/2021    2:22 PM 03/07/2021    2:19 PM 01/03/2021    8:58 AM 12/31/2020    4:36 PM  PHQ 2/9 Scores  PHQ - 2 Score 2 0 1 0 0 0 0  PHQ- 9 Score  8 0 4   0 0    Fall Risk    07/03/2022    3:02 PM 07/02/2022    4:32 PM 05/14/2022    1:36 PM 03/11/2021    1:55 PM 03/07/2021    2:22 PM  Fall Risk   Falls in the past year? 0 0 0 0 0  Number falls in past yr: 0  0 0 0  Injury with Fall? 0  0 0 0  Risk for fall due to :   No Fall Risks No Fall Risks   Follow up Falls evaluation completed;Education provided;Falls prevention discussed  Falls evaluation completed Falls evaluation completed Falls evaluation completed    FALL RISK PREVENTION PERTAINING TO THE HOME:  Any stairs in or around the home? No  If so, are there any without handrails? No  Home free of loose throw rugs in walkways, pet beds, electrical cords, etc? Yes  Adequate lighting in your home to reduce risk of falls? Yes   ASSISTIVE DEVICES UTILIZED TO PREVENT FALLS:  Life alert? No  Use of a cane, walker or w/c? No  Grab bars in the bathroom? No  Shower chair or bench in shower? Yes  Elevated toilet seat or a handicapped toilet? No   TIMED UP AND GO:  Was the test performed? No .    Cognitive Function:        07/03/2022    3:04 PM  6CIT Screen  What Year? 0 points  What month? 0 points  What time? 0 points  Count back from 20 0 points  Months in reverse 0 points  Repeat phrase 0 points  Total Score 0 points    Immunizations  Immunization History  Administered Date(s) Administered   Fluad Quad(high Dose 65+) 11/04/2019, 11/16/2020   Influenza Split 12/16/2011   Influenza Whole 12/05/2009, 11/04/2010   Influenza, High Dose Seasonal PF 11/30/2012, 10/30/2018   Influenza,inj,Quad PF,6+ Mos 11/15/2013, 11/28/2014, 10/15/2015, 12/16/2016, 12/03/2017   Influenza-Unspecified 10/25/2012, 10/09/2018   PFIZER(Purple Top)SARS-COV-2 Vaccination 04/01/2019, 04/26/2019, 11/22/2019   Pneumococcal Conjugate-13 01/11/2014   Pneumococcal Polysaccharide-23 09/05/2011   Pneumococcal-Unspecified 12/08/2017   Tdap 02/09/2007   Zoster Recombinat (Shingrix) 08/22/2017,  10/30/2017   Zoster, Live 12/08/2017    TDAP status: Due, Education has been provided regarding the importance of this vaccine. Advised may receive this vaccine at local pharmacy or Health Dept. Aware to provide a copy of the vaccination record if obtained from local pharmacy or Health Dept. Verbalized acceptance and understanding.  Flu Vaccine status: Up to date  Pneumococcal vaccine status: Up to date  Covid-19 vaccine status: Information provided on how to obtain vaccines.   Qualifies for Shingles Vaccine? No   Zostavax completed Yes   Shingrix Completed?: Yes  Screening Tests Health Maintenance  Topic Date Due   MAMMOGRAM  03/22/2022   INFLUENZA VACCINE  09/25/2022   DEXA SCAN  03/23/2023   Medicare Annual Wellness (AWV)  07/03/2023   COLONOSCOPY (Pts 45-42yrs Insurance coverage will need to be confirmed)  02/06/2027   Pneumonia Vaccine 45+ Years old  Completed   Zoster Vaccines- Shingrix  Completed   HPV VACCINES  Aged Out   DTaP/Tdap/Td  Discontinued   COVID-19 Vaccine  Discontinued   Hepatitis C Screening  Discontinued    Health Maintenance  Health Maintenance Due  Topic Date Due   MAMMOGRAM  03/22/2022    Colorectal cancer screening: Type of screening: Colonoscopy. Completed 2023. Repeat every 5 years  Mammogram  will schedule  Bone Density status: Completed 2023. Results reflect: Bone density results: OSTEOPOROSIS. Repeat every 2 years.  Lung Cancer Screening: (Low Dose CT Chest recommended if Age 84-80 years, 30 pack-year currently smoking OR have quit w/in 15years.) does not qualify.   Lung Cancer Screening Referral:   Additional Screening:  Hepatitis C Screening  never done  Vision Screening: Recommended annual ophthalmology exams for early detection of glaucoma and other disorders of the eye. Is the patient up to date with their annual eye exam?  Yes  Who is the provider or what is the name of the office in which the patient attends annual eye  exams? [facility] If pt is not established with a provider, would they like to be referred to a provider to establish care? No .   Dental Screening: Recommended annual dental exams for proper oral hygiene  Community Resource Referral / Chronic Care Management: CRR required this visit?  No   CCM required this visit?  No      Plan:     I have personally reviewed and noted the following in the patient's chart:   Medical and social history Use of alcohol, tobacco or illicit drugs  Current medications and supplements including opioid prescriptions. Patient is not currently taking opioid prescriptions. Functional ability and status Nutritional status Physical activity Advanced directives List of other physicians Hospitalizations, surgeries, and ER visits in previous 12 months Vitals Screenings to include cognitive, depression, and falls Referrals and appointments  In addition, I have reviewed and discussed with patient certain preventive protocols, quality metrics, and best practice recommendations. A written personalized care plan for preventive services as well as general preventive health recommendations were provided to patient.  Remi Haggard, LPN   10/30/6211   Nurse Notes:   Patient is feeling very stressed due to family members and Medical problems.    Will contact MD if she feels she cannot cope.

## 2022-07-24 DIAGNOSIS — G894 Chronic pain syndrome: Secondary | ICD-10-CM | POA: Diagnosis not present

## 2022-07-24 DIAGNOSIS — M47816 Spondylosis without myelopathy or radiculopathy, lumbar region: Secondary | ICD-10-CM | POA: Diagnosis not present

## 2022-07-24 DIAGNOSIS — M5136 Other intervertebral disc degeneration, lumbar region: Secondary | ICD-10-CM | POA: Diagnosis not present

## 2022-08-12 DIAGNOSIS — M438X6 Other specified deforming dorsopathies, lumbar region: Secondary | ICD-10-CM | POA: Diagnosis not present

## 2022-08-12 DIAGNOSIS — M4807 Spinal stenosis, lumbosacral region: Secondary | ICD-10-CM | POA: Diagnosis not present

## 2022-08-12 DIAGNOSIS — M48061 Spinal stenosis, lumbar region without neurogenic claudication: Secondary | ICD-10-CM | POA: Diagnosis not present

## 2022-08-12 DIAGNOSIS — M5136 Other intervertebral disc degeneration, lumbar region: Secondary | ICD-10-CM | POA: Diagnosis not present

## 2022-08-14 ENCOUNTER — Other Ambulatory Visit: Payer: Self-pay | Admitting: Gastroenterology

## 2022-08-14 ENCOUNTER — Encounter: Payer: Self-pay | Admitting: Family Medicine

## 2022-08-14 DIAGNOSIS — Z1231 Encounter for screening mammogram for malignant neoplasm of breast: Secondary | ICD-10-CM

## 2022-08-15 ENCOUNTER — Other Ambulatory Visit: Payer: Self-pay

## 2022-08-15 ENCOUNTER — Telehealth: Payer: Self-pay | Admitting: Family Medicine

## 2022-08-15 DIAGNOSIS — Z1231 Encounter for screening mammogram for malignant neoplasm of breast: Secondary | ICD-10-CM

## 2022-08-15 MED ORDER — MELOXICAM 7.5 MG PO TABS: 7.5000 mg | ORAL_TABLET | Freq: Every day | ORAL | 3 refills | Status: DC

## 2022-08-15 NOTE — Telephone Encounter (Signed)
Pt is aware.  

## 2022-08-15 NOTE — Telephone Encounter (Signed)
Refill sent as requested. 

## 2022-08-15 NOTE — Telephone Encounter (Signed)
Pt informed

## 2022-08-15 NOTE — Telephone Encounter (Signed)
Encourage patient to contact the pharmacy for refills or they can request refills through [facility]   WHAT PHARMACY WOULD THEY LIKE THIS SENT TO:  [facility] - [place], [place] - [address]   MEDICATION NAME & DOSE: meloxicam (MOBIC) 7.5 MG tablet  NOTES/COMMENTS FROM PATIENT:      Front office please notify patient: It takes 48-72 hours to process rx refill requests Ask patient to call pharmacy to ensure rx is ready before heading there.

## 2022-08-15 NOTE — Telephone Encounter (Signed)
Pt has been notified.

## 2022-08-18 ENCOUNTER — Other Ambulatory Visit: Payer: Self-pay | Admitting: Family Medicine

## 2022-08-19 NOTE — Telephone Encounter (Signed)
Is it to send this rx listed below in ? Pharmacy states they have not gotten the Rx

## 2022-08-19 NOTE — Telephone Encounter (Signed)
Pt aware of refill.

## 2022-08-21 ENCOUNTER — Other Ambulatory Visit: Payer: Self-pay

## 2022-08-22 ENCOUNTER — Ambulatory Visit: Payer: Medicare Other | Admitting: Internal Medicine

## 2022-09-25 DIAGNOSIS — M47816 Spondylosis without myelopathy or radiculopathy, lumbar region: Secondary | ICD-10-CM | POA: Diagnosis not present

## 2022-09-25 DIAGNOSIS — M461 Sacroiliitis, not elsewhere classified: Secondary | ICD-10-CM | POA: Diagnosis not present

## 2022-09-25 DIAGNOSIS — G894 Chronic pain syndrome: Secondary | ICD-10-CM | POA: Diagnosis not present

## 2022-09-30 DIAGNOSIS — R92333 Mammographic heterogeneous density, bilateral breasts: Secondary | ICD-10-CM | POA: Diagnosis not present

## 2022-09-30 DIAGNOSIS — R928 Other abnormal and inconclusive findings on diagnostic imaging of breast: Secondary | ICD-10-CM | POA: Diagnosis not present

## 2022-09-30 LAB — HM MAMMOGRAPHY

## 2022-10-09 ENCOUNTER — Encounter (INDEPENDENT_AMBULATORY_CARE_PROVIDER_SITE_OTHER): Payer: Self-pay

## 2022-10-13 NOTE — Progress Notes (Unsigned)
Cardiology Office Note   Date:  10/14/2022   ID:  Denise Ewing, DOB 20-Oct-1945, MRN [id number]  PCP:  Sheliah Hatch, MD  Cardiologist:   Dietrich Pates, MD   FU of HL and CAD      History of Present Illness: Denise Ewing is a 77 y.o. female with a history of atypical CP, CAD by CTA in 2015 (Ca score 126; Less than 50% stenosis LAD) Pt  has also complained of palpitaitons.   Monitor showed SR, short bursts of SVT  REcomm trial of metoprolol  She did not start.        I saw the pt in Dec 2022  Since then she says her breathing is OK   She denies CP    She denies palpitations.     Notes increased stress   Had cataract surgery in April 2024  Several family members with health problems      Current Meds  Medication Sig   aspirin 81 MG tablet Take 81 mg by mouth daily.   atorvastatin (LIPITOR) 10 MG tablet TAKE 1 TABLET BY MOUTH EVERY DAY   Calcium Citrate-Vitamin D 250-200 MG-UNIT TABS Take 2 tablets by mouth at bedtime.   Coenzyme Q10 (CO Q-10 PO) Take 1 capsule by mouth every morning.   conjugated estrogens (PREMARIN) vaginal cream Place 1.5 g vaginally once a week. Take one tablet by mouth on Sundays per patient   dorzolamide-timolol (COSOPT) 22.3-6.8 MG/ML ophthalmic solution 1 drop 2 (two) times daily.   ezetimibe (ZETIA) 10 MG tablet TAKE 1/2 TABLET BY MOUTH EVERY DAY   famotidine (PEPCID) 20 MG tablet Take 20 mg by mouth daily.   GLUCOSAMINE CHONDROITIN COMPLX PO Take 2 tablets by mouth daily.    meloxicam (MOBIC) 7.5 MG tablet Take 1 tablet (7.5 mg total) by mouth daily. Take one daily   metoprolol succinate (TOPROL XL) 25 MG 24 hr tablet Take 0.5 tablets (12.5 mg total) by mouth daily.   Multiple Vitamins-Minerals (ALIVE WOMENS 50+ PO) Take 1 tablet by mouth every morning.   pantoprazole (PROTONIX) 40 MG tablet TAKE 1 TABLET BY MOUTH 2 TIMES DAILY   raloxifene (EVISTA) 60 MG tablet TAKE 1 TABLET BY MOUTH EVERY DAY   TRAVATAN Z 0.004 % SOLN ophthalmic solution Place 1 drop  into both eyes at bedtime.      Allergies:   Amoxicillin and Albuterol   Past Medical History:  Diagnosis Date   Allergy    post nasal drip   Arthritis    Blood transfusion without reported diagnosis    with hysterectomy 11 years ago   GERD (gastroesophageal reflux disease)    Glaucoma    bilateral eyes   History of colonic polyps    Melanoma of thigh (HCC)    Osteoporosis     Past Surgical History:  Procedure Laterality Date   Colon polyps removed     Melanoma removed     R thigh   TOTAL ABDOMINAL HYSTERECTOMY W/ BILATERAL SALPINGOOPHORECTOMY     uterine fibroids     Social History:  The patient  reports that she has never smoked. She has never used smokeless tobacco. She reports current alcohol use. She reports that she does not use drugs.   Family History:  The patient's family history includes Arrhythmia in her brother and sister; Breast cancer (age of onset: 35) in her mother; CAD in her sister; Cancer in her mother; Hypertension in her mother; Hypothyroidism in her sister; Osteoporosis in her  mother; Rectal cancer in her mother.    ROS:  Please see the history of present illness. All other systems are reviewed and  Negative to the above problem except as noted.    PHYSICAL EXAM: VS:  BP (!) 148/74   Pulse 68   Ht 5\' 1"  (1.549 m)   Wt 160 lb (72.6 kg)   SpO2 95%   BMI 30.23 kg/m   On my check BP 122/64    GEN: Pt is a 77 yo in no acute distress  HEENT: normal  Neck: JVP is normal  No carotid bruit Cardiac: RRR; no murmur  No  LE edema  Respiratory:  clear to auscultation GI: soft, nontender,  No hepatomegaly   EKG:  EKG is ordered today.    NSR 70 bpm   Lipid Panel    Component Value Date/Time   CHOL 132 05/14/2022 1417   CHOL 110 04/10/2016 1143   CHOL 96 (L) 12/21/2014 0824   TRIG 153.0 (H) 05/14/2022 1417   TRIG 66 12/21/2014 0824   HDL 55.70 05/14/2022 1417   HDL 54 04/10/2016 1143   HDL 45 (L) 12/21/2014 0824   CHOLHDL 2 05/14/2022 1417    VLDL 30.6 05/14/2022 1417   LDLCALC 46 05/14/2022 1417   LDLCALC 45 04/10/2016 1143   LDLCALC 38 12/21/2014 0824      Wt Readings from Last 3 Encounters:  10/14/22 160 lb (72.6 kg)  05/14/22 160 lb 2 oz (72.6 kg)  02/05/22 153 lb (69.4 kg)      ASSESSMENT AND PLAN:   1  CAD Pt with mild dz by CT scan in 2015   Remains asymptomatic     2  Palpitations   Pt denies     3 HL  Last LDL 46  HDL 56   (March 2024)  4  HTN   BP is controlled on recheck today     Current medicines are reviewed at length with the patient today.  The patient does not have concerns regarding medicines.  Signed, Dietrich Pates, MD  10/14/2022 10:51 PM    [facility] [address] [place], [place], [place]  [address] Phone: [phone]; Fax: [phone]

## 2022-10-14 ENCOUNTER — Ambulatory Visit: Payer: Medicare Other | Attending: Internal Medicine | Admitting: Internal Medicine

## 2022-10-14 ENCOUNTER — Encounter: Payer: Self-pay | Admitting: Internal Medicine

## 2022-10-14 VITALS — BP 148/74 | HR 68 | Ht 61.0 in | Wt 160.0 lb

## 2022-10-14 DIAGNOSIS — R002 Palpitations: Secondary | ICD-10-CM

## 2022-10-14 NOTE — Patient Instructions (Signed)
Medication Instructions:   *If you need a refill on your cardiac medications before your next appointment, please call your pharmacy*   Lab Work:  If you have labs (blood work) drawn today and your tests are completely normal, you will receive your results only by: MyChart Message (if you have MyChart) OR A paper copy in the mail If you have any lab test that is abnormal or we need to change your treatment, we will call you to review the results.   Testing/Procedures:    Follow-Up: At [facility], you and your health needs are our priority.  As part of our continuing mission to provide you with exceptional heart care, we have created designated Provider Care Teams.  These Care Teams include your primary Cardiologist (physician) and Advanced Practice Providers (APPs -  Physician Assistants and Nurse Practitioners) who all work together to provide you with the care you need, when you need it.  We recommend signing up for the patient portal called "MyChart".  Sign up information is provided on this After Visit Summary.  MyChart is used to connect with patients for Virtual Visits (Telemedicine).  Patients are able to view lab/test results, encounter notes, upcoming appointments, etc.  Non-urgent messages can be sent to your provider as well.   To learn more about what you can do with MyChart, go to [email or web address].    Your next appointment:   1 year(s)  Provider:   Paula Ross, MD     Other Instructions   

## 2022-10-24 DIAGNOSIS — M461 Sacroiliitis, not elsewhere classified: Secondary | ICD-10-CM | POA: Diagnosis not present

## 2022-11-13 ENCOUNTER — Encounter: Payer: Self-pay | Admitting: Family Medicine

## 2022-11-13 ENCOUNTER — Ambulatory Visit (INDEPENDENT_AMBULATORY_CARE_PROVIDER_SITE_OTHER): Payer: Medicare Other | Admitting: Family Medicine

## 2022-11-13 VITALS — BP 134/60 | HR 65 | Temp 97.4°F | Ht 61.0 in | Wt 155.8 lb

## 2022-11-13 DIAGNOSIS — E785 Hyperlipidemia, unspecified: Secondary | ICD-10-CM | POA: Diagnosis not present

## 2022-11-13 DIAGNOSIS — Z0000 Encounter for general adult medical examination without abnormal findings: Secondary | ICD-10-CM | POA: Diagnosis not present

## 2022-11-13 DIAGNOSIS — M818 Other osteoporosis without current pathological fracture: Secondary | ICD-10-CM

## 2022-11-13 NOTE — Assessment & Plan Note (Signed)
Pt's PE WNL w/ exception of BMI.  She is down 5 lbs and has been working on diet and exercise.  UTD on mammo, DEXA, immunizations.  Check labs.  Anticipatory guidance provided.

## 2022-11-13 NOTE — Assessment & Plan Note (Signed)
UTD on DEXA.  Check Vit D and replete prn.

## 2022-11-13 NOTE — Progress Notes (Signed)
Subjective:    Patient ID: Denise Ewing, female    DOB: 11-27-1945, 77 y.o.   MRN: [id number]  HPI CPE- UTD on DEXA, mammo, colonoscopy, vaccines.   Patient Care Team    Relationship Specialty Notifications Start End  Sheliah Hatch, MD PCP - General   02/06/10   Pricilla Riffle, MD PCP - Cardiology Cardiology Admissions 03/21/19   Mitchel Honour, DO Consulting Physician Obstetrics and Gynecology  04/18/14   Janet Berlin, MD Consulting Physician Ophthalmology  08/02/15   Rachael Fee, MD Attending Physician Gastroenterology  02/09/19   Erroll Luna, [facility] (Inactive)  Pharmacist  01/09/21    Comment: [phone]     Health Maintenance  Topic Date Due   MAMMOGRAM  03/22/2022   DEXA SCAN  03/23/2023   Medicare Annual Wellness (AWV)  07/03/2023   Colonoscopy  02/06/2027   Pneumonia Vaccine 32+ Years old  Completed   INFLUENZA VACCINE  Completed   Zoster Vaccines- Shingrix  Completed   HPV VACCINES  Aged Out   DTaP/Tdap/Td  Discontinued   COVID-19 Vaccine  Discontinued   Hepatitis C Screening  Discontinued      Review of Systems Patient reports no vision/ hearing changes, adenopathy,fever,  persistant/recurrent hoarseness , swallowing issues, chest pain, palpitations, edema, persistant/recurrent cough, hemoptysis, dyspnea (rest/exertional/paroxysmal nocturnal), gastrointestinal bleeding (melena, rectal bleeding), abdominal pain, significant heartburn, bowel changes, GU symptoms (dysuria, hematuria, incontinence), Gyn symptoms (abnormal  bleeding, pain),  syncope, focal weakness, memory loss, numbness & tingling, skin/hair/nail changes, abnormal bruising or bleeding, anxiety, or depression.   + 5 lb weight loss- 'i've been working on it'.    Objective:   Physical Exam General Appearance:    Alert, cooperative, no distress, appears stated age  Head:    Normocephalic, without obvious abnormality, atraumatic  Eyes:    PERRL, conjunctiva/corneas clear, EOM's intact both  eyes  Ears:    Normal TM's and external ear canals, both ears  Nose:   Nares normal, septum midline, mucosa normal, no drainage    or sinus tenderness  Throat:   Lips, mucosa, and tongue normal; teeth and gums normal  Neck:   Supple, symmetrical, trachea midline, no adenopathy;    Thyroid: no enlargement/tenderness/nodules  Back:     Symmetric, no curvature, ROM normal, no CVA tenderness  Lungs:     Clear to auscultation bilaterally, respirations unlabored  Chest Wall:    No tenderness or deformity   Heart:    Regular rate and rhythm, S1 and S2 normal, no murmur, rub   or gallop  Breast Exam:    Deferred to mammo  Abdomen:     Soft, non-tender, bowel sounds active all four quadrants,    no masses, no organomegaly  Genitalia:    Deferred  Rectal:    Extremities:   Extremities normal, atraumatic, no cyanosis or edema  Pulses:   2+ and symmetric all extremities  Skin:   Skin color, texture, turgor normal, no rashes or lesions  Lymph nodes:   Cervical, supraclavicular, and axillary nodes normal  Neurologic:   CNII-XII intact, normal strength, sensation and reflexes    throughout          Assessment & Plan:

## 2022-11-13 NOTE — Patient Instructions (Signed)
Follow up in 6 months to recheck cholesterol We'll notify you of your lab results and make any changes if needed Keep up the good work on healthy diet and regular exercise- you look great! Call with any questions or concerns Stay Safe!  Stay Healthy! Hang in there!!

## 2022-11-13 NOTE — Assessment & Plan Note (Signed)
Chronic problem.  Check labs.  Adjust meds prn

## 2022-11-14 ENCOUNTER — Other Ambulatory Visit: Payer: Self-pay

## 2022-11-14 DIAGNOSIS — E785 Hyperlipidemia, unspecified: Secondary | ICD-10-CM

## 2022-11-14 LAB — LIPID PANEL
Cholesterol: 130 mg/dL (ref 0–200)
HDL: 64.9 mg/dL (ref >39.00)
LDL Cholesterol: 51 mg/dL (ref 0–99)
NonHDL: 65.51
Total CHOL/HDL Ratio: 2
Triglycerides: 71 mg/dL (ref 0.0–149.0)
VLDL: 14.2 mg/dL (ref 0.0–40.0)

## 2022-11-14 LAB — HEPATIC FUNCTION PANEL
ALT: 18 U/L (ref 0–35)
AST: 27 U/L (ref 0–37)
Albumin: 4.3 g/dL (ref 3.5–5.2)
Alkaline Phosphatase: 53 U/L (ref 39–117)
Bilirubin, Direct: 0.1 mg/dL (ref 0.0–0.3)
Total Bilirubin: 0.5 mg/dL (ref 0.2–1.2)
Total Protein: 6.7 g/dL (ref 6.0–8.3)

## 2022-11-14 LAB — VITAMIN D 25 HYDROXY (VIT D DEFICIENCY, FRACTURES): VITD: 94.37 ng/mL (ref 30.00–100.00)

## 2022-11-14 LAB — BASIC METABOLIC PANEL
BUN: 24 mg/dL — ABNORMAL HIGH (ref 6–23)
CO2: 27 mEq/L (ref 19–32)
Calcium: 9.3 mg/dL (ref 8.4–10.5)
Chloride: 101 mEq/L (ref 96–112)
Creatinine, Ser: 0.88 mg/dL (ref 0.40–1.20)
GFR: 63.17 mL/min (ref >60.00)
Glucose, Bld: 74 mg/dL (ref 70–99)
Potassium: 3.9 mEq/L (ref 3.5–5.1)
Sodium: 137 mEq/L (ref 135–145)

## 2022-11-14 LAB — TSH: TSH: 2.26 u[IU]/mL (ref 0.35–5.50)

## 2022-11-14 NOTE — Progress Notes (Signed)
Lab called and patient did not have enough of a sample to run CBC, she has been informed and will go to the [address] location for redraw

## 2022-11-18 ENCOUNTER — Other Ambulatory Visit: Payer: Self-pay

## 2022-11-18 NOTE — Telephone Encounter (Signed)
Patient is requesting a refill of the following medications: Requested Prescriptions   Pending Prescriptions Disp Refills   meloxicam (MOBIC) 7.5 MG tablet 30 tablet 3    Sig: Take 1 tablet (7.5 mg total) by mouth daily. Take one daily    Date of patient request: 11/14/22 Last office visit: 11/13/22 Date of last refill: 08/15/22 Last refill amount: 30 Follow up time period per chart: 6 months

## 2022-11-19 ENCOUNTER — Other Ambulatory Visit: Payer: Self-pay

## 2022-11-19 MED ORDER — EZETIMIBE 10 MG PO TABS: 5.0000 mg | ORAL_TABLET | Freq: Every day | ORAL | 3 refills | Status: DC

## 2022-11-19 MED ORDER — MELOXICAM 7.5 MG PO TABS: 7.5000 mg | ORAL_TABLET | Freq: Every day | ORAL | 3 refills | Status: DC

## 2022-11-19 MED ORDER — ATORVASTATIN CALCIUM 10 MG PO TABS: 10.0000 mg | ORAL_TABLET | Freq: Every day | ORAL | 3 refills | Status: DC

## 2022-11-26 DIAGNOSIS — H2511 Age-related nuclear cataract, right eye: Secondary | ICD-10-CM | POA: Diagnosis not present

## 2022-11-26 DIAGNOSIS — H0100A Unspecified blepharitis right eye, upper and lower eyelids: Secondary | ICD-10-CM | POA: Diagnosis not present

## 2022-11-26 DIAGNOSIS — H401123 Primary open-angle glaucoma, left eye, severe stage: Secondary | ICD-10-CM | POA: Diagnosis not present

## 2022-11-26 DIAGNOSIS — H25011 Cortical age-related cataract, right eye: Secondary | ICD-10-CM | POA: Diagnosis not present

## 2022-12-18 DIAGNOSIS — H25011 Cortical age-related cataract, right eye: Secondary | ICD-10-CM | POA: Diagnosis not present

## 2022-12-18 DIAGNOSIS — H2511 Age-related nuclear cataract, right eye: Secondary | ICD-10-CM | POA: Diagnosis not present

## 2022-12-18 DIAGNOSIS — H52221 Regular astigmatism, right eye: Secondary | ICD-10-CM | POA: Diagnosis not present

## 2022-12-18 DIAGNOSIS — H25811 Combined forms of age-related cataract, right eye: Secondary | ICD-10-CM | POA: Diagnosis not present

## 2022-12-25 DIAGNOSIS — M1612 Unilateral primary osteoarthritis, left hip: Secondary | ICD-10-CM | POA: Diagnosis not present

## 2023-01-05 ENCOUNTER — Other Ambulatory Visit: Payer: Self-pay

## 2023-01-05 MED ORDER — METOPROLOL SUCCINATE ER 25 MG PO TB24: 12.5000 mg | ORAL_TABLET | Freq: Every day | ORAL | 3 refills | Status: DC

## 2023-02-05 DIAGNOSIS — M160 Bilateral primary osteoarthritis of hip: Secondary | ICD-10-CM | POA: Diagnosis not present

## 2023-02-10 ENCOUNTER — Other Ambulatory Visit: Payer: Self-pay | Admitting: Family Medicine

## 2023-03-13 ENCOUNTER — Other Ambulatory Visit: Payer: Self-pay | Admitting: Family Medicine

## 2023-03-13 NOTE — Telephone Encounter (Signed)
Requested Prescriptions   Pending Prescriptions Disp Refills   meloxicam (MOBIC) 7.5 MG tablet [Pharmacy Med Name: meloxicam 7.5 mg tablet] 30 tablet 3    Sig: TAKE 1 TABLET BY MOUTH ONCE DAILY     Date of patient request: 03/13/2023 Last office visit: 11/13/2022 Upcoming visit: 05/15/2023 Date of last refill: 11/19/2022 Last refill amount: 30

## 2023-04-02 DIAGNOSIS — G894 Chronic pain syndrome: Secondary | ICD-10-CM | POA: Diagnosis not present

## 2023-04-02 DIAGNOSIS — M160 Bilateral primary osteoarthritis of hip: Secondary | ICD-10-CM | POA: Diagnosis not present

## 2023-04-07 ENCOUNTER — Other Ambulatory Visit: Payer: Self-pay | Admitting: Family Medicine

## 2023-04-23 DIAGNOSIS — H401123 Primary open-angle glaucoma, left eye, severe stage: Secondary | ICD-10-CM | POA: Diagnosis not present

## 2023-04-23 DIAGNOSIS — H04123 Dry eye syndrome of bilateral lacrimal glands: Secondary | ICD-10-CM | POA: Diagnosis not present

## 2023-05-04 DIAGNOSIS — Z85820 Personal history of malignant melanoma of skin: Secondary | ICD-10-CM | POA: Diagnosis not present

## 2023-05-04 DIAGNOSIS — L821 Other seborrheic keratosis: Secondary | ICD-10-CM | POA: Diagnosis not present

## 2023-05-04 DIAGNOSIS — Z129 Encounter for screening for malignant neoplasm, site unspecified: Secondary | ICD-10-CM | POA: Diagnosis not present

## 2023-05-04 DIAGNOSIS — L820 Inflamed seborrheic keratosis: Secondary | ICD-10-CM | POA: Diagnosis not present

## 2023-05-14 ENCOUNTER — Other Ambulatory Visit: Payer: Self-pay | Admitting: Family Medicine

## 2023-05-15 ENCOUNTER — Encounter: Payer: Self-pay | Admitting: Family Medicine

## 2023-05-15 ENCOUNTER — Ambulatory Visit (INDEPENDENT_AMBULATORY_CARE_PROVIDER_SITE_OTHER): Payer: Medicare Other | Admitting: Family Medicine

## 2023-05-15 VITALS — BP 134/70 | HR 68 | Temp 98.1°F | Ht 61.0 in | Wt 156.4 lb

## 2023-05-15 DIAGNOSIS — F4321 Adjustment disorder with depressed mood: Secondary | ICD-10-CM

## 2023-05-15 DIAGNOSIS — E785 Hyperlipidemia, unspecified: Secondary | ICD-10-CM

## 2023-05-15 DIAGNOSIS — M818 Other osteoporosis without current pathological fracture: Secondary | ICD-10-CM | POA: Diagnosis not present

## 2023-05-15 NOTE — Assessment & Plan Note (Signed)
 Ongoing issue for pt.  Last DEXA actually showed improvement from osteoporosis --> osteopenia on the Evista.  Repeat DEXA ordered

## 2023-05-15 NOTE — Assessment & Plan Note (Signed)
 Chronic problem.  Currently on Zetia and Lipitor daily.  Asymptomatic.  Check labs.  Adjust meds prn

## 2023-05-15 NOTE — Assessment & Plan Note (Signed)
 Ongoing issue.  Daughter has been dx'd w/ pancreatic cancer and given 6 months to live.  She reports she has been through the 'crying phase' and anger.  At this point, she is trying to remain hopeful and optimistic.  Discussed medication but pt is not interested at this time.  She knows she can reach out if that changes.

## 2023-05-15 NOTE — Progress Notes (Signed)
   Subjective:    Patient ID: Denise Ewing, female    DOB: [birth year], 78 y.o.   MRN: [id number]  HPI Hyperlipidemia- chronic problem, on Zetia 10mg  daily, Lipitor 10mg  daily.  No CP, SOB, HA's, abd pain, N/V.  Osteoporosis- due for repeat DEXA.  Currently on Evista 60mg  daily.  Last DEXA showed improvement in bone density.  Adjustment disorder- daughter was given 6-12 months to live due to pancreatic cancer.  Pt is trying to be optimistic.     Review of Systems For ROS see HPI     Objective:   Physical Exam Vitals reviewed.  Constitutional:      General: She is not in acute distress.    Appearance: Normal appearance. She is well-developed. She is not ill-appearing.  HENT:     Head: Normocephalic and atraumatic.  Eyes:     Conjunctiva/sclera: Conjunctivae normal.     Pupils: Pupils are equal, round, and reactive to light.  Neck:     Thyroid: No thyromegaly.  Cardiovascular:     Rate and Rhythm: Normal rate and regular rhythm.     Pulses: Normal pulses.     Heart sounds: Normal heart sounds. No murmur heard. Pulmonary:     Effort: Pulmonary effort is normal. No respiratory distress.     Breath sounds: Normal breath sounds.  Abdominal:     General: There is no distension.     Palpations: Abdomen is soft.     Tenderness: There is no abdominal tenderness.  Musculoskeletal:     Cervical back: Normal range of motion and neck supple.     Right lower leg: No edema.     Left lower leg: No edema.  Lymphadenopathy:     Cervical: No cervical adenopathy.  Skin:    General: Skin is warm and dry.  Neurological:     General: No focal deficit present.     Mental Status: She is alert and oriented to person, place, and time.  Psychiatric:        Mood and Affect: Mood normal.        Behavior: Behavior normal.           Assessment & Plan:

## 2023-05-15 NOTE — Patient Instructions (Addendum)
 Schedule your complete physical in 6 months You can either go to [address] to get your labs done or schedule a lab visit here at your convenience (our lab tech is out today) We'll notify you of your lab results and make any changes if needed Continue to work on healthy diet and regular exercise- you look great! Call with any questions or concerns Stay Safe!  Stay Healthy! Happy Spring!!!

## 2023-05-20 ENCOUNTER — Encounter: Payer: Self-pay | Admitting: Family Medicine

## 2023-05-22 ENCOUNTER — Other Ambulatory Visit

## 2023-05-22 DIAGNOSIS — E785 Hyperlipidemia, unspecified: Secondary | ICD-10-CM | POA: Diagnosis not present

## 2023-05-22 LAB — CBC WITH DIFFERENTIAL/PLATELET
Basophils Absolute: 0 10*3/uL (ref 0.0–0.1)
Basophils Relative: 0.6 % (ref 0.0–3.0)
Eosinophils Absolute: 0.3 10*3/uL (ref 0.0–0.7)
Eosinophils Relative: 4.2 % (ref 0.0–5.0)
HCT: 40.6 % (ref 36.0–46.0)
Hemoglobin: 13.4 g/dL (ref 12.0–15.0)
Lymphocytes Relative: 34.6 % (ref 12.0–46.0)
Lymphs Abs: 2.6 10*3/uL (ref 0.7–4.0)
MCHC: 32.9 g/dL (ref 30.0–36.0)
MCV: 93.8 fl (ref 78.0–100.0)
Monocytes Absolute: 0.7 10*3/uL (ref 0.1–1.0)
Monocytes Relative: 8.8 % (ref 3.0–12.0)
Neutro Abs: 3.9 10*3/uL (ref 1.4–7.7)
Neutrophils Relative %: 51.8 % (ref 43.0–77.0)
Platelets: 256 10*3/uL (ref 150.0–400.0)
RBC: 4.33 Mil/uL (ref 3.87–5.11)
RDW: 13.9 % (ref 11.5–15.5)
WBC: 7.6 10*3/uL (ref 4.0–10.5)

## 2023-05-22 LAB — BASIC METABOLIC PANEL WITH GFR
BUN: 22 mg/dL (ref 6–23)
CO2: 28 meq/L (ref 19–32)
Calcium: 9.6 mg/dL (ref 8.4–10.5)
Chloride: 104 meq/L (ref 96–112)
Creatinine, Ser: 0.76 mg/dL (ref 0.40–1.20)
GFR: 75.04 mL/min (ref >60.00)
Glucose, Bld: 80 mg/dL (ref 70–99)
Potassium: 3.9 meq/L (ref 3.5–5.1)
Sodium: 140 meq/L (ref 135–145)

## 2023-05-22 LAB — HEPATIC FUNCTION PANEL
ALT: 17 U/L (ref 0–35)
AST: 23 U/L (ref 0–37)
Albumin: 4.5 g/dL (ref 3.5–5.2)
Alkaline Phosphatase: 59 U/L (ref 39–117)
Bilirubin, Direct: 0.1 mg/dL (ref 0.0–0.3)
Total Bilirubin: 0.5 mg/dL (ref 0.2–1.2)
Total Protein: 7 g/dL (ref 6.0–8.3)

## 2023-05-22 LAB — LIPID PANEL
Cholesterol: 133 mg/dL (ref 0–200)
HDL: 57.7 mg/dL (ref >39.00)
LDL Cholesterol: 60 mg/dL (ref 0–99)
NonHDL: 75.37
Total CHOL/HDL Ratio: 2
Triglycerides: 79 mg/dL (ref 0.0–149.0)
VLDL: 15.8 mg/dL (ref 0.0–40.0)

## 2023-05-22 LAB — TSH: TSH: 2.8 u[IU]/mL (ref 0.35–5.50)

## 2023-05-25 ENCOUNTER — Encounter: Payer: Self-pay | Admitting: Family Medicine

## 2023-05-25 DIAGNOSIS — Z1231 Encounter for screening mammogram for malignant neoplasm of breast: Secondary | ICD-10-CM

## 2023-05-26 NOTE — Telephone Encounter (Signed)
 Dr.Tabori I seen in patients chart a order for a mammogram that will expire June of 2025, After talking to [facility] patient is not due for another mammogram until August of this year. Can we put in a new mammogram order for patient.

## 2023-06-18 DIAGNOSIS — M160 Bilateral primary osteoarthritis of hip: Secondary | ICD-10-CM | POA: Diagnosis not present

## 2023-07-10 DIAGNOSIS — Z780 Asymptomatic menopausal state: Secondary | ICD-10-CM | POA: Diagnosis not present

## 2023-07-10 DIAGNOSIS — M818 Other osteoporosis without current pathological fracture: Secondary | ICD-10-CM | POA: Diagnosis not present

## 2023-07-10 DIAGNOSIS — M810 Age-related osteoporosis without current pathological fracture: Secondary | ICD-10-CM | POA: Diagnosis not present

## 2023-07-10 LAB — HM DEXA SCAN

## 2023-07-13 ENCOUNTER — Ambulatory Visit: Payer: Self-pay | Admitting: Family Medicine

## 2023-07-13 NOTE — Telephone Encounter (Signed)
-----   Message from Laymon Priest sent at 07/13/2023  7:26 AM EDT ----- Your bone density shows continued osteoporosis, but you are already on the appropriate medication (Raloxifene ).  No changes at this time

## 2023-07-13 NOTE — Telephone Encounter (Signed)
 Pt has read results via MyChart

## 2023-07-29 DIAGNOSIS — H401123 Primary open-angle glaucoma, left eye, severe stage: Secondary | ICD-10-CM | POA: Diagnosis not present

## 2023-07-29 DIAGNOSIS — H04123 Dry eye syndrome of bilateral lacrimal glands: Secondary | ICD-10-CM | POA: Diagnosis not present

## 2023-07-29 DIAGNOSIS — H43813 Vitreous degeneration, bilateral: Secondary | ICD-10-CM | POA: Diagnosis not present

## 2023-07-31 DIAGNOSIS — Z6830 Body mass index (BMI) 30.0-30.9, adult: Secondary | ICD-10-CM | POA: Diagnosis not present

## 2023-07-31 DIAGNOSIS — Z124 Encounter for screening for malignant neoplasm of cervix: Secondary | ICD-10-CM | POA: Diagnosis not present

## 2023-07-31 DIAGNOSIS — Z01419 Encounter for gynecological examination (general) (routine) without abnormal findings: Secondary | ICD-10-CM | POA: Diagnosis not present

## 2023-08-03 ENCOUNTER — Other Ambulatory Visit: Payer: Self-pay | Admitting: Family Medicine

## 2023-08-10 ENCOUNTER — Other Ambulatory Visit: Payer: Self-pay | Admitting: Gastroenterology

## 2023-08-10 ENCOUNTER — Other Ambulatory Visit: Payer: Self-pay | Admitting: Family Medicine

## 2023-08-25 ENCOUNTER — Ambulatory Visit (INDEPENDENT_AMBULATORY_CARE_PROVIDER_SITE_OTHER): Admitting: *Deleted

## 2023-08-25 VITALS — Ht 61.0 in | Wt 156.0 lb

## 2023-08-25 DIAGNOSIS — Z0000 Encounter for general adult medical examination without abnormal findings: Secondary | ICD-10-CM

## 2023-08-25 NOTE — Progress Notes (Signed)
 Subjective:   Denise Ewing is a 78 y.o. female who presents for Medicare Annual (Subsequent) preventive examination.  Visit Complete: Virtual I connected with  Denise Ewing on 08/25/23 by a audio enabled telemedicine application and verified that I am speaking with the correct person using two identifiers.  Patient Location: Home  Provider Location: Home Office  I discussed the limitations of evaluation and management by telemedicine. The patient expressed understanding and agreed to proceed.  Vital Signs: Because this visit was a virtual/telehealth visit, some criteria may be missing or patient reported. Any vitals not documented were not able to be obtained and vitals that have been documented are patient reported.  Patient Medicare AWV questionnaire was completed by the patient on 08-24-2023; I have confirmed that all information answered by patient is correct and no changes since this date.  Cardiac Risk Factors include: advanced age (>4men, >62 women);obesity (BMI >30kg/m2);hypertension     Objective:    Today's Vitals   08/25/23 1358  Weight: 156 lb (70.8 kg)  Height: 5' 1 (1.549 m)   Body mass index is 29.48 kg/m.     08/25/2023    1:55 PM 07/03/2022    3:04 PM 03/07/2021    2:21 PM 02/27/2020    2:18 PM 10/26/2016    8:02 PM 01/05/2014    8:57 PM  Advanced Directives  Does Patient Have a Medical Advance Directive? Yes No Yes Yes No  Yes   Type of Estate agent of [place];Living will  Healthcare Power of [place];Living will Living will  Healthcare Power of Attorney   Does patient want to make changes to medical advance directive?      No - Patient declined   Copy of Healthcare Power of Attorney in Chart? No - copy requested  No - copy requested   No - copy requested   Would patient like information on creating a medical advance directive?  No - Patient declined   No - Patient declined       Data saved with a previous flowsheet row definition     Current Medications (verified) Outpatient Encounter Medications as of 08/25/2023  Medication Sig   aspirin  81 MG tablet Take 81 mg by mouth daily.   atorvastatin  (LIPITOR) 10 MG tablet Take 1 tablet (10 mg total) by mouth daily.   Calcium  Citrate-Vitamin D  250-200 MG-UNIT TABS Take 2 tablets by mouth at bedtime.   Coenzyme Q10 (CO Q-10 PO) Take 1 capsule by mouth every morning.   conjugated estrogens (PREMARIN) vaginal cream Place 1.5 g vaginally once a week. Take one tablet by mouth on Sundays per patient   dorzolamide-timolol  (COSOPT) 22.3-6.8 MG/ML ophthalmic solution 1 drop 2 (two) times daily.   ezetimibe  (ZETIA ) 10 MG tablet Take 0.5 tablets (5 mg total) by mouth daily.   famotidine  (PEPCID ) 20 MG tablet Take 20 mg by mouth daily.   meloxicam  (MOBIC ) 7.5 MG tablet TAKE 1 TABLET BY MOUTH ONCE DAILY   metoprolol  succinate (TOPROL  XL) 25 MG 24 hr tablet Take 0.5 tablets (12.5 mg total) by mouth daily.   Multiple Vitamins-Minerals (ALIVE WOMENS 50+ PO) Take 1 tablet by mouth every morning.   pantoprazole  (PROTONIX ) 40 MG tablet TAKE 1 TABLET BY MOUTH 2 TIMES DAILY   raloxifene  (EVISTA ) 60 MG tablet TAKE 1 TABLET BY MOUTH EVERY DAY   TRAVATAN Z 0.004 % SOLN ophthalmic solution Place 1 drop into both eyes at bedtime.    No facility-administered encounter medications on file as of  08/25/2023.    Allergies (verified) Amoxicillin and Albuterol    History: Past Medical History:  Diagnosis Date   Allergy    post nasal drip   Arthritis    Blood transfusion without reported diagnosis    with hysterectomy 11 years ago   GERD (gastroesophageal reflux disease)    Glaucoma    bilateral eyes   History of colonic polyps    Melanoma of thigh (HCC)    Osteoporosis    Past Surgical History:  Procedure Laterality Date   Colon polyps removed     Melanoma removed     R thigh   TOTAL ABDOMINAL HYSTERECTOMY W/ BILATERAL SALPINGOOPHORECTOMY     uterine fibroids   Family History  Problem  Relation Age of Onset   Hypertension Mother    Cancer Mother        breast,anal   Osteoporosis Mother    Breast cancer Mother 64   Rectal cancer Mother    CAD Sister    Arrhythmia Sister    Hypothyroidism Sister    Arrhythmia Brother    Colon polyps Neg Hx    Esophageal cancer Neg Hx    Stomach cancer Neg Hx    Social History   Socioeconomic History   Marital status: Married    Spouse name: Not on file   Number of children: 2   Years of education: Not on file   Highest education level: Some college, no degree  Occupational History   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  Tobacco Use   Smoking status: Never   Smokeless tobacco: Never  Vaping Use   Vaping status: Never Used  Substance and Sexual Activity   Alcohol use: Yes    Comment:  occasionally   Drug use: No   Sexual activity: Not Currently  Other Topics Concern   Not on file  Social History Narrative   Not on file   Social Drivers of Health   Financial Resource Strain: Low Risk  (08/25/2023)   Overall Financial Resource Strain (CARDIA)    Difficulty of Paying Living Expenses: Not hard at all  Food Insecurity: No Food Insecurity (08/25/2023)   Hunger Vital Sign    Worried About Running Out of Food in the Last Year: Never true    Ran Out of Food in the Last Year: Never true  Transportation Needs: No Transportation Needs (08/25/2023)   PRAPARE - Administrator, Civil Service (Medical): No    Lack of Transportation (Non-Medical): No  Physical Activity: Insufficiently Active (08/25/2023)   Exercise Vital Sign    Days of Exercise per Week: 7 days    Minutes of Exercise per Session: 10 min  Stress: No Stress Concern Present (08/25/2023)   [employer] of Occupational Health - Occupational Stress Questionnaire    Feeling of Stress: Only a little  Social Connections: Moderately Integrated (08/25/2023)   Social Connection and Isolation Panel    Frequency of Communication with Friends and Family: More  than three times a week    Frequency of Social Gatherings with Friends and Family: Once a week    Attends Religious Services: 1 to 4 times per year    Active Member of [employer] or Organizations: No    Attends Engineer, structural: Not on file    Marital Status: Married    Tobacco Counseling Counseling given: Not Answered   Clinical Intake:  Pre-visit preparation completed: Yes  Pain : No/denies pain     Diabetes: No  How often do you need to have someone help you when you read instructions, pamphlets, or other written materials from your doctor or pharmacy?: 1 - Never  Interpreter Needed?: No  Information entered by :: Mliss Graff LPN   Activities of Daily Living    08/25/2023    1:58 PM 08/24/2023    9:22 AM  In your present state of health, do you have any difficulty performing the following activities:  Hearing? 0 0  Vision? 0 0  Difficulty concentrating or making decisions? 0 0  Walking or climbing stairs? 0 0  Dressing or bathing? 0 0  Doing errands, shopping? 0 0  Preparing Food and eating ? N N  Using the Toilet? N N  In the past six months, have you accidently leaked urine? N N  Do you have problems with loss of bowel control? N N  Managing your Medications? N N  Managing your Finances? N N  Housekeeping or managing your Housekeeping? N N    Patient Care Team: Mahlon Comer BRAVO, MD as PCP - General Okey Vina GAILS, MD as PCP - Cardiology (Cardiology) Dannielle Bouchard, DO as Consulting Physician (Obstetrics and Gynecology) Patrcia Sharper, MD as Consulting Physician (Ophthalmology) Teressa Toribio SQUIBB, MD (Inactive) as Attending Physician (Gastroenterology) Nicholaus Sherlean CROME, [facility] (Inactive) (Pharmacist)  Indicate any recent Medical Services you may have received from other than [facility] providers in the past year (date may be approximate).     Assessment:   This is a routine wellness examination for [place].  Hearing/Vision screen Hearing Screening -  Comments:: No trouble hearing Vision Screening - Comments:: [facility]  [place] Up to date   Goals Addressed             This Visit's Progress    Manage Pain-Osteoarthritis   On track    Timeframe:  Long-Range Goal Priority:  High Start Date:  01/09/21                           Expected End Date:  07/09/21                     Follow Up Date 04/11/21    - call for medicine refill 2 or 3 days before it runs out - develop a personal pain management plan - prioritize tasks for the day - stay active    Why is this important?   Day-to-day life can be hard when you have joint pain.  Pain medicine is just one piece of the treatment puzzle. There are many things you can do to manage pain and stay strong.   Lifestyle changes like stopping smoking and eating foods with Vitamin D  and calcium  keeps your bones and muscles healthy. Your joints are better when supported by strong muscles.  You can try these action steps to help you manage your pain.     Notes:      Patient Stated   Not on track    Increase activity     Weight (lb) < 200 lb (90.7 kg)   156 lb (70.8 kg)      Depression Screen    08/25/2023    1:58 PM 05/15/2023   12:33 PM 07/03/2022    3:09 PM 05/14/2022    1:35 PM 03/11/2021    1:54 PM 03/07/2021    2:22 PM 03/07/2021    2:19 PM  PHQ 2/9 Scores  PHQ - 2 Score 1 0  2 0 1 0 0  PHQ- 9 Score 2 0 8 0 4      Fall Risk    08/25/2023    2:19 PM 08/24/2023    9:22 AM 05/15/2023   12:33 PM 07/03/2022    3:02 PM 07/02/2022    4:32 PM  Fall Risk   Falls in the past year? 0 0 0 0 0  Number falls in past yr: 0  0 0   Injury with Fall? 0  0 0   Risk for fall due to :   No Fall Risks    Follow up Falls evaluation completed;Education provided;Falls prevention discussed  Falls evaluation completed Falls evaluation completed;Education provided;Falls prevention discussed     MEDICARE RISK AT HOME: Medicare Risk at Home Any stairs in or around the home?: Yes If so, are  there any without handrails?: Yes Home free of loose throw rugs in walkways, pet beds, electrical cords, etc?: Yes Adequate lighting in your home to reduce risk of falls?: Yes Life alert?: No Use of a cane, walker or w/c?: No Grab bars in the bathroom?: No Shower chair or bench in shower?: Yes Elevated toilet seat or a handicapped toilet?: No  TIMED UP AND GO:  Was the test performed?  No    Cognitive Function:        08/25/2023    1:57 PM 07/03/2022    3:04 PM  6CIT Screen  What Year? 0 points 0 points  What month? 0 points 0 points  What time? 0 points 0 points  Count back from 20 0 points 0 points  Months in reverse 0 points 0 points  Repeat phrase 0 points 0 points  Total Score 0 points 0 points    Immunizations Immunization History  Administered Date(s) Administered   Fluad Quad(high Dose 65+) 11/04/2019, 11/16/2020   Influenza Split 12/16/2011   Influenza Whole 12/05/2009, 11/04/2010   Influenza, High Dose Seasonal PF 11/30/2012, 10/30/2018   Influenza,inj,Quad PF,6+ Mos 11/15/2013, 11/28/2014, 10/15/2015, 12/16/2016, 12/03/2017   Influenza-Unspecified 10/25/2012, 10/09/2018, 11/06/2022   PFIZER(Purple Top)SARS-COV-2 Vaccination 04/01/2019, 04/26/2019, 11/22/2019   Pneumococcal Conjugate-13 01/11/2014   Pneumococcal Polysaccharide-23 09/05/2011   Pneumococcal-Unspecified 12/08/2017   Tdap 02/09/2007   Zoster Recombinant(Shingrix) 08/22/2017, 10/30/2017   Zoster, Live 12/08/2017    TDAP status: Due, Education has been provided regarding the importance of this vaccine. Advised may receive this vaccine at local pharmacy or Health Dept. Aware to provide a copy of the vaccination record if obtained from local pharmacy or Health Dept. Verbalized acceptance and understanding.  Flu Vaccine status: Up to date  Pneumococcal vaccine status: Up to date  Covid-19 vaccine status: Declined, Education has been provided regarding the importance of this vaccine but patient still  declined. Advised may receive this vaccine at local pharmacy or Health Dept.or vaccine clinic. Aware to provide a copy of the vaccination record if obtained from local pharmacy or Health Dept. Verbalized acceptance and understanding.  Qualifies for Shingles Vaccine? No   Zostavax completed Yes   Shingrix Completed?: Yes  Screening Tests Health Maintenance  Topic Date Due   INFLUENZA VACCINE  09/25/2023   MAMMOGRAM  09/30/2023   Medicare Annual Wellness (AWV)  08/24/2024   DEXA SCAN  07/09/2025   Colonoscopy  02/06/2027   Pneumococcal Vaccine: 50+ Years  Completed   Zoster Vaccines- Shingrix  Completed   Hepatitis B Vaccines  Aged Out   HPV VACCINES  Aged Out   Meningococcal B Vaccine  Aged Out  DTaP/Tdap/Td  Discontinued   COVID-19 Vaccine  Discontinued   Hepatitis C Screening  Discontinued    Health Maintenance  There are no preventive care reminders to display for this patient.   Colorectal cancer screening: Type of screening: Colonoscopy. Completed 2023. Repeat every 5 years  Mammogram status: Completed  . Repeat every year  Bone Density status: Completed 2025. Results reflect: Bone density results: OSTEOPOROSIS. Repeat every 2 years.  Lung Cancer Screening: (Low Dose CT Chest recommended if Age 77-80 years, 20 pack-year currently smoking OR have quit w/in 15years.) does not qualify.   Lung Cancer Screening Referral:   Additional Screening:  Hepatitis C Screening  never done  Vision Screening: Recommended annual ophthalmology exams for early detection of glaucoma and other disorders of the eye. Is the patient up to date with their annual eye exam?  Yes  Who is the provider or what is the name of the office in which the patient attends annual eye exams? [facility]  If pt is not established with a provider, would they like to be referred to a provider to establish care? Yes .   Dental Screening: Recommended annual dental exams for proper oral  hygiene    Community Resource Referral / Chronic Care Management: CRR required this visit?  No   CCM required this visit?  No     Plan:     I have personally reviewed and noted the following in the patient's chart:   Medical and social history Use of alcohol, tobacco or illicit drugs  Current medications and supplements including opioid prescriptions. Patient is not currently taking opioid prescriptions. Functional ability and status Nutritional status Physical activity Advanced directives List of other physicians Hospitalizations, surgeries, and ER visits in previous 12 months Vitals Screenings to include cognitive, depression, and falls Referrals and appointments  In addition, I have reviewed and discussed with patient certain preventive protocols, quality metrics, and best practice recommendations. A written personalized care plan for preventive services as well as general preventive health recommendations were provided to patient.     Mliss Graff, LPN   04/03/7972   After Visit Summary: (MyChart) Due to this being a telephonic visit, the after visit summary with patients personalized plan was offered to patient via MyChart   Nurse Notes:

## 2023-08-25 NOTE — Patient Instructions (Signed)
 Denise Ewing , Thank you for taking time to come for your Medicare Wellness Visit. I appreciate your ongoing commitment to your health goals. Please review the following plan we discussed and let me know if I can assist you in the future.   Screening recommendations/referrals: Colonoscopy: up to date Mammogram: up to date Bone Density: up to date Recommended yearly ophthalmology/optometry visit for glaucoma screening and checkup Recommended yearly dental visit for hygiene and checkup  Vaccinations: Influenza vaccine: up to date Pneumococcal vaccine: up to date Tdap vaccine: Education provided Shingles vaccine: up to date       Preventive Care 65 Years and Older, Female Preventive care refers to lifestyle choices and visits with your health care provider that can promote health and wellness. What does preventive care include? A yearly physical exam. This is also called an annual well check. Dental exams once or twice a year. Routine eye exams. Ask your health care provider how often you should have your eyes checked. Personal lifestyle choices, including: Daily care of your teeth and gums. Regular physical activity. Eating a healthy diet. Avoiding tobacco and drug use. Limiting alcohol use. Practicing safe sex. Taking low-dose aspirin  every day. Taking vitamin and mineral supplements as recommended by your health care provider. What happens during an annual well check? The services and screenings done by your health care provider during your annual well check will depend on your age, overall health, lifestyle risk factors, and family history of disease. Counseling  Your health care provider may ask you questions about your: Alcohol use. Tobacco use. Drug use. Emotional well-being. Home and relationship well-being. Sexual activity. Eating habits. History of falls. Memory and ability to understand (cognition). Work and work Astronomer. Reproductive health. Screening  You  may have the following tests or measurements: Height, weight, and BMI. Blood pressure. Lipid and cholesterol levels. These may be checked every 5 years, or more frequently if you are over 48 years old. Skin check. Lung cancer screening. You may have this screening every year starting at age 52 if you have a 30-pack-year history of smoking and currently smoke or have quit within the past 15 years. Fecal occult blood test (FOBT) of the stool. You may have this test every year starting at age 36. Flexible sigmoidoscopy or colonoscopy. You may have a sigmoidoscopy every 5 years or a colonoscopy every 10 years starting at age 34. Hepatitis C blood test. Hepatitis B blood test. Sexually transmitted disease (STD) testing. Diabetes screening. This is done by checking your blood sugar (glucose) after you have not eaten for a while (fasting). You may have this done every 1-3 years. Bone density scan. This is done to screen for osteoporosis. You may have this done starting at age 33. Mammogram. This may be done every 1-2 years. Talk to your health care provider about how often you should have regular mammograms. Talk with your health care provider about your test results, treatment options, and if necessary, the need for more tests. Vaccines  Your health care provider may recommend certain vaccines, such as: Influenza vaccine. This is recommended every year. Tetanus, diphtheria, and acellular pertussis (Tdap, Td) vaccine. You may need a Td booster every 10 years. Zoster vaccine. You may need this after age 65. Pneumococcal 13-valent conjugate (PCV13) vaccine. One dose is recommended after age 47. Pneumococcal polysaccharide (PPSV23) vaccine. One dose is recommended after age 39. Talk to your health care provider about which screenings and vaccines you need and how often you need them. This  information is not intended to replace advice given to you by your health care provider. Make sure you discuss any  questions you have with your health care provider. Document Released: 03/09/2015 Document Revised: 10/31/2015 Document Reviewed: 12/12/2014 Elsevier Interactive Patient Education  2017 [employer].  Fall Prevention in the Home Falls can cause injuries. They can happen to people of all ages. There are many things you can do to make your home safe and to help prevent falls. What can I do on the outside of my home? Regularly fix the edges of walkways and driveways and fix any cracks. Remove anything that might make you trip as you walk through a door, such as a raised step or threshold. Trim any bushes or trees on the path to your home. Use bright outdoor lighting. Clear any walking paths of anything that might make someone trip, such as rocks or tools. Regularly check to see if handrails are loose or broken. Make sure that both sides of any steps have handrails. Any raised decks and porches should have guardrails on the edges. Have any leaves, snow, or ice cleared regularly. Use sand or salt on walking paths during winter. Clean up any spills in your garage right away. This includes oil or grease spills. What can I do in the bathroom? Use night lights. Install grab bars by the toilet and in the tub and shower. Do not use towel bars as grab bars. Use non-skid mats or decals in the tub or shower. If you need to sit down in the shower, use a plastic, non-slip stool. Keep the floor dry. Clean up any water that spills on the floor as soon as it happens. Remove soap buildup in the tub or shower regularly. Attach bath mats securely with double-sided non-slip rug tape. Do not have throw rugs and other things on the floor that can make you trip. What can I do in the bedroom? Use night lights. Make sure that you have a light by your bed that is easy to reach. Do not use any sheets or blankets that are too big for your bed. They should not hang down onto the floor. Have a firm chair that has side  arms. You can use this for support while you get dressed. Do not have throw rugs and other things on the floor that can make you trip. What can I do in the kitchen? Clean up any spills right away. Avoid walking on wet floors. Keep items that you use a lot in easy-to-reach places. If you need to reach something above you, use a strong step stool that has a grab bar. Keep electrical cords out of the way. Do not use floor polish or wax that makes floors slippery. If you must use wax, use non-skid floor wax. Do not have throw rugs and other things on the floor that can make you trip. What can I do with my stairs? Do not leave any items on the stairs. Make sure that there are handrails on both sides of the stairs and use them. Fix handrails that are broken or loose. Make sure that handrails are as long as the stairways. Check any carpeting to make sure that it is firmly attached to the stairs. Fix any carpet that is loose or worn. Avoid having throw rugs at the top or bottom of the stairs. If you do have throw rugs, attach them to the floor with carpet tape. Make sure that you have a light switch at the top  of the stairs and the bottom of the stairs. If you do not have them, ask someone to add them for you. What else can I do to help prevent falls? Wear shoes that: Do not have high heels. Have rubber bottoms. Are comfortable and fit you well. Are closed at the toe. Do not wear sandals. If you use a stepladder: Make sure that it is fully opened. Do not climb a closed stepladder. Make sure that both sides of the stepladder are locked into place. Ask someone to hold it for you, if possible. Clearly mark and make sure that you can see: Any grab bars or handrails. First and last steps. Where the edge of each step is. Use tools that help you move around (mobility aids) if they are needed. These include: Canes. Walkers. Scooters. Crutches. Turn on the lights when you go into a dark area.  Replace any light bulbs as soon as they burn out. Set up your furniture so you have a clear path. Avoid moving your furniture around. If any of your floors are uneven, fix them. If there are any pets around you, be aware of where they are. Review your medicines with your doctor. Some medicines can make you feel dizzy. This can increase your chance of falling. Ask your doctor what other things that you can do to help prevent falls. This information is not intended to replace advice given to you by your health care provider. Make sure you discuss any questions you have with your health care provider. Document Released: 12/07/2008 Document Revised: 07/19/2015 Document Reviewed: 03/17/2014 Elsevier Interactive Patient Education  2017 [employer].

## 2023-09-01 ENCOUNTER — Encounter: Payer: Self-pay | Admitting: Family Medicine

## 2023-09-01 NOTE — Telephone Encounter (Signed)
 Okay to sign new mammo? Looks like there was one ordered in April by [facility] but not the correct screening mammo

## 2023-09-01 NOTE — Telephone Encounter (Signed)
 Order printed and faxed  Patient called to inform of orders sent over

## 2023-10-29 ENCOUNTER — Other Ambulatory Visit: Payer: Self-pay | Admitting: Gastroenterology

## 2023-10-29 ENCOUNTER — Other Ambulatory Visit: Payer: Self-pay | Admitting: Family Medicine

## 2023-10-30 NOTE — Telephone Encounter (Signed)
 Requested Prescriptions   Pending Prescriptions Disp Refills   meloxicam  (MOBIC ) 7.5 MG tablet [Pharmacy Med Name: meloxicam  7.5 mg tablet] 30 tablet 3    Sig: TAKE 1 TABLET BY MOUTH ONCE DAILY     Date of patient request: 10/30/2023 Last office visit: 05/15/2023 Upcoming visit: 11/20/2023 Date of last refill: 08/03/2023 Last refill amount: 30

## 2023-11-02 ENCOUNTER — Other Ambulatory Visit: Payer: Self-pay | Admitting: Internal Medicine

## 2023-11-02 ENCOUNTER — Other Ambulatory Visit: Payer: Self-pay | Admitting: Family Medicine

## 2023-11-05 ENCOUNTER — Other Ambulatory Visit: Payer: Self-pay

## 2023-11-05 ENCOUNTER — Telehealth: Payer: Self-pay

## 2023-11-05 DIAGNOSIS — Z1231 Encounter for screening mammogram for malignant neoplasm of breast: Secondary | ICD-10-CM

## 2023-11-05 NOTE — Telephone Encounter (Signed)
 Received a fax for a request for a diagnostic mammo order instead of screening. I reordered this and faxed it to [facility]

## 2023-11-20 ENCOUNTER — Ambulatory Visit: Admitting: Family Medicine

## 2023-11-20 ENCOUNTER — Encounter: Payer: Self-pay | Admitting: Family Medicine

## 2023-11-20 VITALS — BP 124/78 | HR 69 | Temp 98.0°F | Ht 61.0 in | Wt 158.0 lb

## 2023-11-20 DIAGNOSIS — E785 Hyperlipidemia, unspecified: Secondary | ICD-10-CM

## 2023-11-20 DIAGNOSIS — M818 Other osteoporosis without current pathological fracture: Secondary | ICD-10-CM

## 2023-11-20 DIAGNOSIS — Z0000 Encounter for general adult medical examination without abnormal findings: Secondary | ICD-10-CM

## 2023-11-20 DIAGNOSIS — Z1159 Encounter for screening for other viral diseases: Secondary | ICD-10-CM

## 2023-11-20 DIAGNOSIS — R600 Localized edema: Secondary | ICD-10-CM

## 2023-11-20 LAB — BASIC METABOLIC PANEL WITH GFR
BUN: 21 mg/dL (ref 6–23)
CO2: 30 meq/L (ref 19–32)
Calcium: 9.8 mg/dL (ref 8.4–10.5)
Chloride: 105 meq/L (ref 96–112)
Creatinine, Ser: 0.84 mg/dL (ref 0.40–1.20)
GFR: 66.32 mL/min (ref >60.00)
Glucose, Bld: 96 mg/dL (ref 70–99)
Potassium: 4.3 meq/L (ref 3.5–5.1)
Sodium: 139 meq/L (ref 135–145)

## 2023-11-20 LAB — CBC WITH DIFFERENTIAL/PLATELET
Basophils Absolute: 0.1 K/uL (ref 0.0–0.1)
Basophils Relative: 0.7 % (ref 0.0–3.0)
Eosinophils Absolute: 0.4 K/uL (ref 0.0–0.7)
Eosinophils Relative: 5.2 % — ABNORMAL HIGH (ref 0.0–5.0)
HCT: 39.8 % (ref 36.0–46.0)
Hemoglobin: 13.1 g/dL (ref 12.0–15.0)
Lymphocytes Relative: 36.8 % (ref 12.0–46.0)
Lymphs Abs: 2.6 K/uL (ref 0.7–4.0)
MCHC: 32.9 g/dL (ref 30.0–36.0)
MCV: 93 fl (ref 78.0–100.0)
Monocytes Absolute: 0.6 K/uL (ref 0.1–1.0)
Monocytes Relative: 7.9 % (ref 3.0–12.0)
Neutro Abs: 3.5 K/uL (ref 1.4–7.7)
Neutrophils Relative %: 49.4 % (ref 43.0–77.0)
Platelets: 231 K/uL (ref 150.0–400.0)
RBC: 4.28 Mil/uL (ref 3.87–5.11)
RDW: 13.2 % (ref 11.5–15.5)
WBC: 7 K/uL (ref 4.0–10.5)

## 2023-11-20 LAB — LIPID PANEL
Cholesterol: 128 mg/dL (ref 0–200)
HDL: 51.7 mg/dL (ref >39.00)
LDL Cholesterol: 51 mg/dL (ref 0–99)
NonHDL: 76.5
Total CHOL/HDL Ratio: 2
Triglycerides: 129 mg/dL (ref 0.0–149.0)
VLDL: 25.8 mg/dL (ref 0.0–40.0)

## 2023-11-20 LAB — VITAMIN D 25 HYDROXY (VIT D DEFICIENCY, FRACTURES): VITD: 69.55 ng/mL (ref 30.00–100.00)

## 2023-11-20 LAB — HEPATIC FUNCTION PANEL
ALT: 18 U/L (ref 0–35)
AST: 25 U/L (ref 0–37)
Albumin: 4.5 g/dL (ref 3.5–5.2)
Alkaline Phosphatase: 58 U/L (ref 39–117)
Bilirubin, Direct: 0.1 mg/dL (ref 0.0–0.3)
Total Bilirubin: 0.4 mg/dL (ref 0.2–1.2)
Total Protein: 7 g/dL (ref 6.0–8.3)

## 2023-11-20 LAB — TSH: TSH: 3.31 u[IU]/mL (ref 0.35–5.50)

## 2023-11-20 MED ORDER — FUROSEMIDE 20 MG PO TABS: 20.0000 mg | ORAL_TABLET | Freq: Every day | ORAL | 3 refills | Status: AC | PRN

## 2023-11-20 NOTE — Progress Notes (Signed)
   Subjective:    Patient ID: Denise Ewing, female    DOB: 11-26-1945, 78 y.o.   MRN: [id number]  HPI CPE- UTD on colonoscopy, DEXA, PNA, flu.  Mammo is scheduled.  Patient Care Team    Relationship Specialty Notifications Start End  Mahlon Comer BRAVO, MD PCP - General   02/06/10   Okey Vina GAILS, MD PCP - Cardiology Cardiology Admissions 03/21/19   Dannielle Bouchard, DO Consulting Physician Obstetrics and Gynecology  04/18/14   Patrcia Sharper, MD Consulting Physician Ophthalmology  08/02/15   Teressa Toribio SQUIBB, MD (Inactive) Attending Physician Gastroenterology  02/09/19   Nicholaus Sherlean CROME, [facility] (Inactive)  Pharmacist  01/09/21    Comment: [phone]    Health Maintenance  Topic Date Due   Hepatitis C Screening  Never done   DTaP/Tdap/Td (2 - Td or Tdap) 02/08/2017   Mammogram  09/30/2023   Medicare Annual Wellness (AWV)  08/24/2024   DEXA SCAN  07/09/2025   Colonoscopy  02/06/2027   Pneumococcal Vaccine: 50+ Years  Completed   Influenza Vaccine  Completed   Zoster Vaccines- Shingrix  Completed   HPV VACCINES  Aged Out   Meningococcal B Vaccine  Aged Out   COVID-19 Vaccine  Discontinued      Review of Systems Patient reports no vision/ hearing changes, adenopathy,fever, weight change,  persistant/recurrent hoarseness , swallowing issues, chest pain, palpitations, persistant/recurrent cough, hemoptysis, dyspnea (rest/exertional/paroxysmal nocturnal), gastrointestinal bleeding (melena, rectal bleeding), abdominal pain, significant heartburn, bowel changes, GU symptoms (dysuria, hematuria, incontinence), Gyn symptoms (abnormal  bleeding, pain),  syncope, focal weakness, memory loss, numbness & tingling, skin/hair/nail changes, abnormal bruising or bleeding, anxiety, or depression.  + LE edema- pt reports legs are normal sized in the morning but swell as the day goes on.  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  If she elevates legs, swelling is less.    Objective:   Physical Exam General  Appearance:    Alert, cooperative, no distress, appears stated age  Head:    Normocephalic, without obvious abnormality, atraumatic  Eyes:    PERRL, conjunctiva/corneas clear, EOM's intact both eyes  Ears:    Normal TM's and external ear canals, both ears  Nose:   Nares normal, septum midline, mucosa normal, no drainage    or sinus tenderness  Throat:   Lips, mucosa, and tongue normal; teeth and gums normal  Neck:   Supple, symmetrical, trachea midline, no adenopathy;    Thyroid : no enlargement/tenderness/nodules  Back:     Symmetric, no curvature, ROM normal, no CVA tenderness  Lungs:     Clear to auscultation bilaterally, respirations unlabored  Chest Wall:    No tenderness or deformity   Heart:    Regular rate and rhythm, S1 and S2 normal, no murmur, rub   or gallop  Breast Exam:    Deferred to mammo  Abdomen:     Soft, non-tender, bowel sounds active all four quadrants,    no masses, no organomegaly  Genitalia:    Deferred to GYN  Rectal:    Extremities:   Extremities normal, atraumatic, no cyanosis, trace ankle edema bilaterally  Pulses:   2+ and symmetric all extremities  Skin:   Skin color, texture, turgor normal, no rashes or lesions  Lymph nodes:   Cervical, supraclavicular, and axillary nodes normal  Neurologic:   CNII-XII intact, normal strength, sensation and reflexes    throughout          Assessment & Plan:

## 2023-11-20 NOTE — Assessment & Plan Note (Signed)
 Pt's PE WNL w/ exception of LE edema.  Will start Lasix  PRN.  UTD on colonoscopy, DEXA, PNA, flu.  Mammo scheduled.  Check labs.  Anticipatory guidance provided.

## 2023-11-20 NOTE — Patient Instructions (Signed)
 Follow up in 6 months to recheck cholesterol We'll notify you of your lab results and make any changes if needed TAKE the Meloxicam  Try and elevate your legs and make sure you're moving regularly to help pump the blood out of your legs USE the Furosemide  as needed for swelling Call with any questions or concerns Stay Safe!  Stay Healthy! Happy Early Dortha!!!

## 2023-11-21 ENCOUNTER — Ambulatory Visit: Payer: Self-pay | Admitting: Family Medicine

## 2023-11-21 LAB — HEPATITIS C ANTIBODY: Hepatitis C Ab: NONREACTIVE

## 2023-12-01 DIAGNOSIS — R928 Other abnormal and inconclusive findings on diagnostic imaging of breast: Secondary | ICD-10-CM | POA: Diagnosis not present

## 2023-12-01 LAB — HM MAMMOGRAPHY

## 2023-12-02 ENCOUNTER — Encounter: Payer: Self-pay | Admitting: Family Medicine

## 2023-12-24 DIAGNOSIS — H401123 Primary open-angle glaucoma, left eye, severe stage: Secondary | ICD-10-CM | POA: Diagnosis not present

## 2024-01-08 ENCOUNTER — Encounter: Payer: Self-pay | Admitting: Family Medicine

## 2024-02-05 ENCOUNTER — Encounter: Payer: Self-pay | Admitting: Internal Medicine

## 2024-02-05 MED ORDER — METOPROLOL SUCCINATE ER 25 MG PO TB24: 12.5000 mg | ORAL_TABLET | Freq: Every day | ORAL | 0 refills | Status: DC

## 2024-02-09 MED ORDER — METOPROLOL SUCCINATE ER 25 MG PO TB24: 12.5000 mg | ORAL_TABLET | Freq: Every day | ORAL | 0 refills | Status: AC

## 2024-02-09 NOTE — Addendum Note (Signed)
 Addended by: LORING ANDRIETTE HERO on: 02/09/2024 09:01 AM   Modules accepted: Orders

## 2024-02-10 ENCOUNTER — Other Ambulatory Visit: Payer: Self-pay | Admitting: Internal Medicine

## 2024-02-10 ENCOUNTER — Other Ambulatory Visit: Payer: Self-pay | Admitting: Family Medicine

## 2024-02-24 ENCOUNTER — Other Ambulatory Visit: Payer: Self-pay | Admitting: Internal Medicine

## 2024-02-28 NOTE — Progress Notes (Unsigned)
 "   Cardiology Office Note   Date:  02/29/2024   ID:  Denise Ewing, DOB Feb 08, 1946, MRN [id number]  PCP:  Mahlon Comer BRAVO, MD  Cardiologist:   Vina Gull, MD   FU of HL and CAD      History of Present Illness: Denise Ewing is a 79 y.o. female with a history of atypical CP, CAD (on CT scan), palpitations.   2015 CCTA (Ca score 126; Less than 50% stenosis LAD)  2015 Echo LVEF normal  2021  Monitor   SR with occasional PAC  Short runs SVT Nov 2023 LVEF 65 to 70% Nov 2023 SR   Average HR 78 bpm  Rare PACs, rare PVCs  17 episodes SVT, longest for 20 beats   Triggered events correlated with SR with SVT  Pt started on Toprol  XL 12.5 mg   I saw the pt inAug 2024   Since seen she denies palpitations.  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   atorvastatin  (LIPITOR) 10 MG tablet TAKE 1 TABLET BY MOUTH EVERY DAY   Calcium  Citrate-Vitamin D  250-200 MG-UNIT TABS Take 2 tablets by mouth at bedtime.   Coenzyme Q10 (CO Q-10 PO) Take 1 capsule by mouth every morning.   dorzolamide-timolol  (COSOPT) 22.3-6.8 MG/ML ophthalmic solution 1 drop 2 (two) times daily.   ezetimibe  (ZETIA ) 10 MG tablet TAKE 1/2 TABLET BY MOUTH EVERY DAY   famotidine  (PEPCID ) 20 MG tablet Take 20 mg by mouth daily.   furosemide  (LASIX ) 20 MG tablet Take 1 tablet (20 mg total) by mouth daily as needed.   meloxicam  (MOBIC ) 7.5 MG tablet TAKE 1 TABLET BY MOUTH ONCE DAILY   metoprolol  succinate (TOPROL -XL) 25 MG 24 hr tablet Take 0.5 tablets (12.5 mg total) by mouth daily.   Multiple Vitamins-Minerals (ALIVE WOMENS 50+ PO) Take 1 tablet by mouth every morning.   pantoprazole  (PROTONIX ) 40 MG tablet TAKE 1 TABLET BY MOUTH 2 TIMES DAILY   raloxifene  (EVISTA ) 60 MG tablet  TAKE 1 TABLET BY MOUTH EVERY DAY   TRAVATAN Z 0.004 % SOLN ophthalmic solution Place 1 drop into both eyes at bedtime.      Allergies:   Amoxicillin and Albuterol    Past Medical History:  Diagnosis Date   Allergy    post nasal drip   Arthritis    Blood transfusion without reported diagnosis    with hysterectomy 11 years ago   GERD (gastroesophageal reflux disease)    Glaucoma    bilateral eyes   History of colonic polyps    Melanoma of thigh (HCC)    Osteoporosis     Past Surgical History:  Procedure Laterality Date   Colon polyps removed     Melanoma removed     R thigh   TOTAL ABDOMINAL HYSTERECTOMY W/ BILATERAL SALPINGOOPHORECTOMY     uterine fibroids     Social History:  The patient  reports that she has never smoked. She has never used smokeless tobacco. She reports current alcohol use. She reports that she does not use drugs.   Family History:  The patient's family history includes Arrhythmia in her brother and sister; Breast cancer (age of onset: 32)  in her mother; CAD in her sister; Cancer in her mother; Hypertension in her mother; Hypothyroidism in her sister; Osteoporosis in her mother; Rectal cancer in her mother.    ROS:  Please see the history of present illness. All other systems are reviewed and  Negative to the above problem except as noted.    PHYSICAL EXAM: VS:  BP (!) 136/56 (BP Location: Left Arm, Patient Position: Sitting, Cuff Size: Normal)   Pulse 73   Ht 5' 0.5 (1.537 m)   Wt 158 lb 1.6 oz (71.7 kg)   SpO2 94%   BMI 30.37 kg/m    Pt says BP is better elsewhere   GEN: Pt is a 79 yo in no acute distress  HEENT: normal  Neck: JVP is normal  No carotid bruits Cardiac: RRR; no murmurs Respiratory:  clear to auscultation GI: soft, nontender,  No hepatomegaly  Ext No LE edema   EKG:  EKG is ordered today.    NSR 73 bpm   Lipid Panel    Component Value Date/Time   CHOL 128 11/20/2023 1319   CHOL 110 04/10/2016 1143   CHOL 96 (L)  12/21/2014 0824   TRIG 129.0 11/20/2023 1319   TRIG 66 12/21/2014 0824   HDL 51.70 11/20/2023 1319   HDL 54 04/10/2016 1143   HDL 45 (L) 12/21/2014 0824   CHOLHDL 2 11/20/2023 1319   VLDL 25.8 11/20/2023 1319   LDLCALC 51 11/20/2023 1319   LDLCALC 45 04/10/2016 1143   LDLCALC 38 12/21/2014 0824      Wt Readings from Last 3 Encounters:  02/29/24 158 lb 1.6 oz (71.7 kg)  11/20/23 158 lb (71.7 kg)  08/25/23 156 lb (70.8 kg)      ASSESSMENT AND PLAN:   1  CAD   CCTA in 2015 shows mild CAD  Pt denies symptoms   2  Palpitations   Pt denies   Keep on Toprol  XL (low dose)  3 HL  Last LDL 51, HDL 52  Trig 129   Keep on same regimen  4  HTN   BP is better at home  Follow   5 Metabolics   Would recomm A1C when she gets labs again.    Folllow up in 1 year  Current medicines are reviewed at length with the patient today.  The patient does not have concerns regarding medicines.  Signed, Vina Gull, MD  02/29/2024 4:17 PM    [facility] [address] [place], [place], [place]  [address] Phone: [phone]; Fax: [phone]    "

## 2024-02-29 ENCOUNTER — Encounter: Payer: Self-pay | Admitting: Internal Medicine

## 2024-02-29 ENCOUNTER — Ambulatory Visit: Attending: Internal Medicine | Admitting: Internal Medicine

## 2024-02-29 VITALS — BP 136/56 | HR 73 | Ht 60.5 in | Wt 158.1 lb

## 2024-02-29 DIAGNOSIS — I2510 Atherosclerotic heart disease of native coronary artery without angina pectoris: Secondary | ICD-10-CM | POA: Diagnosis not present

## 2024-02-29 NOTE — Patient Instructions (Signed)
 Medication Instructions:  Your physician recommends that you continue on your current medications as directed. Please refer to the Current Medication list given to you today.   *If you need a refill on your cardiac medications before your next appointment, please call your pharmacy*  Lab Work: Hemoglobin A1c  If you have labs (blood work) drawn today and your tests are completely normal, you will receive your results only by: MyChart Message (if you have MyChart) OR A paper copy in the mail If you have any lab test that is abnormal or we need to change your treatment, we will call you to review the results.  Testing/Procedures: NONE   Follow-Up: At [facility], you and your health needs are our priority.  As part of our continuing mission to provide you with exceptional heart care, our providers are all part of one team.  This team includes your primary Cardiologist (physician) and Advanced Practice Providers or APPs (Physician Assistants and Nurse Practitioners) who all work together to provide you with the care you need, when you need it.  Your next appointment:   1 year(s)  Provider:   Vina Gull, MD

## 2024-03-23 ENCOUNTER — Other Ambulatory Visit: Payer: Self-pay | Admitting: Internal Medicine

## 2024-03-25 NOTE — Telephone Encounter (Signed)
 Medication refilled.
